# Patient Record
Sex: Female | Born: 1990 | Hispanic: Yes | Marital: Single | Smoking: Never smoker
Health system: Southern US, Community
[De-identification: ages and names within clinical notes are randomized; demographics above are authoritative.]

## PROBLEM LIST (undated history)

## (undated) ENCOUNTER — Inpatient Hospital Stay (HOSPITAL_COMMUNITY): Payer: Self-pay

## (undated) DIAGNOSIS — F53 Postpartum depression: Secondary | ICD-10-CM

## (undated) DIAGNOSIS — J302 Other seasonal allergic rhinitis: Secondary | ICD-10-CM

## (undated) DIAGNOSIS — J45909 Unspecified asthma, uncomplicated: Secondary | ICD-10-CM

## (undated) DIAGNOSIS — R51 Headache: Secondary | ICD-10-CM

## (undated) DIAGNOSIS — D649 Anemia, unspecified: Secondary | ICD-10-CM

## (undated) DIAGNOSIS — A599 Trichomoniasis, unspecified: Secondary | ICD-10-CM

## (undated) DIAGNOSIS — A749 Chlamydial infection, unspecified: Secondary | ICD-10-CM

## (undated) DIAGNOSIS — K219 Gastro-esophageal reflux disease without esophagitis: Secondary | ICD-10-CM

## (undated) HISTORY — PX: NO PAST SURGERIES: SHX2092

## (undated) HISTORY — DX: Postpartum depression: F53.0

## (undated) HISTORY — PX: WISDOM TOOTH EXTRACTION: SHX21

---
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
## 2012-03-24 ENCOUNTER — Emergency Department (HOSPITAL_COMMUNITY): Payer: Self-pay

## 2012-03-24 ENCOUNTER — Encounter (HOSPITAL_COMMUNITY): Payer: Self-pay | Admitting: Emergency Medicine

## 2012-03-24 ENCOUNTER — Emergency Department (HOSPITAL_COMMUNITY)
Admission: EM | Admit: 2012-03-24 | Discharge: 2012-03-24 | Discharge status: Against Medical Advice | Payer: Self-pay | Attending: Emergency Medicine | Admitting: Emergency Medicine

## 2012-03-24 DIAGNOSIS — Z3490 Encounter for supervision of normal pregnancy, unspecified, unspecified trimester: Secondary | ICD-10-CM

## 2012-03-24 DIAGNOSIS — Z711 Person with feared health complaint in whom no diagnosis is made: Secondary | ICD-10-CM

## 2012-03-24 DIAGNOSIS — J45909 Unspecified asthma, uncomplicated: Secondary | ICD-10-CM | POA: Insufficient documentation

## 2012-03-24 DIAGNOSIS — Z7251 High risk heterosexual behavior: Secondary | ICD-10-CM | POA: Insufficient documentation

## 2012-03-24 DIAGNOSIS — N390 Urinary tract infection, site not specified: Secondary | ICD-10-CM

## 2012-03-24 DIAGNOSIS — N898 Other specified noninflammatory disorders of vagina: Secondary | ICD-10-CM | POA: Insufficient documentation

## 2012-03-24 DIAGNOSIS — R300 Dysuria: Secondary | ICD-10-CM | POA: Insufficient documentation

## 2012-03-24 DIAGNOSIS — O2390 Unspecified genitourinary tract infection in pregnancy, unspecified trimester: Secondary | ICD-10-CM | POA: Insufficient documentation

## 2012-03-24 DIAGNOSIS — Z3201 Encounter for pregnancy test, result positive: Secondary | ICD-10-CM | POA: Insufficient documentation

## 2012-03-24 DIAGNOSIS — O9989 Other specified diseases and conditions complicating pregnancy, childbirth and the puerperium: Secondary | ICD-10-CM | POA: Insufficient documentation

## 2012-03-24 HISTORY — DX: Unspecified asthma, uncomplicated: J45.909

## 2012-03-24 LAB — CBC WITH DIFFERENTIAL/PLATELET
Basophils Relative: 0 % (ref 0–1)
HCT: 31.9 % — ABNORMAL LOW (ref 36.0–46.0)
Hemoglobin: 10.2 g/dL — ABNORMAL LOW (ref 12.0–15.0)
Lymphs Abs: 2 10*3/uL (ref 0.7–4.0)
MCH: 24.9 pg — ABNORMAL LOW (ref 26.0–34.0)
MCHC: 32 g/dL (ref 30.0–36.0)
Monocytes Absolute: 0.5 10*3/uL (ref 0.1–1.0)
Monocytes Relative: 7 % (ref 3–12)
Neutro Abs: 4.7 10*3/uL (ref 1.7–7.7)

## 2012-03-24 LAB — URINE MICROSCOPIC-ADD ON

## 2012-03-24 LAB — COMPREHENSIVE METABOLIC PANEL
Albumin: 3.9 g/dL (ref 3.5–5.2)
BUN: 10 mg/dL (ref 6–23)
Chloride: 103 mEq/L (ref 96–112)
Creatinine, Ser: 0.52 mg/dL (ref 0.50–1.10)
GFR calc Af Amer: >90 mL/min (ref >90)
GFR calc non Af Amer: >90 mL/min (ref >90)
Glucose, Bld: 103 mg/dL — ABNORMAL HIGH (ref 70–99)
Total Bilirubin: 0.3 mg/dL (ref 0.3–1.2)

## 2012-03-24 LAB — URINALYSIS, ROUTINE W REFLEX MICROSCOPIC
Bilirubin Urine: NEGATIVE
Glucose, UA: NEGATIVE mg/dL
Ketones, ur: 15 mg/dL — AB
pH: 5.5 (ref 5.0–8.0)

## 2012-03-24 LAB — POCT PREGNANCY, URINE: Preg Test, Ur: POSITIVE — AB

## 2012-03-24 MED ORDER — CEFTRIAXONE SODIUM 250 MG IJ SOLR
250.0000 mg | Freq: Once | INTRAMUSCULAR | Status: AC
  Administered 2012-03-24: 250 mg via INTRAMUSCULAR
  Filled 2012-03-24: qty 250

## 2012-03-24 MED ORDER — PRENATAL COMPLETE 14-0.4 MG PO TABS: 1.0000 | ORAL_TABLET | Freq: Two times a day (BID) | ORAL | Status: DC

## 2012-03-24 MED ORDER — NITROFURANTOIN MONOHYD MACRO 100 MG PO CAPS: 100.0000 mg | ORAL_CAPSULE | Freq: Two times a day (BID) | ORAL | Status: DC

## 2012-03-24 MED ORDER — AZITHROMYCIN 250 MG PO TABS
500.0000 mg | ORAL_TABLET | Freq: Once | ORAL | Status: AC
  Administered 2012-03-24: 500 mg via ORAL
  Filled 2012-03-24: qty 2

## 2012-03-24 NOTE — ED Notes (Signed)
Pt updated on wait times 

## 2012-03-24 NOTE — ED Notes (Signed)
Child given Apple juice and crackers, updated on wait time

## 2012-03-24 NOTE — ED Notes (Signed)
Pt c/o abdominal pain x 1 week. Pt also vaginal irritation onset x [redacted] week along with yellow vaginal discharge.

## 2012-03-24 NOTE — ED Provider Notes (Signed)
Medical screening examination/treatment/procedure(s) were performed by non-physician practitioner and as supervising physician I was immediately available for consultation/collaboration.   Loren Racer, MD 03/24/12 2352

## 2012-03-24 NOTE — ED Provider Notes (Signed)
History     CSN: [id number]  Arrival date & time 03/24/12  1435   First MD Initiated Contact with Patient 03/24/12 1912      Chief Complaint  Patient presents with  . Abdominal Pain    (Consider location/radiation/quality/duration/timing/severity/associated sxs/prior treatment) HPI  Pt to the ER with complaints of low abdominal pain, vaginal discharge and dysuria for 1 week. Pt has irregular periods, last period in November, does not take birth control, does not always use protection with her boyfriend. She denies being pregnant to her knowledge at this time.  Denies N/V/D weakness or fevers. nad vss  Past Medical History  Diagnosis Date  . Asthma     History reviewed. No pertinent past surgical history.  No family history on file.  History  Substance Use Topics  . Smoking status: Never Smoker   . Smokeless tobacco: Not on file  . Alcohol Use: Yes    OB History   Grav Para Term Preterm Abortions TAB SAB Ect Mult Living                  Review of Systems  Review of Systems  Gen: no weight loss, fevers, chills, night sweats  Eyes: no discharge or drainage, no occular pain or visual changes  Nose: no epistaxis or rhinorrhea  Mouth: no dental pain, no sore throat  Neck: no neck pain  Lungs:No wheezing, coughing or hemoptysis CV: no chest pain, palpitations, dependent edema or orthopnea  Abd: no abdominal pain, nausea, vomiting, + abdominal cramping GU: + dysuria and vaginal discharge no gross hematuria  MSK:  No abnormalities  Neuro: no headache, no focal neurologic deficits  Skin: no abnormalities Psyche: negative.   Allergies  Shellfish allergy  Home Medications   Current Outpatient Rx  Name  Route  Sig  Dispense  Refill  . ibuprofen (ADVIL,MOTRIN) 200 MG tablet   Oral   Take 800 mg by mouth every 6 (six) hours as needed for pain. For pain         . nitrofurantoin, macrocrystal-monohydrate, (MACROBID) 100 MG capsule   Oral   Take 1 capsule (100  mg total) by mouth 2 (two) times daily.   10 capsule   0   . Prenatal Vit-Fe Fumarate-FA (PRENATAL COMPLETE) 14-0.4 MG TABS   Oral   Take 1 tablet by mouth 2 (two) times daily.   60 each   2     BP 135/74  Pulse 95  Temp(Src) 98.6 F (37 C) (Oral)  Resp 16  SpO2 100%  LMP 01/13/2012  Physical Exam  Nursing note and vitals reviewed. Constitutional: She appears well-developed and well-nourished. No distress.  HENT:  Head: Normocephalic and atraumatic.  Eyes: Pupils are equal, round, and reactive to light.  Neck: Normal range of motion. Neck supple.  Cardiovascular: Normal rate and regular rhythm.   Pulmonary/Chest: Effort normal.  Abdominal: Soft.    Genitourinary: Uterus is enlarged and tender. Cervix exhibits discharge. Cervix exhibits no motion tenderness and no friability. No bleeding around the vagina. No foreign body around the vagina. Vaginal discharge found.  Neurological: She is alert.  Skin: Skin is warm and dry.    ED Course  Procedures (including critical care time)  Labs Reviewed  URINALYSIS, ROUTINE W REFLEX MICROSCOPIC - Abnormal; Notable for the following:    Color, Urine AMBER (*)    APPearance CLOUDY (*)    Specific Gravity, Urine 1.044 (*)    Ketones, ur 15 (*)    Leukocytes,  UA LARGE (*)    All other components within normal limits  CBC WITH DIFFERENTIAL - Abnormal; Notable for the following:    Hemoglobin 10.2 (*)    HCT 31.9 (*)    MCV 77.8 (*)    MCH 24.9 (*)    All other components within normal limits  COMPREHENSIVE METABOLIC PANEL - Abnormal; Notable for the following:    Potassium 3.4 (*)    Glucose, Bld 103 (*)    All other components within normal limits  URINE MICROSCOPIC-ADD ON - Abnormal; Notable for the following:    Squamous Epithelial / LPF MANY (*)    Bacteria, UA FEW (*)    All other components within normal limits  HCG, QUANTITATIVE, PREGNANCY - Abnormal; Notable for the following:    hCG, Beta Chain, Quant, S 9339 (*)     All other components within normal limits  POCT PREGNANCY, URINE - Abnormal; Notable for the following:    Preg Test, Ur POSITIVE (*)    All other components within normal limits  URINE CULTURE  GC/CHLAMYDIA PROBE AMP  WET PREP, GENITAL   US Ob Comp Less 14 Wks  03/24/2012  *RADIOLOGY REPORT*  Clinical Data: Left abdominal pain, pregnant  OBSTETRIC <14 WK [place] AND TRANSVAGINAL OB US  Technique:  Both transabdominal and transvaginal ultrasound examinations were performed for complete evaluation of the gestation as well as the maternal uterus, adnexal regions, and pelvic cul-de-sac.  Transvaginal technique was performed to assess early pregnancy.  Comparison:  None.  Intrauterine gestational sac:  Visualized/normal in shape. Yolk sac: Present Embryo: Likely present but difficult to discretely measure. Cardiac Activity: Cardiac activity is suspected on cine imaging but could not be confirmed with M-mode to document a FHR.  MSD: 19 mm  6 w 6 d [place] EDC: 11/11/2012  Maternal uterus/adnexae: No subchronic hemorrhage.  Left ovary is within normal limits, measuring 1.4 x 2.9 x 1.7 cm.  Right ovary is within normal limits, measuring 1.3 x 2.5 x 1.5 cm.  Trace pelvic fluid.  IMPRESSION: Single intrauterine gestation with estimated gestational age [redacted] weeks 6 days by mean sac diameter.  A suspected fetal pole is present but is difficult to discretely measure.  Consider follow-up ultrasound in 10-14 days to document viability as clinically warranted.   Original Report Authenticated By: Charline Bills, M.D.    US Ob Transvaginal  03/24/2012  *RADIOLOGY REPORT*  Clinical Data: Left abdominal pain, pregnant  OBSTETRIC <14 WK [place] AND TRANSVAGINAL OB US  Technique:  Both transabdominal and transvaginal ultrasound examinations were performed for complete evaluation of the gestation as well as the maternal uterus, adnexal regions, and pelvic cul-de-sac.  Transvaginal technique was performed to assess early pregnancy.   Comparison:  None.  Intrauterine gestational sac:  Visualized/normal in shape. Yolk sac: Present Embryo: Likely present but difficult to discretely measure. Cardiac Activity: Cardiac activity is suspected on cine imaging but could not be confirmed with M-mode to document a FHR.  MSD: 19 mm  6 w 6 d [place] EDC: 11/11/2012  Maternal uterus/adnexae: No subchronic hemorrhage.  Left ovary is within normal limits, measuring 1.4 x 2.9 x 1.7 cm.  Right ovary is within normal limits, measuring 1.3 x 2.5 x 1.5 cm.  Trace pelvic fluid.  IMPRESSION: Single intrauterine gestation with estimated gestational age [redacted] weeks 6 days by mean sac diameter.  A suspected fetal pole is present but is difficult to discretely measure.  Consider follow-up ultrasound in 10-14 days to document viability as  clinically warranted.   Original Report Authenticated By: Charline Bills, M.D.      1. UTI (lower urinary tract infection)   2. Pregnancy   3. Concern about STD in female without diagnosis       MDM   Agents urine pregnant positive. Ultrasound confirms a 6 week and 6 day IUP. She is positive for urinary tract infection and has significant discharge in her vaginal vault. Patient unwilling to wait results of her wet prep because her mother and sister are waiting for her and getting antsy in the waiting room. I discussed with her the risks but she still wants to leave. I advised her that her insurance may not pay for the she signed out AMA she still wants to leave.  Terramycin and Rocephin given in the emergency department and a prescription for prenatal vitamins and Macrobid given as prescriptions. Urine culture sent out.  Pt has been advised of the symptoms that warrant their return to the ED. Patient has voiced understanding and has agreed to follow-up with the PCP or specialist.     10:10pm- pt left after receiving rocephin and azithromycin in ED but did not take any discharge paper work Rx for abx and prenatals that I  prescribed. She is concerned about her mom finding out she is pregnant. Had mentioned in the room earlier that she may try to come back tomorrow for results.    Dorthula Matas, PA 03/24/12 2211

## 2012-03-24 NOTE — ED Notes (Signed)
Patient transported to Ultrasound 

## 2012-03-24 NOTE — ED Notes (Signed)
Pt states that she has to leave right now and cannot wait for her results. Pt reports that she will return tomorrow. Pt informed of risks of leaving and states she understands Pt dressed and refused last set of v/s saying that her ride would leave if she did not go now.

## 2012-03-26 LAB — URINE CULTURE: Colony Count: 30000

## 2012-03-27 LAB — GC/CHLAMYDIA PROBE AMP
CT Probe RNA: NEGATIVE
GC Probe RNA: NEGATIVE

## 2012-04-03 ENCOUNTER — Emergency Department (HOSPITAL_COMMUNITY)
Admission: EM | Admit: 2012-04-03 | Discharge: 2012-04-03 | Disposition: A | Discharge status: Home / Self Care | Payer: Self-pay | Attending: Emergency Medicine | Admitting: Emergency Medicine

## 2012-04-03 ENCOUNTER — Encounter (HOSPITAL_COMMUNITY): Payer: Self-pay | Admitting: Emergency Medicine

## 2012-04-03 DIAGNOSIS — N898 Other specified noninflammatory disorders of vagina: Secondary | ICD-10-CM | POA: Insufficient documentation

## 2012-04-03 DIAGNOSIS — N939 Abnormal uterine and vaginal bleeding, unspecified: Secondary | ICD-10-CM

## 2012-04-03 DIAGNOSIS — O2390 Unspecified genitourinary tract infection in pregnancy, unspecified trimester: Secondary | ICD-10-CM | POA: Insufficient documentation

## 2012-04-03 DIAGNOSIS — O9989 Other specified diseases and conditions complicating pregnancy, childbirth and the puerperium: Secondary | ICD-10-CM | POA: Insufficient documentation

## 2012-04-03 DIAGNOSIS — R109 Unspecified abdominal pain: Secondary | ICD-10-CM | POA: Insufficient documentation

## 2012-04-03 DIAGNOSIS — R110 Nausea: Secondary | ICD-10-CM | POA: Insufficient documentation

## 2012-04-03 DIAGNOSIS — O209 Hemorrhage in early pregnancy, unspecified: Secondary | ICD-10-CM | POA: Insufficient documentation

## 2012-04-03 DIAGNOSIS — Z792 Long term (current) use of antibiotics: Secondary | ICD-10-CM | POA: Insufficient documentation

## 2012-04-03 DIAGNOSIS — A599 Trichomoniasis, unspecified: Secondary | ICD-10-CM | POA: Insufficient documentation

## 2012-04-03 DIAGNOSIS — O99330 Smoking (tobacco) complicating pregnancy, unspecified trimester: Secondary | ICD-10-CM | POA: Insufficient documentation

## 2012-04-03 DIAGNOSIS — E669 Obesity, unspecified: Secondary | ICD-10-CM | POA: Insufficient documentation

## 2012-04-03 DIAGNOSIS — J45909 Unspecified asthma, uncomplicated: Secondary | ICD-10-CM | POA: Insufficient documentation

## 2012-04-03 LAB — COMPREHENSIVE METABOLIC PANEL
ALT: 11 U/L (ref 0–35)
AST: 15 U/L (ref 0–37)
Albumin: 4.1 g/dL (ref 3.5–5.2)
CO2: 23 mEq/L (ref 19–32)
Chloride: 101 mEq/L (ref 96–112)
GFR calc non Af Amer: >90 mL/min (ref >90)
Sodium: 137 mEq/L (ref 135–145)
Total Bilirubin: 0.4 mg/dL (ref 0.3–1.2)

## 2012-04-03 LAB — CBC WITH DIFFERENTIAL/PLATELET
Eosinophils Absolute: 0.1 10*3/uL (ref 0.0–0.7)
Eosinophils Relative: 1 % (ref 0–5)
HCT: 34.2 % — ABNORMAL LOW (ref 36.0–46.0)
Lymphocytes Relative: 34 % (ref 12–46)
Lymphs Abs: 2.2 10*3/uL (ref 0.7–4.0)
MCH: 25.4 pg — ABNORMAL LOW (ref 26.0–34.0)
MCV: 77.6 fL — ABNORMAL LOW (ref 78.0–100.0)
Monocytes Absolute: 0.4 10*3/uL (ref 0.1–1.0)
Platelets: 260 10*3/uL (ref 150–400)
RBC: 4.41 MIL/uL (ref 3.87–5.11)
WBC: 6.6 10*3/uL (ref 4.0–10.5)

## 2012-04-03 LAB — URINALYSIS, ROUTINE W REFLEX MICROSCOPIC
Glucose, UA: NEGATIVE mg/dL
Specific Gravity, Urine: 1.025 (ref 1.005–1.030)
pH: 5.5 (ref 5.0–8.0)

## 2012-04-03 LAB — WET PREP, GENITAL: Yeast Wet Prep HPF POC: NONE SEEN

## 2012-04-03 LAB — URINE MICROSCOPIC-ADD ON

## 2012-04-03 LAB — PREGNANCY, URINE: Preg Test, Ur: POSITIVE — AB

## 2012-04-03 LAB — HCG, QUANTITATIVE, PREGNANCY: hCG, Beta Chain, Quant, S: 4048 m[IU]/mL — ABNORMAL HIGH (ref <5)

## 2012-04-03 MED ORDER — METRONIDAZOLE 500 MG PO TABS: 500.0000 mg | ORAL_TABLET | Freq: Two times a day (BID) | ORAL | Status: DC

## 2012-04-03 NOTE — ED Notes (Signed)
Pt. Stated, I've been having abd. Pain for over a week since last week, came here last week but I was not able to stay cause I didn't have a ride.  I m pregnant but never seen a Dr. Maurice Small Nov.28, I started bleeding Sunday like I was having a period.

## 2012-04-03 NOTE — ED Provider Notes (Signed)
I have personally seen and examined the patient.  I have discussed the plan of care with the resident.  I have reviewed the documentation on PMH/FH/Soc. History.  I have reviewed the documentation of the resident and agree.  Pt well appearing, using phone and in no distress, abdominal exam benign.  Suspect threatened miscarriage She has already had IUP confirmed by [place]  Joya Gaskins, MD 04/03/12 1141

## 2012-04-03 NOTE — ED Provider Notes (Signed)
History     CSN: [id number]  Arrival date & time 04/03/12  5284   First MD Initiated Contact with Patient 04/03/12 [phone]      Chief Complaint  Patient presents with  . Abdominal Pain    (Consider location/radiation/quality/duration/timing/severity/associated sxs/prior treatment) HPI Comments: 22 y.o G1P0 presents for 2 days of vaginal bleeding (2/17 bleeding heavy needed to wear a pad, 2/18 spotting).  LMP 12/2011 with h/o irregular menses.  Having unprotected sex with 1 partner boyfriend.  Visited ED 2/8 found to be 6 weeks 6 days pregnancy with quant >9K.  Also having left upper quadrant pain 6/10 and periumbilical pain.  Wet prep 2/8 +few yeast, +trichomonas (was not treated at that visit).  She was given rocephin and AZM 2/8 and Macrobid for suspected UTI.    PMH: asthma, allergies [facility] none SH: in college at [employer]; wants to do social work and transfer to [employer]  The history is provided by the patient. No language interpreter was used.    Past Medical History  Diagnosis Date  . Asthma     History reviewed. No pertinent past surgical history.  No family history on file.  History  Substance Use Topics  . Smoking status: Never Smoker   . Smokeless tobacco: Not on file  . Alcohol Use: Yes    OB History   Grav Para Term Preterm Abortions TAB SAB Ect Mult Living                  Review of Systems  Constitutional: Negative for fever and appetite change.  Gastrointestinal: Positive for nausea and abdominal pain.  Genitourinary: Positive for vaginal bleeding.  All other systems reviewed and are negative.    Allergies  Shellfish allergy  Home Medications   Current Outpatient Rx  Name  Route  Sig  Dispense  Refill  . acetaminophen (TYLENOL) 500 MG tablet   Oral   Take 500 mg by mouth every 6 (six) hours as needed (for headache).         . metroNIDAZOLE (FLAGYL) 500 MG tablet   Oral   Take 1 tablet (500 mg total) by mouth 2 (two) times daily.   14 tablet    0     BP 120/66  Pulse 84  Temp(Src) 98.3 F (36.8 C) (Oral)  Resp 15  SpO2 100%  LMP 01/13/2012  Physical Exam  Nursing note and vitals reviewed. Constitutional: She is oriented to person, place, and time. Vital signs are normal. She appears well-developed and well-nourished. She is cooperative. No distress.  HENT:  Head: Normocephalic and atraumatic.  Mouth/Throat: Oropharynx is clear and moist and mucous membranes are normal. No oropharyngeal exudate.  Eyes: Conjunctivae are normal. Pupils are equal, round, and reactive to light. Right eye exhibits no discharge. Left eye exhibits no discharge. No scleral icterus.  Cardiovascular: Normal rate, regular rhythm, S1 normal, S2 normal and normal heart sounds.   No murmur heard. Pulmonary/Chest: Effort normal and breath sounds normal. No respiratory distress. She has no wheezes.  Abdominal: Soft. Bowel sounds are normal. There is tenderness in the left upper quadrant.  Mild ttp LUQ Obese ab  Genitourinary: Pelvic exam was performed with patient supine. There is no lesion on the right labia. There is no lesion on the left labia. Cervix exhibits motion tenderness and discharge. Cervix exhibits no friability.  Closed os Min. Blood coming from os  Fishy odor  Clear thin d/c from os   Musculoskeletal: She exhibits no edema.  Neurological: She is alert and oriented to person, place, and time. She has normal strength.  Skin: Skin is warm, dry and intact. No rash noted. She is not diaphoretic.  Psychiatric: She has a normal mood and affect. Her speech is normal and behavior is normal. Judgment and thought content normal. Cognition and memory are normal.    ED Course  Procedures (including critical care time)  Labs Reviewed  WET PREP, GENITAL - Abnormal; Notable for the following:    Trich, Wet Prep TOO NUMEROUS TO COUNT (*)    WBC, Wet Prep HPF POC TOO NUMEROUS TO COUNT (*)    All other components within normal limits  URINALYSIS,  ROUTINE W REFLEX MICROSCOPIC - Abnormal; Notable for the following:    APPearance HAZY (*)    Hgb urine dipstick LARGE (*)    Leukocytes, UA MODERATE (*)    All other components within normal limits  PREGNANCY, URINE - Abnormal; Notable for the following:    Preg Test, Ur POSITIVE (*)    All other components within normal limits  CBC WITH DIFFERENTIAL - Abnormal; Notable for the following:    Hemoglobin 11.2 (*)    HCT 34.2 (*)    MCV 77.6 (*)    MCH 25.4 (*)    RDW 15.6 (*)    All other components within normal limits  URINE MICROSCOPIC-ADD ON - Abnormal; Notable for the following:    Squamous Epithelial / LPF MANY (*)    Bacteria, UA FEW (*)    All other components within normal limits  GC/CHLAMYDIA PROBE AMP  URINE CULTURE  COMPREHENSIVE METABOLIC PANEL  LIPASE, BLOOD  HCG, QUANTITATIVE, PREGNANCY  ABO/RH   No results found.   1. Vaginal bleeding   2. Trichomonas   3. Abdominal pain       MDM  Wet prep,GC, Rh factor, beta hcg Partner needs to be treated  Will treat trich Flagyl 500 bid x 7 days per Dr[facility] for beta quant F/u Dr. Ladean Raya- call [phone]  [facility] MD [phone]        Annett Gula, MD 04/03/12 1049  Annett Gula, MD 04/03/12 [phone]

## 2012-04-04 LAB — GC/CHLAMYDIA PROBE AMP
CT Probe RNA: NEGATIVE
GC Probe RNA: NEGATIVE

## 2012-04-05 ENCOUNTER — Inpatient Hospital Stay (HOSPITAL_COMMUNITY)
Admission: AD | Admit: 2012-04-05 | Discharge: 2012-04-05 | Disposition: A | Discharge status: Home / Self Care | Payer: Self-pay | Source: Ambulatory Visit | Attending: Obstetrics & Gynecology | Admitting: Obstetrics & Gynecology

## 2012-04-05 ENCOUNTER — Encounter (HOSPITAL_COMMUNITY): Payer: Self-pay | Admitting: *Deleted

## 2012-04-05 DIAGNOSIS — O030 Genital tract and pelvic infection following incomplete spontaneous abortion: Secondary | ICD-10-CM | POA: Insufficient documentation

## 2012-04-05 DIAGNOSIS — A5901 Trichomonal vulvovaginitis: Secondary | ICD-10-CM

## 2012-04-05 DIAGNOSIS — O032 Embolism following incomplete spontaneous abortion: Secondary | ICD-10-CM

## 2012-04-05 HISTORY — DX: Other seasonal allergic rhinitis: J30.2

## 2012-04-05 LAB — HCG, QUANTITATIVE, PREGNANCY: hCG, Beta Chain, Quant, S: 1238 m[IU]/mL — ABNORMAL HIGH (ref <5)

## 2012-04-05 MED ORDER — MISOPROSTOL 200 MCG PO TABS: 200.0000 ug | ORAL_TABLET | ORAL | Status: DC

## 2012-04-05 MED ORDER — OXYCODONE-ACETAMINOPHEN 5-325 MG PO TABS
1.0000 | ORAL_TABLET | Freq: Once | ORAL | Status: AC
  Administered 2012-04-05: 1 via ORAL
  Filled 2012-04-05: qty 1

## 2012-04-05 MED ORDER — IBUPROFEN 600 MG PO TABS: 600.0000 mg | ORAL_TABLET | Freq: Four times a day (QID) | ORAL | Status: DC | PRN

## 2012-04-05 MED ORDER — OXYCODONE-ACETAMINOPHEN 5-325 MG PO TABS: 1.0000 | ORAL_TABLET | Freq: Four times a day (QID) | ORAL | Status: DC | PRN

## 2012-04-05 MED ORDER — OXYCODONE-ACETAMINOPHEN 5-325 MG PO TABS: 1.0000 | ORAL_TABLET | ORAL | Status: DC | PRN

## 2012-04-05 NOTE — MAU Note (Signed)
Pt states was seen at ER 2 days ago, here for increased pain and bleeding. Has not filled rx for treatment of STI, states will fill today.

## 2012-04-05 NOTE — MAU Provider Note (Signed)
Chief Complaint  Patient presents with  . Vaginal Bleeding    Subjective Lindsay Perez 22 y.o.  G1P0 at [redacted]w[redacted]d by LMP presents with onset 4 d ago of first episode of small amount pink vaginal bleeding. Progressively redder and heavier, using 4 pads per day. Seen at [facility] for abd pain 12 d ago with US showing IUP [redacted]w[redacted]d with "suspected" fetal pole, no definite HR. Seen again 2 days ago at [facility]. Quant was 4048, Opos, GC/CT neg.Hgb 11.2, CMP normal.  WP pos for trich (not yet treated). Has menstrual-like crampy lower abdominal pain, also getting worse today. No dysuria or hematuria.   Problem list, past medical history, Ob/Gyn history, surgical history, family history and social history reviewed and updated as appropriate. Pertinent Medical History:asthma, trich Pertinent Ob/Gyn History: G1 NPC Pertinent Surgical History: None  Prescriptions prior to admission  Medication Sig Dispense Refill  . acetaminophen (TYLENOL) 500 MG tablet Take 500 mg by mouth every 6 (six) hours as needed (for headache).      Marland Kitchen albuterol (PROVENTIL HFA;VENTOLIN HFA) 108 (90 BASE) MCG/ACT inhaler Inhale 2 puffs into the lungs every 4 (four) hours as needed for wheezing.      Marland Kitchen aspirin 325 MG tablet Take 650 mg by mouth every 4 (four) hours as needed for pain.      Marland Kitchen OVER THE COUNTER MEDICATION Apply 1 application topically 2 (two) times daily as needed. Pt stated the she used a gel for tooth pain a few times        Allergies  Allergen Reactions  . Shellfish Allergy Swelling     Objective   Filed Vitals:   04/05/12 0945  BP: 116/65  Pulse: 68  Temp: 98.4 F (36.9 C)  Resp: 16     Physical Exam General: WN/WD in NAD  Abdom: soft, NT External genitalia: normal; BUS neg  SSE: small amount blood; cervix with small clot spontaneously extruding from os; no lesions, small BRB Bimanual: Cervix ftp ext ,uterus sl tender and 4-6 wk size adnexa nontender, no masses   Lab Results No results found for this or  any previous visit (from the past 24 hour(s)). Results for orders placed during the hospital encounter of 04/05/12 (from the past 24 hour(s))  HCG, QUANTITATIVE, PREGNANCY     Status: Abnormal   Collection Time    04/05/12 11:00 AM      Result Value Range   hCG, Beta Chain, Quant, S 1238 (*) <5 mIU/mL   Ultrasound  No results found.  Assessment 1. Incomplete miscarriage with blood clot   2. Trichomonal vaginitis      Plan    D/W Dr. Debroah Loop offer cytotec or expectant management. Couple elects cytotec Discharge home See AVS for pt education   Medication List    STOP taking these medications       acetaminophen 500 MG tablet  Commonly known as:  TYLENOL     aspirin 325 MG tablet      TAKE these medications       albuterol 108 (90 BASE) MCG/ACT inhaler  Commonly known as:  PROVENTIL HFA;VENTOLIN HFA  Inhale 2 puffs into the lungs every 4 (four) hours as needed for wheezing.     ibuprofen 600 MG tablet  Commonly known as:  ADVIL,MOTRIN  Take 1 tablet (600 mg total) by mouth every 6 (six) hours as needed for pain.     misoprostol 200 MCG tablet  Commonly known as:  CYTOTEC  Take 1 tablet (200 mcg  total) by mouth 1 day or 1 dose.     OVER THE COUNTER MEDICATION  Apply 1 application topically 2 (two) times daily as needed. Pt stated the she used a gel for tooth pain a few times     oxyCODONE-acetaminophen 5-325 MG per tablet  Commonly known as:  PERCOCET/ROXICET  Take 1 tablet by mouth every 4 (four) hours as needed for pain.       Fill Rx for trichimonas and take as directed Some from [facility] hospital GYN clinic will call you with an appointment for followup.  Lindsay Perez 04/05/2012 11:00 AM

## 2012-04-10 ENCOUNTER — Encounter: Payer: Self-pay | Admitting: Medical

## 2012-04-20 ENCOUNTER — Encounter: Payer: Self-pay | Admitting: Medical

## 2012-04-20 ENCOUNTER — Telehealth: Payer: Self-pay | Admitting: Medical

## 2012-04-20 NOTE — Telephone Encounter (Signed)
Called patient to see if she was coming to her appointment today because of bad weather. Patient stated she has classes, and would call back at a later time.

## 2012-09-12 ENCOUNTER — Encounter (HOSPITAL_COMMUNITY): Payer: Self-pay | Admitting: *Deleted

## 2012-09-12 ENCOUNTER — Inpatient Hospital Stay (HOSPITAL_COMMUNITY)
Admission: AD | Admit: 2012-09-12 | Discharge: 2012-09-12 | Disposition: A | Discharge status: Home / Self Care | Payer: Self-pay | Source: Ambulatory Visit | Attending: Obstetrics & Gynecology | Admitting: Obstetrics & Gynecology

## 2012-09-12 DIAGNOSIS — R1013 Epigastric pain: Secondary | ICD-10-CM | POA: Insufficient documentation

## 2012-09-12 DIAGNOSIS — K219 Gastro-esophageal reflux disease without esophagitis: Secondary | ICD-10-CM | POA: Insufficient documentation

## 2012-09-12 HISTORY — DX: Anemia, unspecified: D64.9

## 2012-09-12 HISTORY — DX: Headache: R51

## 2012-09-12 LAB — URINALYSIS, ROUTINE W REFLEX MICROSCOPIC
Leukocytes, UA: NEGATIVE
Nitrite: NEGATIVE
Specific Gravity, Urine: 1.02 (ref 1.005–1.030)
pH: 6.5 (ref 5.0–8.0)

## 2012-09-12 MED ORDER — GI COCKTAIL ~~LOC~~
30.0000 mL | Freq: Once | ORAL | Status: AC
  Administered 2012-09-12: 30 mL via ORAL
  Filled 2012-09-12: qty 30

## 2012-09-12 MED ORDER — OMEPRAZOLE 20 MG PO CPDR: 20.0000 mg | DELAYED_RELEASE_CAPSULE | Freq: Every day | ORAL | Status: DC

## 2012-09-12 NOTE — MAU Note (Signed)
Pain in left upper quad, cramping, comes and goes for past 2 wks.  Sometimes in LLQ.

## 2012-09-12 NOTE — MAU Provider Note (Signed)
History     CSN: [id number]  Arrival date and time: 09/12/12 1118   First Provider Initiated Contact with Patient 09/12/12 1229      Chief Complaint  Patient presents with  . Abdominal Pain   HPI [name] is a 22 y.o. G1P0010 who presents to MAU today today with epigastric pain x 2 weeks. The pain comes and goes. She rates it at 5/10 now and 9/10 at the worst. She has had occasional N/V last week, but none since. She had loose stools a few days ago, but none since. She denies fever or heartburn. She states that she has been eating a lot of spicy foods because she works at [employer] that has a lot of "buffalo food."   OB History   Grav Para Term Preterm Abortions TAB SAB Ect Mult Living   1    1  1    0      Past Medical History  Diagnosis Date  . Asthma   . Seasonal allergies   . Headache(784.0)     migranes  . Anemia     Past Surgical History  Procedure Laterality Date  . No past surgeries      No family history on file.  History  Substance Use Topics  . Smoking status: Never Smoker   . Smokeless tobacco: Never Used  . Alcohol Use: Yes     Comment: every day use    Allergies:  Allergies  Allergen Reactions  . Shellfish Allergy Swelling    Prescriptions prior to admission  Medication Sig Dispense Refill  . albuterol (PROVENTIL HFA;VENTOLIN HFA) 108 (90 BASE) MCG/ACT inhaler Inhale 2 puffs into the lungs every 4 (four) hours as needed for wheezing.      Marland Kitchen ibuprofen (ADVIL,MOTRIN) 600 MG tablet Take 1 tablet (600 mg total) by mouth every 6 (six) hours as needed for pain.  30 tablet  1  . misoprostol (CYTOTEC) 200 MCG tablet Take 1 tablet (200 mcg total) by mouth 1 day or 1 dose.  4 tablet  0  . OVER THE COUNTER MEDICATION Apply 1 application topically 2 (two) times daily as needed. Pt stated the she used a gel for tooth pain a few times      . oxyCODONE-acetaminophen (PERCOCET/ROXICET) 5-325 MG per tablet Take 1 tablet by mouth every 4 (four)  hours as needed for pain.  20 tablet  0    Review of Systems  Constitutional: Negative for fever and malaise/fatigue.  Gastrointestinal: Positive for nausea, vomiting, abdominal pain and diarrhea. Negative for heartburn and constipation.   Physical Exam   Blood pressure 107/64, pulse 67, temperature 98.1 F (36.7 C), temperature source Oral, resp. rate 18, height 5\' 1"  (1.549 m), weight 210 lb (95.255 kg), unknown if currently breastfeeding.  Physical Exam  Constitutional: She is oriented to person, place, and time. She appears well-developed and well-nourished. No distress.  HENT:  Head: Normocephalic and atraumatic.  Cardiovascular: Normal rate, regular rhythm and normal heart sounds.   Respiratory: Effort normal and breath sounds normal. No respiratory distress.  GI: Soft. Bowel sounds are normal. She exhibits no distension and no mass. There is tenderness (mild tenderness to palpation of the epigrastric region). There is no rebound and no guarding.  Neurological: She is alert and oriented to person, place, and time.  Skin: Skin is warm and dry. No erythema.  Psychiatric: She has a normal mood and affect.   Results for orders placed during the hospital encounter of  09/12/12 (from the past 24 hour(s))  URINALYSIS, ROUTINE W REFLEX MICROSCOPIC     Status: None   Collection Time    09/12/12 11:50 AM      Result Value Range   Color, Urine YELLOW  YELLOW   APPearance CLEAR  CLEAR   Specific Gravity, Urine 1.020  1.005 - 1.030   pH 6.5  5.0 - 8.0   Glucose, UA NEGATIVE  NEGATIVE mg/dL   Hgb urine dipstick NEGATIVE  NEGATIVE   Bilirubin Urine NEGATIVE  NEGATIVE   Ketones, ur NEGATIVE  NEGATIVE mg/dL   Protein, ur NEGATIVE  NEGATIVE mg/dL   Urobilinogen, UA 0.2  0.0 - 1.0 mg/dL   Nitrite NEGATIVE  NEGATIVE   Leukocytes, UA NEGATIVE  NEGATIVE  POCT PREGNANCY, URINE     Status: None   Collection Time    09/12/12 11:51 AM      Result Value Range   Preg Test, Ur NEGATIVE  NEGATIVE      MAU Course  Procedures None  MDM GI cocktail today - patient reports improvement in symptoms  Assessment and Plan  A: GERD  P: Discharge home Rx for Prilosec #14 sent to patient's pharmacy Patient advised to follow-up with a PCP on the list on AVS if symptoms persist AVS also contains GERD diet information Patient may return to MAU as needed or if her condition were to change or worsen  Freddi Starr, PA-C  09/12/2012, 12:29 PM

## 2013-02-05 ENCOUNTER — Inpatient Hospital Stay (HOSPITAL_COMMUNITY)
Admission: AD | Admit: 2013-02-05 | Discharge: 2013-02-05 | Disposition: A | Discharge status: Home / Self Care | Payer: Self-pay | Source: Ambulatory Visit | Attending: Obstetrics & Gynecology | Admitting: Obstetrics & Gynecology

## 2013-02-05 ENCOUNTER — Encounter (HOSPITAL_COMMUNITY): Payer: Self-pay

## 2013-02-05 DIAGNOSIS — D509 Iron deficiency anemia, unspecified: Secondary | ICD-10-CM | POA: Insufficient documentation

## 2013-02-05 DIAGNOSIS — B9689 Other specified bacterial agents as the cause of diseases classified elsewhere: Secondary | ICD-10-CM

## 2013-02-05 DIAGNOSIS — R109 Unspecified abdominal pain: Secondary | ICD-10-CM | POA: Insufficient documentation

## 2013-02-05 DIAGNOSIS — N760 Acute vaginitis: Secondary | ICD-10-CM

## 2013-02-05 DIAGNOSIS — A499 Bacterial infection, unspecified: Secondary | ICD-10-CM

## 2013-02-05 LAB — WET PREP, GENITAL
Trich, Wet Prep: NONE SEEN
Yeast Wet Prep HPF POC: NONE SEEN

## 2013-02-05 LAB — URINALYSIS, ROUTINE W REFLEX MICROSCOPIC
Leukocytes, UA: NEGATIVE
Protein, ur: NEGATIVE mg/dL
Urobilinogen, UA: 0.2 mg/dL (ref 0.0–1.0)

## 2013-02-05 LAB — CBC
HCT: 32.9 % — ABNORMAL LOW (ref 36.0–46.0)
MCH: 24.7 pg — ABNORMAL LOW (ref 26.0–34.0)
MCHC: 32.2 g/dL (ref 30.0–36.0)
RDW: 14.8 % (ref 11.5–15.5)

## 2013-02-05 LAB — POCT PREGNANCY, URINE: Preg Test, Ur: NEGATIVE

## 2013-02-05 MED ORDER — METRONIDAZOLE 500 MG PO TABS: 500.0000 mg | ORAL_TABLET | Freq: Two times a day (BID) | ORAL | Status: DC
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
## 2013-02-06 LAB — GC/CHLAMYDIA PROBE AMP: GC Probe RNA: NEGATIVE

## 2013-02-06 NOTE — MAU Provider Note (Signed)
Attestation of Attending Supervision of Advanced Practitioner (PA/CNM/NP): Evaluation and management procedures were performed by the Advanced Practitioner under my supervision and collaboration.  I have reviewed the Advanced Practitioner's note and chart, and I agree with the management and plan.  Bohdi Leeds, MD, FACOG Attending Obstetrician & Gynecologist Faculty Practice, [facility]  

## 2013-03-11 ENCOUNTER — Inpatient Hospital Stay (HOSPITAL_COMMUNITY)
Admission: AD | Admit: 2013-03-11 | Discharge: 2013-03-11 | Disposition: A | Discharge status: Home / Self Care | Payer: Self-pay | Source: Ambulatory Visit | Attending: Obstetrics & Gynecology | Admitting: Obstetrics & Gynecology

## 2013-03-11 ENCOUNTER — Encounter (HOSPITAL_COMMUNITY): Payer: Self-pay | Admitting: *Deleted

## 2013-03-11 DIAGNOSIS — R319 Hematuria, unspecified: Secondary | ICD-10-CM | POA: Insufficient documentation

## 2013-03-11 DIAGNOSIS — R300 Dysuria: Secondary | ICD-10-CM

## 2013-03-11 LAB — URINALYSIS, ROUTINE W REFLEX MICROSCOPIC
BILIRUBIN URINE: NEGATIVE
GLUCOSE, UA: NEGATIVE mg/dL
KETONES UR: NEGATIVE mg/dL
Nitrite: NEGATIVE
PH: 6 (ref 5.0–8.0)
Protein, ur: NEGATIVE mg/dL
Specific Gravity, Urine: 1.02 (ref 1.005–1.030)
Urobilinogen, UA: 0.2 mg/dL (ref 0.0–1.0)

## 2013-03-11 LAB — WET PREP, GENITAL
Clue Cells Wet Prep HPF POC: NONE SEEN
Trich, Wet Prep: NONE SEEN
YEAST WET PREP: NONE SEEN

## 2013-03-11 LAB — URINE MICROSCOPIC-ADD ON

## 2013-03-11 LAB — POCT PREGNANCY, URINE: Preg Test, Ur: NEGATIVE

## 2013-03-11 NOTE — Discharge Instructions (Signed)

## 2013-03-11 NOTE — MAU Note (Signed)
Patient states she started having pain with urination about 2 weeks ago and saw blood in urine today. Was treated for a UTI in December.

## 2013-03-11 NOTE — MAU Provider Note (Signed)
History     CSN: [id number]  Arrival date and time: 03/11/13 1347   First Provider Initiated Contact with Patient 03/11/13 1506      Chief Complaint  Patient presents with  . Dysuria  . Hematuria   Dysuria  Associated symptoms include hematuria.  Hematuria Associated symptoms include dysuria.    Lindsay Perez is a 23 y.o. G1P0010 who presents today with dysuria x 2 weeks. She states that when she voids she feels a lot of pressure, and then it goes away. She also reports vaginal discharge x 2 weeks as well. She states that it has not had an odor or any irritation.   Past Medical History  Diagnosis Date  . Asthma   . Seasonal allergies   . Headache(784.0)     migranes  . Anemia     Past Surgical History  Procedure Laterality Date  . No past surgeries      History reviewed. No pertinent family history.  History  Substance Use Topics  . Smoking status: Never Smoker   . Smokeless tobacco: Never Used  . Alcohol Use: 11.4 oz/week    14 Shots of liquor, 5 Glasses of wine per week     Comment: every day use    Allergies:  Allergies  Allergen Reactions  . Shellfish Allergy Swelling    Prescriptions prior to admission  Medication Sig Dispense Refill  . acetaminophen (TYLENOL) 500 MG tablet Take 1,000-1,500 mg by mouth every 6 (six) hours as needed for mild pain.       Marland Kitchen. albuterol (PROVENTIL HFA;VENTOLIN HFA) 108 (90 BASE) MCG/ACT inhaler Inhale 2 puffs into the lungs every 4 (four) hours as needed for wheezing.      Marland Kitchen. aspirin 325 MG tablet Take 975 mg by mouth 2 (two) times daily as needed for headache.      . Doxylamine Succinate, Sleep, (SLEEP AID PO) Take 2 tablets by mouth at bedtime as needed.      Marland Kitchen. ibuprofen (ADVIL,MOTRIN) 200 MG tablet Take 600 mg by mouth every 6 (six) hours as needed for headache or mild pain.      . naproxen sodium (ALEVE) 220 MG tablet Take 440 mg by mouth daily as needed (For headache.).      Marland Kitchen. Soft Lens Products (CLEAR EYES CONTACT  LENS RELIEF) SOLN Place 2 drops into both eyes daily as needed (For eye dryness due to contact lenses.).        Review of Systems  Genitourinary: Positive for dysuria and hematuria.   Physical Exam   Blood pressure 135/85, pulse 70, temperature 98.8 F (37.1 C), temperature source Oral, resp. rate 16, height 5' 1.5" (1.562 m), weight 94.711 kg (208 lb 12.8 oz), last menstrual period 03/02/2013, SpO2 99.00%.  Physical Exam  Nursing note and vitals reviewed. Constitutional: She is oriented to person, place, and time. She appears well-developed and well-nourished. No distress.  Cardiovascular: Normal rate.   Respiratory: Effort normal.  GI: Soft. There is no tenderness.  Genitourinary:   External: no lesion Vagina: small amount of white discharge Cervix: pink, smooth, no CMT Uterus: NSSC Adnexa: NT   Neurological: She is alert and oriented to person, place, and time.  Skin: Skin is warm and dry.  Psychiatric: She has a normal mood and affect.    MAU Course  Procedures  Results for orders placed during the hospital encounter of 03/11/13 (from the past 24 hour(s))  URINALYSIS, ROUTINE W REFLEX MICROSCOPIC     Status: Abnormal  Collection Time    03/11/13  2:20 PM      Result Value Range   Color, Urine YELLOW  YELLOW   APPearance HAZY (*) CLEAR   Specific Gravity, Urine 1.020  1.005 - 1.030   pH 6.0  5.0 - 8.0   Glucose, UA NEGATIVE  NEGATIVE mg/dL   Hgb urine dipstick TRACE (*) NEGATIVE   Bilirubin Urine NEGATIVE  NEGATIVE   Ketones, ur NEGATIVE  NEGATIVE mg/dL   Protein, ur NEGATIVE  NEGATIVE mg/dL   Urobilinogen, UA 0.2  0.0 - 1.0 mg/dL   Nitrite NEGATIVE  NEGATIVE   Leukocytes, UA SMALL (*) NEGATIVE  URINE MICROSCOPIC-ADD ON     Status: Abnormal   Collection Time    03/11/13  2:20 PM      Result Value Range   Squamous Epithelial / LPF MANY (*) RARE   WBC, UA 21-50  <3 WBC/hpf   RBC / HPF 0-2  <3 RBC/hpf  POCT PREGNANCY, URINE     Status: None   Collection Time     03/11/13  2:23 PM      Result Value Range   Preg Test, Ur NEGATIVE  NEGATIVE  WET PREP, GENITAL     Status: Abnormal   Collection Time    03/11/13  3:12 PM      Result Value Range   Yeast Wet Prep HPF POC NONE SEEN  NONE SEEN   Trich, Wet Prep NONE SEEN  NONE SEEN   Clue Cells Wet Prep HPF POC NONE SEEN  NONE SEEN   WBC, Wet Prep HPF POC FEW (*) NONE SEEN    Assessment and Plan   1. Dysuria    GC/CT pending Urine Culture pending    Medication List         acetaminophen 500 MG tablet  Commonly known as:  TYLENOL  Take 1,000-1,500 mg by mouth every 6 (six) hours as needed for mild pain.     albuterol 108 (90 BASE) MCG/ACT inhaler  Commonly known as:  PROVENTIL HFA;VENTOLIN HFA  Inhale 2 puffs into the lungs every 4 (four) hours as needed for wheezing.     ALEVE 220 MG tablet  Generic drug:  naproxen sodium  Take 440 mg by mouth daily as needed (For headache.).     aspirin 325 MG tablet  Take 975 mg by mouth 2 (two) times daily as needed for headache.     CLEAR EYES CONTACT LENS RELIEF Soln  Place 2 drops into both eyes daily as needed (For eye dryness due to contact lenses.).     ibuprofen 200 MG tablet  Commonly known as:  ADVIL,MOTRIN  Take 600 mg by mouth every 6 (six) hours as needed for headache or mild pain.     SLEEP AID PO  Take 2 tablets by mouth at bedtime as needed.       Follow-up Information   Follow up with [facility] [place] [facility]. (As needed)    Contact information:   [address] [id number] [place] [address] [phone]       Tawnya Crook 03/11/2013, 3:39 PM

## 2013-03-12 LAB — URINE CULTURE
CULTURE: NO GROWTH
Colony Count: NO GROWTH

## 2013-03-12 LAB — GC/CHLAMYDIA PROBE AMP
CT PROBE, AMP APTIMA: NEGATIVE
GC Probe RNA: NEGATIVE

## 2013-07-07 ENCOUNTER — Encounter (HOSPITAL_COMMUNITY): Payer: Self-pay | Admitting: Emergency Medicine

## 2013-07-07 ENCOUNTER — Emergency Department (HOSPITAL_COMMUNITY)
Admission: EM | Admit: 2013-07-07 | Discharge: 2013-07-07 | Disposition: A | Discharge status: Home / Self Care | Payer: No Typology Code available for payment source | Attending: Emergency Medicine | Admitting: Emergency Medicine

## 2013-07-07 DIAGNOSIS — Z7982 Long term (current) use of aspirin: Secondary | ICD-10-CM | POA: Insufficient documentation

## 2013-07-07 DIAGNOSIS — Z862 Personal history of diseases of the blood and blood-forming organs and certain disorders involving the immune mechanism: Secondary | ICD-10-CM | POA: Insufficient documentation

## 2013-07-07 DIAGNOSIS — Z8679 Personal history of other diseases of the circulatory system: Secondary | ICD-10-CM | POA: Insufficient documentation

## 2013-07-07 DIAGNOSIS — K0889 Other specified disorders of teeth and supporting structures: Secondary | ICD-10-CM

## 2013-07-07 DIAGNOSIS — J45909 Unspecified asthma, uncomplicated: Secondary | ICD-10-CM | POA: Insufficient documentation

## 2013-07-07 DIAGNOSIS — K089 Disorder of teeth and supporting structures, unspecified: Secondary | ICD-10-CM | POA: Insufficient documentation

## 2013-07-07 MED ORDER — HYDROCODONE-ACETAMINOPHEN 5-325 MG PO TABS: 1.0000 | ORAL_TABLET | Freq: Four times a day (QID) | ORAL | Status: DC | PRN

## 2013-07-07 MED ORDER — IBUPROFEN 800 MG PO TABS: 800.0000 mg | ORAL_TABLET | Freq: Three times a day (TID) | ORAL | Status: DC

## 2013-07-07 MED ORDER — HYDROCODONE-ACETAMINOPHEN 5-325 MG PO TABS
1.0000 | ORAL_TABLET | Freq: Once | ORAL | Status: AC
  Administered 2013-07-07: 1 via ORAL
  Filled 2013-07-07: qty 1

## 2013-07-07 MED ORDER — AMOXICILLIN 500 MG PO CAPS: 500.0000 mg | ORAL_CAPSULE | Freq: Three times a day (TID) | ORAL | Status: DC

## 2013-07-07 MED ORDER — KETOROLAC TROMETHAMINE 60 MG/2ML IM SOLN
60.0000 mg | Freq: Once | INTRAMUSCULAR | Status: AC
  Administered 2013-07-07: 60 mg via INTRAMUSCULAR
  Filled 2013-07-07: qty 2

## 2013-07-07 NOTE — ED Notes (Signed)
Pt states she started having right upper tooth pain and a headache on the right side since Friday. Thinks it might be her wisdom tooth.

## 2013-07-07 NOTE — ED Notes (Signed)
Med obs go at [phone]

## 2013-07-07 NOTE — ED Provider Notes (Signed)
CSN: [id number]     Arrival date & time 07/07/13  0355 History   First MD Initiated Contact with Patient 07/07/13 [id number]     No chief complaint on file.    (Consider location/radiation/quality/duration/timing/severity/associated sxs/prior Treatment) HPI Lindsay Perez is a 22 y.o. female who presents to emergency department complaining of right facial pain. Patient states she developed a toothache approximately one week ago in the right upper back. States pain with opening her mouth and chewing. Nothing makes pain better. She states gradually she now has pain to the right face. She denies any fever or chills. She states she has been taking ibuprofen with no relief. She denies any facial swelling. She denies any swelling under the tongue. She denies any difficulty swallowing. She does not have a dentist. She denies history of the same.  Past Medical History  Diagnosis Date  . Asthma   . Seasonal allergies   . Headache(784.0)     migranes  . Anemia    Past Surgical History  Procedure Laterality Date  . No past surgeries     No family history on file. History  Substance Use Topics  . Smoking status: Never Smoker   . Smokeless tobacco: Never Used  . Alcohol Use: 11.4 oz/week    14 Shots of liquor, 5 Glasses of wine per week     Comment: every day use   OB History   Grav Para Term Preterm Abortions TAB SAB Ect Mult Living   1    1  1    0     Review of Systems  Constitutional: Negative for fever and chills.  HENT: Positive for dental problem. Negative for congestion, ear pain, facial swelling, sinus pressure and trouble swallowing.        Positive for facial pain  Eyes: Negative for pain.  Skin: Negative for rash and wound.  Neurological: Positive for headaches.      Allergies  Shellfish allergy  Home Medications   Prior to Admission medications   Medication Sig Start Date End Date Taking? Authorizing Provider  acetaminophen (TYLENOL) 500 MG tablet Take 1,000-1,500  mg by mouth every 6 (six) hours as needed for mild pain.     Historical Provider, MD  albuterol (PROVENTIL HFA;VENTOLIN HFA) 108 (90 BASE) MCG/ACT inhaler Inhale 2 puffs into the lungs every 4 (four) hours as needed for wheezing.    Historical Provider, MD  aspirin 325 MG tablet Take 975 mg by mouth 2 (two) times daily as needed for headache.    Historical Provider, MD  Doxylamine Succinate, Sleep, (SLEEP AID PO) Take 2 tablets by mouth at bedtime as needed.    Historical Provider, MD  ibuprofen (ADVIL,MOTRIN) 200 MG tablet Take 600 mg by mouth every 6 (six) hours as needed for headache or mild pain.    Historical Provider, MD  naproxen sodium (ALEVE) 220 MG tablet Take 440 mg by mouth daily as needed (For headache.).    Historical Provider, MD  Soft Lens Products (CLEAR EYES CONTACT LENS RELIEF) SOLN Place 2 drops into both eyes daily as needed (For eye dryness due to contact lenses.).    Historical Provider, MD   There were no vitals taken for this visit. Physical Exam  Nursing note and vitals reviewed. Constitutional: She appears well-developed and well-nourished. No distress.  HENT:  Head: Normocephalic and atraumatic.  Right Ear: Tympanic membrane, external ear and ear canal normal.  Left Ear: Tympanic membrane, external ear and ear canal normal.  Nose: Nose normal.  Mouth/Throat:     No facial swelling, no swelling under the tongue, no trismus  Eyes: Conjunctivae are normal.  Neck: Neck supple.  Cardiovascular: Normal rate, regular rhythm and normal heart sounds.   Pulmonary/Chest: Effort normal and breath sounds normal. No respiratory distress. She has no wheezes. She has no rales.  Musculoskeletal: She exhibits no edema.  Lymphadenopathy:    She has no cervical adenopathy.  Neurological: She is alert.  Skin: Skin is warm and dry.  Psychiatric: She has a normal mood and affect. Her behavior is normal.    ED Course  Procedures (including critical care time) Labs Review Labs  Reviewed - No data to display  Imaging Review No results found.   EKG Interpretation None      MDM   Final diagnoses:  Pain, dental    Patient with dental pain and right facial pain which I believe is related to her dental issue. There is no obvious abscess or facial swelling on exam. Her teeth are tender to palpation and there is mild gum swelling. Will start on antibiotic, pain medications, followup with a dentist.    Filed Vitals:   07/07/13 0916  BP: 125/89  Pulse: 83  Temp: 97.5 F (36.4 C)  TempSrc: Oral  Resp: 20  Height: 5\' 1"  (1.549 m)  Weight: 208 lb (94.348 kg)  SpO2: 100%     Myriam Jacobsonatyana A Samhita Kretsch, PA-C 07/07/13 1614

## 2013-07-07 NOTE — Discharge Instructions (Signed)
Take amoxicillin as prescribed until all gone. Ibuprofen and norco for pain. Follow up with a dentist as referred.    Dental Pain A tooth ache may be caused by cavities (tooth decay). Cavities expose the nerve of the tooth to air and hot or cold temperatures. It may come from an infection or abscess (also called a boil or furuncle) around your tooth. It is also often caused by dental caries (tooth decay). This causes the pain you are having. DIAGNOSIS  Your caregiver can diagnose this problem by exam. TREATMENT   If caused by an infection, it may be treated with medications which kill germs (antibiotics) and pain medications as prescribed by your caregiver. Take medications as directed.  Only take over-the-counter or prescription medicines for pain, discomfort, or fever as directed by your caregiver.  Whether the tooth ache today is caused by infection or dental disease, you should see your dentist as soon as possible for further care. SEEK MEDICAL CARE IF: The exam and treatment you received today has been provided on an emergency basis only. This is not a substitute for complete medical or dental care. If your problem worsens or new problems (symptoms) appear, and you are unable to meet with your dentist, call or return to this location. SEEK IMMEDIATE MEDICAL CARE IF:   You have a fever.  You develop redness and swelling of your face, jaw, or neck.  You are unable to open your mouth.  You have severe pain uncontrolled by pain medicine. MAKE SURE YOU:   Understand these instructions.  Will watch your condition.  Will get help right away if you are not doing well or get worse. Document Released: 01/31/2005 Document Revised: 04/25/2011 Document Reviewed: 09/19/2007 [facility] Patient Information 2014 [place], [place].

## 2013-07-08 NOTE — ED Provider Notes (Signed)
Medical screening examination/treatment/procedure(s) were performed by non-physician practitioner and as supervising physician I was immediately available for consultation/collaboration.   EKG Interpretation None        David H Yao, MD 07/08/13 1504 

## 2013-10-15 ENCOUNTER — Emergency Department (HOSPITAL_COMMUNITY)
Admission: EM | Admit: 2013-10-15 | Discharge: 2013-10-15 | Disposition: A | Discharge status: Home / Self Care | Payer: No Typology Code available for payment source | Attending: Emergency Medicine | Admitting: Emergency Medicine

## 2013-10-15 ENCOUNTER — Encounter (HOSPITAL_COMMUNITY): Payer: Self-pay | Admitting: Emergency Medicine

## 2013-10-15 DIAGNOSIS — M546 Pain in thoracic spine: Secondary | ICD-10-CM | POA: Diagnosis present

## 2013-10-15 DIAGNOSIS — J45909 Unspecified asthma, uncomplicated: Secondary | ICD-10-CM | POA: Diagnosis not present

## 2013-10-15 DIAGNOSIS — Z79899 Other long term (current) drug therapy: Secondary | ICD-10-CM | POA: Insufficient documentation

## 2013-10-15 DIAGNOSIS — Z792 Long term (current) use of antibiotics: Secondary | ICD-10-CM | POA: Diagnosis not present

## 2013-10-15 DIAGNOSIS — Z862 Personal history of diseases of the blood and blood-forming organs and certain disorders involving the immune mechanism: Secondary | ICD-10-CM | POA: Insufficient documentation

## 2013-10-15 DIAGNOSIS — M539 Dorsopathy, unspecified: Secondary | ICD-10-CM | POA: Insufficient documentation

## 2013-10-15 DIAGNOSIS — E65 Localized adiposity: Secondary | ICD-10-CM

## 2013-10-15 DIAGNOSIS — M542 Cervicalgia: Secondary | ICD-10-CM | POA: Diagnosis not present

## 2013-10-15 DIAGNOSIS — Z7982 Long term (current) use of aspirin: Secondary | ICD-10-CM | POA: Diagnosis not present

## 2013-10-15 MED ORDER — NAPROXEN 500 MG PO TABS: 500.0000 mg | ORAL_TABLET | Freq: Two times a day (BID) | ORAL | Status: DC | PRN

## 2013-10-15 NOTE — Discharge Instructions (Signed)
Avoid applying pressure with any undergarments over this area. Use Naprosyn as directed for pain. Apply heat as needed to help with pain, no more than 20 minutes at a time. Followup with a regular doctor for ongoing care this issue. Return to the ER for any changing or worsening of symptoms.   Emergency Department Resource Guide 1) Find a Doctor and Pay Out of Pocket Although you won't have to find out who is covered by your insurance plan, it is a good idea to ask around and get recommendations. You will then need to call the office and see if the doctor you have chosen will accept you as a new patient and what types of options they offer for patients who are self-pay. Some doctors offer discounts or will set up payment plans for their patients who do not have insurance, but you will need to ask so you aren't surprised when you get to your appointment.  2) Contact Your Local Health Department Not all health departments have doctors that can see patients for sick visits, but many do, so it is worth a call to see if yours does. If you don't know where your local health department is, you can check in your phone book. The CDC also has a tool to help you locate your state's health department, and many state websites also have listings of all of their local health departments.  3) Find a Walk-in Clinic If your illness is not likely to be very severe or complicated, you may want to try a walk in clinic. These are popping up all over the country in pharmacies, drugstores, and shopping centers. They're usually staffed by nurse practitioners or physician assistants that have been trained to treat common illnesses and complaints. They're usually fairly quick and inexpensive. However, if you have serious medical issues or chronic medical problems, these are probably not your best option.  No Primary Care Doctor: - Call Health Connect at  [phone] - they can help you locate a primary care doctor that  accepts  your insurance, provides certain services, etc. - Physician Referral Service- [phone]  Chronic Pain Problems: Organization         Address  Phone   Notes  [facility]  [phone] Patients need to be referred by their primary care doctor.   Medication Assistance: Organization         Address  Phone   Notes  [facility] Medication [facility] [address] [place] [place], [place] [address] [phone] --Must be a resident of [facility] -- Must have NO insurance coverage whatsoever (no Medicaid/ Medicare, etc.) -- The pt. MUST have a primary care doctor that directs their care regularly and follows them in the community   [facility]  [phone]   [employer]  [phone]    Agencies that provide inexpensive medical care: Organization         Address  Phone   Notes  [facility]  [phone]   [facility]    [phone]   [facility] [address] [place], [place] [address] [phone]   [facility] [place] [address] [place] [phone]   [facility]anned [facility]    [phone]   [facility]    [phone]   [facility]  [address], [place] Phone:  [phone], Fax:  [phone] Hours of Operation:  9 am - 6 pm, M-F.  Also accepts Medicaid/Medicare and self-pay.  °[facility] ° [address], [place] Phone: [phone], Fax: [phone]. Hours of Operation:  8:30 am - 5:30 pm, M-F.  Also accepts Medicaid and self-pay.  °[facility] [place] [address], [place] Phone: ([phone]) [phone]   °[facility] [address], [place], [place] ([phone], Ext. 123 Mondays & Thursdays: 7-9 AM.  First 15 patients are seen on a first come, first serve basis. °  ° °Medicaid-accepting [place] Providers: ° °Organization          Address  Phone   Notes  °[facility] [address], [place] [phone]) [phone] Also accepts self-pay patients.  °[facility] [address], [place] ° ([phone]) [phone]   °[facility] [address], [place] ([phone]) [phone]   °[facility] [address] [place] [address], [place] ([phone]) [phone]   °Veita Bland [address], [place]  ° ([phone]) [phone] Only accepts [place] Access Medicaid patients after they have their name applied to their card.  ° °Self-Pay (no insurance) in [place]: ° °Organization         Address  Phone   Notes  °Sickle Cell Patients, [facility] [address], [place] ([phone]) [phone]   °[facility] [address], [place] (336) [phone]   °[facility] [place] ° [address], [place] ([phone]) [phone]   °[facility]/Dr. Osei-Bonsu ° [address] [place] [address], [place] or [address], [place] ([phone]) [phone] Phone number for both [place] and [place] locations is the same.  °[facility] [address], [place] ([phone]) [phone]   °[facility] [address] [place] [address], [place] or [address] ([phone]) [phone] °(336) [phone]   °[facility] [address], [place] (336) [phone], phone; ([phone]) [phone], fax Sees patients 1st and 3rd Saturday of every month.  Must not qualify for public or private insurance (i.e. Medicaid, Medicare, [place] Health Choice, Veterans' Benefits) • Household income should be no more than 200% of the poverty level •The clinic cannot treat you if you are pregnant or think you are pregnant • Sexually transmitted diseases are not treated at the clinic.  ° ° °Dental Care: °Organization         Address  Phone  Notes  °[place] [facility] [address],  [place] (336) [phone] Accepts children up to age 21 who are enrolled in Medicaid or [place] Health Choice; pregnant women with a Medicaid card; and children who have applied for Medicaid or [place] Health Choice, but were declined, whose parents can pay a reduced fee at time of service.  °[place] [facility] [place]  [address], [place] (336) [phone] Accepts children up to age 21 who are enrolled in Medicaid or [place] Health Choice; pregnant women with a Medicaid card; and children who have applied for Medicaid or  Health Choice, but were declined, whose parents can pay a reduced fee at time of service.  °[facility] ° [address], [place] ([phone]) [phone] Patients are seen by appointment only. Walk-ins are not accepted. [facility] will see patients 18 years   of age and older. °Monday - Tuesday (8am-5pm) °Most Wednesdays (8:30-5pm) °$30 per visit, cash only  °[facility] ° [address], [place] ([phone]) [phone] Patients are seen by appointment only. Walk-ins are not accepted. [facility] will see patients 18 years of age and older. °One Wednesday Evening (Monthly: Volunteer Based).  $30 per visit, cash only  °[facility]  ([phone] for adults; Children under age 4, call Graduate Pediatric Dentistry at [phone]. Children aged 4-14, please call [phone] to request a pediatric application. ° Dental services are provided in all areas of dental care including fillings, crowns and bridges, complete and partial dentures, implants, gum treatment, root canals, and extractions. Preventive care is also provided. Treatment is provided to both adults and children. °Patients are selected via a lottery and there is often a waiting list. °  °[facility] [address], °[place] ° (336) 763-8833 [email or web address] °  °[facility] [address], [place], [place]  (336)723-1848, Ext. 123 Second and Fourth Thursday of each month, opens at 6:30 AM; Clinic ends at 9 AM.  Patients are seen on a first-come first-served basis, and a limited number are seen during each clinic.  ° °[facility] ° [address], [place], [place] ([phone]) [phone]   Eligibility Requirements °You must have lived in [place], [place], or [place] counties for at least the last three months. °  You cannot be eligible for state or federal sponsored healthcare insurance, including Veterans Administration, Medicaid, or Medicare. °  You generally cannot be eligible for healthcare insurance through your employer.  °  How to apply: °Eligibility screenings are held every Tuesday and Wednesday afternoon from 1:00 pm until 4:00 pm. You do not need an appointment for the interview!  °[facility] [address] [place] [address], [place], [place] [phone]   °[facility]  [phone]   °[facility]  336-703-3100   °[facility]  336-570-6415   ° °Behavioral Health Resources in the Community: °Intensive Outpatient Programs °Organization         Address  Phone  Notes  °[facility] [address], [place], [place] [phone]   °[facility] [address], [place], [place] 336-832-9800   °[facility] [address], [place], [place] ° 336-882-2125   °[facility] [address],  °[place], [place] 1-800-853-5163 or 336-641-4981   °Substance Abuse Resources °Organization         Address  Phone  Notes  °[facility]  336-882-2125   °[facility]  336-784-9470   °[facility]  336-285-9073   °[facility]  336-845-3988   °Residential & Outpatient Substance Abuse Program  1-800-659-3381   °Psychological Services °Organization         Address  Phone  Notes  °[facility]  336- [phone]   °[facility]  336- 378-7881    °[facility] [address], [place] 1-800-853-5163 or 336-641-4981   ° °Mobile Crisis Teams °Organization         Address  Phone  Notes  °[facility], Mobile Crisis Care Unit  1-877-626-1772   °Assertive °[facility] ° [address] [place], [place] 336-834-9664   °Sharon DeEsch [address] °[place]  336-554-5454   ° °Self-Help/Support Groups °Organization           Address  Phone             Notes  [place] [place] - variety of support groups  [place] Call for more information  Narcotics Anonymous (NA), [facility] [address], [employer] [place]  2 meetings at this location   Special educational needs teacher         Address  Phone  Notes  [facility] [place],    [place]  1-(469)477-4289   [facility]  [address] [place] [address], [place], [place]   [place], [place] [phone] Admissions: 8am-3pm M-F  [facility]stance [place] [address],    [place], [place] [phone]   [facility] [address] [place], [place], [place]   [facility] [address]  [place], [place]   [facility] - Intensive Outpatient [address], [place], [place]   [facility] ([place].,  [place], [place] 1-(574)711-1611 or [phone]   [facility] ([facility] [address] [place], [place] Accepts Medicaid  [facility] [place] [address]  [place] 1-432-012-6004 Substance Abuse/Addiction Treatment   [facility] Organization         Address  Phone  Notes  [facility]  803-823-1817   Domenic Schwab, PhD [address] [place], [place]   513-071-9308 or [phone]   [place]  [place], [place] [phone]   [facility] [address] [place], [place] [phone] Insurance/Medicaid/sponsorship through [facility] [address]                                    [place], [place] [phone] [place] [address], [place] [phone]    Dr. Adele Schilder  [phone]   [facility] Dept. 1) [address] , [place] 2) [place] 3)  [address], [place] [phone]  [phone]   [place] [phone] or [phone] (After Hours)

## 2013-10-15 NOTE — ED Notes (Signed)
Patient states intermittent upper back pain.   Patient states has been hurting for awhile.    Denies other symptoms.

## 2013-10-15 NOTE — ED Provider Notes (Signed)
CSN: [id number]     Arrival date & time 10/15/13  1119 History  This chart was scribed for non-physician practitioner, Allen Derry, PA-C working with Mirian Mo, MD by Greggory Stallion, ED scribe. This patient was seen in room TR11C/TR11C and the patient's care was started at 1:04 PM.   Chief Complaint  Patient presents with  . Back Pain   Patient is a 23 y.o. female presenting with back pain. The history is provided by the patient. No language interpreter was used.  Back Pain Location:  Thoracic spine Quality:  Aching (sharp, throbbing) Radiates to:  Does not radiate Pain severity:  Moderate Onset quality:  Gradual Duration:  2 months Timing:  Constant Progression:  Worsening Chronicity:  New Relieved by:  Narcotics Exacerbated by: shoulder movement. Ineffective treatments:  NSAIDs Associated symptoms: no chest pain, no fever and no numbness    HPI Comments: Lindsay Perez is a 23 y.o. female with a PMHx of headaches and allergies, who presents to the Emergency Department complaining of constant upper back pain that started about 2 months ago. States she has a large bump to the base of her neck that intermittently gets red. Denies fall or injury. Rates pain 9/10 and describes it as aching, throbbing and sharp. When pain gets very severe, it radiates into her neck and causes a headache. Pt has taken left over narcotic medications with some relief of pain. She has also taken ibuprofen with no relief. Pt states she has used a home remedy of butter and salt with some relief. Movement of the shoulders and pressure over the area worsen the pain. Denies recent prednisone use. Denies fever, chills, weight change, chest pain, SOB, red streaking or striae, rashes, numbness or tingling in hands or arms, sleep disturbance, heat/cold intolerance, easy bruising.   Past Medical History  Diagnosis Date  . Asthma   . Seasonal allergies   . Headache(784.0)     migranes  . Anemia     Past Surgical History  Procedure Laterality Date  . No past surgeries     No family history on file. History  Substance Use Topics  . Smoking status: Never Smoker   . Smokeless tobacco: Never Used  . Alcohol Use: 11.4 oz/week    5 Glasses of wine, 14 Shots of liquor per week     Comment: occasionally   OB History   Grav Para Term Preterm Abortions TAB SAB Ect Mult Living   0     Review of Systems  Constitutional: Negative for fever, chills and unexpected weight change.  Respiratory: Negative for shortness of breath.   Cardiovascular: Negative for chest pain.  Gastrointestinal: Negative for abdominal distention.  Endocrine: Negative for cold intolerance and heat intolerance.  Genitourinary: Negative for menstrual problem.  Musculoskeletal: Positive for back pain.  Skin: Negative for color change and rash.  Allergic/Immunologic: Negative for immunocompromised state.  Neurological: Negative for numbness.  Hematological: Negative for adenopathy. Does not bruise/bleed easily.  Psychiatric/Behavioral: Negative for sleep disturbance.  10 Systems reviewed and are negative for acute change except as noted in the HPI.  Allergies  Shellfish allergy  Home Medications   Prior to Admission medications   Medication Sig Start Date End Date Taking? Authorizing Provider  acetaminophen (TYLENOL) 500 MG tablet Take 1,000-1,500 mg by mouth every 6 (six) hours as needed for mild pain.     Historical Provider, MD  albuterol (PROVENTIL HFA;VENTOLIN HFA) 108 (90 BASE) MCG/ACT  inhaler Inhale 2 puffs into the lungs every 4 (four) hours as needed for wheezing.    Historical Provider, MD  amoxicillin (AMOXIL) 500 MG capsule Take 1 capsule (500 mg total) by mouth 3 (three) times daily. 07/07/13   Tatyana A Kirichenko, PA-C  aspirin 325 MG tablet Take 975 mg by mouth 2 (two) times daily as needed for headache.    Historical Provider, MD  Aspirin-Acetaminophen-Caffeine (EXCEDRIN PO) Take 2  tablets by mouth 2 (two) times daily as needed (for headache).    Historical Provider, MD  Doxylamine Succinate, Sleep, (SLEEP AID PO) Take 2 tablets by mouth at bedtime as needed.    Historical Provider, MD  HYDROcodone-acetaminophen (NORCO) 5-325 MG per tablet Take 1 tablet by mouth every 6 (six) hours as needed for moderate pain. 07/07/13   Tatyana A Kirichenko, PA-C  ibuprofen (ADVIL,MOTRIN) 200 MG tablet Take 600 mg by mouth every 6 (six) hours as needed for headache or mild pain.    Historical Provider, MD  ibuprofen (ADVIL,MOTRIN) 800 MG tablet Take 1 tablet (800 mg total) by mouth 3 (three) times daily. 07/07/13   Tatyana A Kirichenko, PA-C  naproxen sodium (ALEVE) 220 MG tablet Take 440 mg by mouth daily as needed (For headache.).    Historical Provider, MD   BP 134/82  Pulse 92  Temp(Src) 98.8 F (37.1 C) (Oral)  Resp 18  Ht  (1.549 m)  Wt 198 lb (89.812 kg)  BMI 37.43 kg/m2  SpO2 98%  LMP 09/17/2013  Physical Exam  Nursing note and vitals reviewed. Constitutional: She is oriented to person, place, and time. Vital signs are normal. She appears well-developed and well-nourished. No distress.  HENT:  Head: Normocephalic and atraumatic.  Mouth/Throat: Mucous membranes are normal.  Eyes: Conjunctivae and EOM are normal.  Neck: Normal range of motion. Neck supple. No spinous process tenderness and no muscular tenderness present. No rigidity. No edema, no erythema and normal range of motion present.    Full range of motion intact without spinous process or paracervical muscle tenderness to palpation, no spasm noted. No rigidity. No edema or erythema to this area. Dorsal fat pad located midline, with mild tenderness to palpation, but no skin changes. No cellulitis or abscess this area.  Cardiovascular: Normal rate and intact distal pulses.   Pulmonary/Chest: Effort normal. No respiratory distress.  Abdominal: Normal appearance. She exhibits no distension.  obese   Musculoskeletal: Normal range of motion.  MAE x4. Strength and sensation grossly intact in all extremities. Distal pulses intact.  Neurological: She is alert and oriented to person, place, and time. She has normal strength. No sensory deficit. Gait normal.  Skin: Skin is warm, dry and intact. No rash noted. No erythema.  No skin changes noted over exposed surfaces.  Psychiatric: She has a normal mood and affect. Her behavior is normal.    ED Course  Procedures (including critical care time)  DIAGNOSTIC STUDIES: Oxygen Saturation is 98% on RA, normal by my interpretation.    COORDINATION OF CARE: 1:11 PM-Discussed treatment plan which includes naproxen with pt at bedside and pt agreed to plan. Will give pt PCP referrals and advised her to follow up.   Labs Review Labs Reviewed - No data to display  Imaging Review No results found.   EKG Interpretation None      MDM   Final diagnoses:  Dorsal cervical fat pad  Neck pain    22y/o female with chronic dorsal fat pad pain, with no inciting injury  or event that led to the fat pad. Patient is obese, does not appear cushingoid, and is vague regarding when the fat pad began. No acute change or concern for emergent medical condition needing further imaging or workup. Discussed naproxen use for pain, as well as heat pads. Discussed avoiding any bras or halter tops that would irritate this area. Resource guide given for PCP f/up. I explained the diagnosis and have given explicit precautions to return to the ER including for any other new or worsening symptoms. The patient understands and accepts the medical plan as it's been dictated and I have answered their questions. Discharge instructions concerning home care and prescriptions have been given. The patient is STABLE and is discharged to home in good condition.   I personally performed the services described in this documentation, which was scribed in my presence. The recorded  information has been reviewed and is accurate.  BP 122/83  Pulse 80  Temp(Src) 98.8 F (37.1 C) (Oral)  Resp 18  Ht  (1.549 m)  Wt 198 lb (89.812 kg)  BMI 37.43 kg/m2  SpO2 99%  LMP 09/17/2013  Meds ordered this encounter  Medications  . naproxen (NAPROSYN) 500 MG tablet    Sig: Take 1 tablet (500 mg total) by mouth 2 (two) times daily as needed for mild pain, moderate pain or headache (TAKE WITH MEALS.).    Dispense:  20 tablet    Refill:  0    Order Specific Question:  Supervising Provider    Answer:  Vida Roller [address] [place], PA-C 10/15/13 1349

## 2013-10-16 NOTE — ED Provider Notes (Signed)
Medical screening examination/treatment/procedure(s) were performed by non-physician practitioner and as supervising physician I was immediately available for consultation/collaboration.   EKG Interpretation None        Annasofia Pohl, MD 10/16/13 0758 

## 2013-10-28 ENCOUNTER — Encounter: Payer: Self-pay | Admitting: Family Medicine

## 2013-10-28 ENCOUNTER — Ambulatory Visit: Payer: No Typology Code available for payment source | Attending: Family Medicine | Admitting: Family Medicine

## 2013-10-28 VITALS — BP 113/74 | HR 71 | Temp 98.0°F | Resp 16 | Ht 62.0 in | Wt 211.0 lb

## 2013-10-28 DIAGNOSIS — M549 Dorsalgia, unspecified: Secondary | ICD-10-CM | POA: Insufficient documentation

## 2013-10-28 DIAGNOSIS — J45909 Unspecified asthma, uncomplicated: Secondary | ICD-10-CM | POA: Insufficient documentation

## 2013-10-28 DIAGNOSIS — E669 Obesity, unspecified: Secondary | ICD-10-CM | POA: Insufficient documentation

## 2013-10-28 DIAGNOSIS — Z6838 Body mass index (BMI) 38.0-38.9, adult: Secondary | ICD-10-CM | POA: Diagnosis not present

## 2013-10-28 LAB — POCT GLYCOSYLATED HEMOGLOBIN (HGB A1C): Hemoglobin A1C: 5.5

## 2013-10-28 MED ORDER — GABAPENTIN 100 MG PO CAPS: 100.0000 mg | ORAL_CAPSULE | Freq: Every evening | ORAL | Status: DC | PRN

## 2013-10-28 NOTE — Progress Notes (Signed)
Establish Care Complaining of pain in neck, due to a lump Stated pain run to the midd back

## 2013-10-28 NOTE — Patient Instructions (Addendum)
Lindsay Perez,   Thank you for coming in today. It was a pleasure meeting you. I look forward to be a primary doctor.  For upper back, your exam is revealing for weakness and inflammation. No signs of cancer, mass or neck arthritis.   1. Upper back exercise and stretches - google exercises for upper back strengthening.  2. Posture: keep chin tucked close, head up and shoulders back.  3. Sleep: on back with hands at side.   If needed take gabapentin 100 mg nightly.  F/u for physical with pap at your earliest convenience  Dr. Armen Pickup    Contraception Choices Contraception (birth control) is the use of any methods or devices to prevent pregnancy. Below are some methods to help avoid pregnancy. HORMONAL METHODS   Contraceptive implant. This is a thin, plastic tube containing progesterone hormone. It does not contain estrogen hormone. Your health care provider inserts the tube in the inner part of the upper arm. The tube can remain in place for up to 3 years. After 3 years, the implant must be removed. The implant prevents the ovaries from releasing an egg (ovulation), thickens the cervical mucus to prevent sperm from entering the uterus, and thins the lining of the inside of the uterus.  Progesterone-only injections. These injections are given every 3 months by your health care provider to prevent pregnancy. This synthetic progesterone hormone stops the ovaries from releasing eggs. It also thickens cervical mucus and changes the uterine lining. This makes it harder for sperm to survive in the uterus.  Birth control pills. These pills contain estrogen and progesterone hormone. They work by preventing the ovaries from releasing eggs (ovulation). They also cause the cervical mucus to thicken, preventing the sperm from entering the uterus. Birth control pills are prescribed by a health care provider.Birth control pills can also be used to treat heavy periods.  Minipill. This type of birth  control pill contains only the progesterone hormone. They are taken every day of each month and must be prescribed by your health care provider.  Birth control patch. The patch contains hormones similar to those in birth control pills. It must be changed once a week and is prescribed by a health care provider.  Vaginal ring. The ring contains hormones similar to those in birth control pills. It is left in the vagina for 3 weeks, removed for 1 week, and then a new one is put back in place. The patient must be comfortable inserting and removing the ring from the vagina.A health care provider's prescription is necessary.  Emergency contraception. Emergency contraceptives prevent pregnancy after unprotected sexual intercourse. This pill can be taken right after sex or up to 5 days after unprotected sex. It is most effective the sooner you take the pills after having sexual intercourse. Most emergency contraceptive pills are available without a prescription. Check with your pharmacist. Do not use emergency contraception as your only form of birth control. BARRIER METHODS   Female condom. This is a thin sheath (latex or rubber) that is worn over the penis during sexual intercourse. It can be used with spermicide to increase effectiveness.  Female condom. This is a soft, loose-fitting sheath that is put into the vagina before sexual intercourse.  Diaphragm. This is a soft, latex, dome-shaped barrier that must be fitted by a health care provider. It is inserted into the vagina, along with a spermicidal jelly. It is inserted before intercourse. The diaphragm should be left in the vagina for 6 to 8 hours  after intercourse.  Cervical cap. This is a round, soft, latex or plastic cup that fits over the cervix and must be fitted by a health care provider. The cap can be left in place for up to 48 hours after intercourse.  Sponge. This is a soft, circular piece of polyurethane foam. The sponge has spermicide in it.  It is inserted into the vagina after wetting it and before sexual intercourse.  Spermicides. These are chemicals that kill or block sperm from entering the cervix and uterus. They come in the form of creams, jellies, suppositories, foam, or tablets. They do not require a prescription. They are inserted into the vagina with an applicator before having sexual intercourse. The process must be repeated every time you have sexual intercourse. INTRAUTERINE CONTRACEPTION  Intrauterine device (IUD). This is a T-shaped device that is put in a woman's uterus during a menstrual period to prevent pregnancy. There are 2 types:  Copper IUD. This type of IUD is wrapped in copper wire and is placed inside the uterus. Copper makes the uterus and fallopian tubes produce a fluid that kills sperm. It can stay in place for 10 years.  Hormone IUD. This type of IUD contains the hormone progestin (synthetic progesterone). The hormone thickens the cervical mucus and prevents sperm from entering the uterus, and it also thins the uterine lining to prevent implantation of a fertilized egg. The hormone can weaken or kill the sperm that get into the uterus. It can stay in place for 3-5 years, depending on which type of IUD is used. PERMANENT METHODS OF CONTRACEPTION  Female tubal ligation. This is when the woman's fallopian tubes are surgically sealed, tied, or blocked to prevent the egg from traveling to the uterus.  Hysteroscopic sterilization. This involves placing a small coil or insert into each fallopian tube. Your doctor uses a technique called hysteroscopy to do the procedure. The device causes scar tissue to form. This results in permanent blockage of the fallopian tubes, so the sperm cannot fertilize the egg. It takes about 3 months after the procedure for the tubes to become blocked. You must use another form of birth control for these 3 months.  Female sterilization. This is when the female has the tubes that carry sperm  tied off (vasectomy).This blocks sperm from entering the vagina during sexual intercourse. After the procedure, the man can still ejaculate fluid (semen). NATURAL PLANNING METHODS  Natural family planning. This is not having sexual intercourse or using a barrier method (condom, diaphragm, cervical cap) on days the woman could become pregnant.  Calendar method. This is keeping track of the length of each menstrual cycle and identifying when you are fertile.  Ovulation method. This is avoiding sexual intercourse during ovulation.  Symptothermal method. This is avoiding sexual intercourse during ovulation, using a thermometer and ovulation symptoms.  Post-ovulation method. This is timing sexual intercourse after you have ovulated. Regardless of which type or method of contraception you choose, it is important that you use condoms to protect against the transmission of sexually transmitted infections (STIs). Talk with your health care provider about which form of contraception is most appropriate for you. Document Released: 01/31/2005 Document Revised: 02/05/2013 Document Reviewed: 07/26/2012 [facility] Patient Information 2015 [place], [place]. This information is not intended to replace advice given to you by your health care provider. Make sure you discuss any questions you have with your health care provider.

## 2013-10-28 NOTE — Assessment & Plan Note (Addendum)
A: pain in upper back with fullness. Suspect muscle weakness. No red flags. P: 1. Upper back exercise and stretches - google exercises for upper back strengthening.  2. Posture: keep chin tucked close, head up and shoulders back.  3. Sleep: on back with hands at side.

## 2013-10-28 NOTE — Progress Notes (Signed)
   Subjective:    Patient ID: Lindsay Perez, female    DOB: 1991-01-24, 23 y.o.   MRN: [id number] CC: establish care, painful lump on back  HPI 23 year old female presents to the status care discussed the following:  #1 midline upper back pain: Patient with 2 months of midline upper back pain associated with muscle fullness. She also has upper back tenderness. Pain does not radiate down her arms. Pain exacerbated by heavy lifting. Patient does note weight gain over the past few years. Patient also notes increase in her breast size and the past 2 years. Patient denies accidents/trauma. No fever, chills, weight loss.   Social history: Negative for tobacco abuse. Positive for marijuana use. Review of Systems As per HPI     Objective:   Physical Exam BP 113/74  Pulse 71  Temp(Src) 98 F (36.7 C) (Oral)  Resp 16  Ht  (1.575 m)  Wt 211 lb (95.709 kg)  BMI 38.58 kg/m2  SpO2 98%  LMP 09/17/2013 General appearance: alert, cooperative, no distress and mildly obese Back: symmetric, no curvature. ROM normal. No CVA tenderness., upper back soft tissue fullness, tender, no fluctuance, no discreet mass  Lungs: clear to auscultation bilaterally Heart: regular rate and rhythm, S1, S2 normal, no murmur, click, rub or gallop  Lab Results  Component Value Date   HGBA1C 5.5 10/28/2013      Assessment & Plan:

## 2013-12-16 ENCOUNTER — Encounter: Payer: Self-pay | Admitting: Family Medicine

## 2014-02-28 ENCOUNTER — Encounter (HOSPITAL_COMMUNITY): Payer: Self-pay | Admitting: *Deleted

## 2014-02-28 ENCOUNTER — Inpatient Hospital Stay (HOSPITAL_COMMUNITY)
Admission: AD | Admit: 2014-02-28 | Discharge: 2014-02-28 | Disposition: A | Discharge status: Home / Self Care | Payer: No Typology Code available for payment source | Source: Ambulatory Visit | Attending: Family Medicine | Admitting: Family Medicine

## 2014-02-28 DIAGNOSIS — M940 Chondrocostal junction syndrome [Tietze]: Secondary | ICD-10-CM | POA: Insufficient documentation

## 2014-02-28 DIAGNOSIS — A599 Trichomoniasis, unspecified: Secondary | ICD-10-CM | POA: Diagnosis not present

## 2014-02-28 DIAGNOSIS — G43009 Migraine without aura, not intractable, without status migrainosus: Secondary | ICD-10-CM

## 2014-02-28 DIAGNOSIS — N898 Other specified noninflammatory disorders of vagina: Secondary | ICD-10-CM | POA: Diagnosis present

## 2014-02-28 DIAGNOSIS — A5901 Trichomonal vulvovaginitis: Secondary | ICD-10-CM

## 2014-02-28 DIAGNOSIS — R0781 Pleurodynia: Secondary | ICD-10-CM | POA: Diagnosis present

## 2014-02-28 DIAGNOSIS — G43909 Migraine, unspecified, not intractable, without status migrainosus: Secondary | ICD-10-CM | POA: Diagnosis not present

## 2014-02-28 DIAGNOSIS — Z30011 Encounter for initial prescription of contraceptive pills: Secondary | ICD-10-CM

## 2014-02-28 LAB — URINALYSIS, ROUTINE W REFLEX MICROSCOPIC
Bilirubin Urine: NEGATIVE
Glucose, UA: NEGATIVE mg/dL
KETONES UR: NEGATIVE mg/dL
Nitrite: NEGATIVE
PROTEIN: NEGATIVE mg/dL
Specific Gravity, Urine: 1.01 (ref 1.005–1.030)
UROBILINOGEN UA: 0.2 mg/dL (ref 0.0–1.0)
pH: 6 (ref 5.0–8.0)

## 2014-02-28 LAB — WET PREP, GENITAL
CLUE CELLS WET PREP: NONE SEEN
Yeast Wet Prep HPF POC: NONE SEEN

## 2014-02-28 LAB — GC/CHLAMYDIA PROBE AMP (~~LOC~~) NOT AT ARMC
CHLAMYDIA, DNA PROBE: POSITIVE — AB
Neisseria Gonorrhea: NEGATIVE

## 2014-02-28 LAB — URINE MICROSCOPIC-ADD ON

## 2014-02-28 LAB — POCT PREGNANCY, URINE: PREG TEST UR: NEGATIVE

## 2014-02-28 MED ORDER — NORGESTIMATE-ETH ESTRADIOL 0.25-35 MG-MCG PO TABS: 1.0000 | ORAL_TABLET | Freq: Every day | ORAL | Status: DC

## 2014-02-28 MED ORDER — METOCLOPRAMIDE HCL 5 MG/ML IJ SOLN
10.0000 mg | Freq: Once | INTRAMUSCULAR | Status: AC
  Administered 2014-02-28: 10 mg via INTRAVENOUS
  Filled 2014-02-28: qty 2

## 2014-02-28 MED ORDER — SODIUM CHLORIDE 0.9 % IV BOLUS (SEPSIS)
500.0000 mL | Freq: Once | INTRAVENOUS | Status: AC
  Administered 2014-02-28: 500 mL via INTRAVENOUS

## 2014-02-28 MED ORDER — DIPHENHYDRAMINE HCL 50 MG/ML IJ SOLN
25.0000 mg | Freq: Once | INTRAMUSCULAR | Status: AC
Start: 2014-02-28 — End: 2014-02-28
  Administered 2014-02-28: 25 mg via INTRAVENOUS
  Filled 2014-02-28: qty 1

## 2014-02-28 MED ORDER — DEXAMETHASONE SODIUM PHOSPHATE 10 MG/ML IJ SOLN
10.0000 mg | Freq: Once | INTRAMUSCULAR | Status: AC
  Administered 2014-02-28: 10 mg via INTRAVENOUS
  Filled 2014-02-28: qty 1

## 2014-02-28 MED ORDER — IBUPROFEN 600 MG PO TABS: 600.0000 mg | ORAL_TABLET | Freq: Three times a day (TID) | ORAL | Status: DC

## 2014-02-28 MED ORDER — METRONIDAZOLE 500 MG PO TABS
2000.0000 mg | ORAL_TABLET | Freq: Once | ORAL | Status: AC
  Administered 2014-02-28: 2000 mg via ORAL
  Filled 2014-02-28: qty 4

## 2014-02-28 NOTE — MAU Note (Signed)
C/o migraine headache for 2 days; c/o foul vaginal odor; c/o R flank pain with coughing,sneezing,etc and movement for past week;
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
## 2014-02-28 NOTE — Discharge Instructions (Signed)
TAKE IBUPROFEN 600 mg 3x day for side pain Call [facility] to schedule follow up visit within next 3 months Have your partner [facility] for treatment for trichomoniasis   Migraine Headache A migraine headache is an intense, throbbing pain on one or both sides of your head. A migraine can last for 30 minutes to several hours. CAUSES  The exact cause of a migraine headache is not always known. However, a migraine may be caused when nerves in the brain become irritated and release chemicals that cause inflammation. This causes pain. Certain things may also trigger migraines, such as:  Alcohol.  Smoking.  Stress.  Menstruation.  Aged cheeses.  Foods or drinks that contain nitrates, glutamate, aspartame, or tyramine.  Lack of sleep.  Chocolate.  Caffeine.  Hunger.  Physical exertion.  Fatigue.  Medicines used to treat chest pain (nitroglycerine), birth control pills, estrogen, and some blood pressure medicines. SIGNS AND SYMPTOMS  Pain on one or both sides of your head.  Pulsating or throbbing pain.  Severe pain that prevents daily activities.  Pain that is aggravated by any physical activity.  Nausea, vomiting, or both.  Dizziness.  Pain with exposure to bright lights, loud noises, or activity.  General sensitivity to bright lights, loud noises, or smells. Before you get a migraine, you may get warning signs that a migraine is coming (aura). An aura may include:  Seeing flashing lights.  Seeing bright spots, halos, or zigzag lines.  Having tunnel vision or blurred vision.  Having feelings of numbness or tingling.  Having trouble talking.  Having muscle weakness. DIAGNOSIS  A migraine headache is often diagnosed based on:  Symptoms.  Physical exam.  A CT scan or MRI of your head. These imaging tests cannot diagnose migraines, but they can help rule out other causes of headaches. TREATMENT Medicines may be given for  pain and nausea. Medicines can also be given to help prevent recurrent migraines.  HOME CARE INSTRUCTIONS  Only take over-the-counter or prescription medicines for pain or discomfort as directed by your health care provider. The use of long-term narcotics is not recommended.  Lie down in a dark, quiet room when you have a migraine.  Keep a journal to find out what may trigger your migraine headaches. For example, write down:  What you eat and drink.  How much sleep you get.  Any change to your diet or medicines.  Limit alcohol consumption.  Quit smoking if you smoke.  Get 7-9 hours of sleep, or as recommended by your health care provider.  Limit stress.  Keep lights dim if bright lights bother you and make your migraines worse. SEEK IMMEDIATE MEDICAL CARE IF:   Your migraine becomes severe.  You have a fever.  You have a stiff neck.  You have vision loss.  You have muscular weakness or loss of muscle control.  You start losing your balance or have trouble walking.  You feel faint or pass out.  You have severe symptoms that are different from your first symptoms. MAKE SURE YOU:   Understand these instructions.  Will watch your condition.  Will get help right away if you are not doing well or get worse. Document Released: 01/31/2005 Document Revised: 06/17/2013 Document Reviewed: 10/08/2012 [facility] Patient Information 2015 [place], [place]. This information is not intended to replace advice given to you by your health care provider. Make sure you discuss any questions you have with your health care provider. Oral Contraception Use Oral  contraceptive pills (OCPs) are medicines taken to prevent pregnancy. OCPs work by preventing the ovaries from releasing eggs. The hormones in OCPs also cause the cervical mucus to thicken, preventing the sperm from entering the uterus. The hormones also cause the uterine lining to become thin, not allowing a fertilized egg to attach to  the inside of the uterus. OCPs are highly effective when taken exactly as prescribed. However, OCPs do not prevent sexually transmitted diseases (STDs). Safe sex practices, such as using condoms along with an OCP, can help prevent STDs. Before taking OCPs, you may have a physical exam and Pap test. Your health care provider may also order blood tests if necessary. Your health care provider will make sure you are a good candidate for oral contraception. Discuss with your health care provider the possible side effects of the OCP you may be prescribed. When starting an OCP, it can take 2 to 3 months for the body to adjust to the changes in hormone levels in your body.  HOW TO TAKE ORAL CONTRACEPTIVE PILLS Your health care provider may advise you on how to start taking the first cycle of OCPs. Otherwise, you can:   Start on day 1 of your menstrual period. You will not need any backup contraceptive protection with this start time.   Start on the first Sunday after your menstrual period or the day you get your prescription. In these cases, you will need to use backup contraceptive protection for the first week.   Start the pill at any time of your cycle. If you take the pill within 5 days of the start of your period, you are protected against pregnancy right away. In this case, you will not need a backup form of birth control. If you start at any other time of your menstrual cycle, you will need to use another form of birth control for 7 days. If your OCP is the type called a minipill, it will protect you from pregnancy after taking it for 2 days (48 hours). After you have started taking OCPs:   If you forget to take 1 pill, take it as soon as you remember. Take the next pill at the regular time.   If you miss 2 or more pills, call your health care provider because different pills have different instructions for missed doses. Use backup birth control until your next menstrual period starts.   If you  use a 28-day pack that contains inactive pills and you miss 1 of the last 7 pills (pills with no hormones), it will not matter. Throw away the rest of the non-hormone pills and start a new pill pack.  No matter which day you start the OCP, you will always start a new pack on that same day of the week. Have an extra pack of OCPs and a backup contraceptive method available in case you miss some pills or lose your OCP pack.  HOME CARE INSTRUCTIONS   Do not smoke.   Always use a condom to protect against STDs. OCPs do not protect against STDs.   Use a calendar to mark your menstrual period days.   Read the information and directions that came with your OCP. Talk to your health care provider if you have questions.  SEEK MEDICAL CARE IF:   You develop nausea and vomiting.   You have abnormal vaginal discharge or bleeding.   You develop a rash.   You miss your menstrual period.   You are losing your hair.  You need treatment for mood swings or depression.   You get dizzy when taking the OCP.   You develop acne from taking the OCP.   You become pregnant.  SEEK IMMEDIATE MEDICAL CARE IF:   You develop chest pain.   You develop shortness of breath.   You have an uncontrolled or severe headache.   You develop numbness or slurred speech.   You develop visual problems.   You develop pain, redness, and swelling in the legs.  Document Released: 01/20/2011 Document Revised: 06/17/2013 Document Reviewed: 07/22/2012 [facility] Patient Information 2015 [place], [place]. This information is not intended to replace advice given to you by your health care provider. Make sure you discuss any questions you have with your health care provider. Costochondritis Costochondritis, sometimes called Tietze syndrome, is a swelling and irritation (inflammation) of the tissue (cartilage) that connects your ribs with your breastbone (sternum). It causes pain in the chest and rib area.  Costochondritis usually goes away on its own over time. It can take up to 6 weeks or longer to get better, especially if you are unable to limit your activities. CAUSES  Some cases of costochondritis have no known cause. Possible causes include:  Injury (trauma).  Exercise or activity such as lifting.  Severe coughing. SIGNS AND SYMPTOMS  Pain and tenderness in the chest and rib area.  Pain that gets worse when coughing or taking deep breaths.  Pain that gets worse with specific movements. DIAGNOSIS  Your health care provider will do a physical exam and ask about your symptoms. Chest X-rays or other tests may be done to rule out other problems. TREATMENT  Costochondritis usually goes away on its own over time. Your health care provider may prescribe medicine to help relieve pain. HOME CARE INSTRUCTIONS   Avoid exhausting physical activity. Try not to strain your ribs during normal activity. This would include any activities using chest, abdominal, and side muscles, especially if heavy weights are used.  Apply ice to the affected area for the first 2 days after the pain begins.  Put ice in a plastic bag.  Place a towel between your skin and the bag.  Leave the ice on for 20 minutes, 2-3 times a day.  Only take over-the-counter or prescription medicines as directed by your health care provider. SEEK MEDICAL CARE IF:  You have redness or swelling at the rib joints. These are signs of infection.  Your pain does not go away despite rest or medicine. SEEK IMMEDIATE MEDICAL CARE IF:   Your pain increases or you are very uncomfortable.  You have shortness of breath or difficulty breathing.  You cough up blood.  You have worse chest pains, sweating, or vomiting.  You have a fever or persistent symptoms for more than 2-3 days.  You have a fever and your symptoms suddenly get worse. MAKE SURE YOU:   Understand these instructions.  Will watch your condition.  Will get  help right away if you are not doing well or get worse. Document Released: 11/10/2004 Document Revised: 11/21/2012 Document Reviewed: 09/04/2012 [facility] Patient Information 2015 [place], [place]. This information is not intended to replace advice given to you by your health care provider. Make sure you discuss any questions you have with your health care provider. Trichomoniasis Trichomoniasis is an infection caused by an organism called Trichomonas. The infection can affect both women and men. In women, the outer female genitalia and the vagina are affected. In men, the penis is mainly affected, but the  prostate and other reproductive organs can also be involved. Trichomoniasis is a sexually transmitted infection (STI) and is most often passed to another person through sexual contact.  RISK FACTORS  Having unprotected sexual intercourse.  Having sexual intercourse with an infected partner. SIGNS AND SYMPTOMS  Symptoms of trichomoniasis in women include:  Abnormal gray-green frothy vaginal discharge.  Itching and irritation of the vagina.  Itching and irritation of the area outside the vagina. Symptoms of trichomoniasis in men include:   Penile discharge with or without pain.  Pain during urination. This results from inflammation of the urethra. DIAGNOSIS  Trichomoniasis may be found during a Pap test or physical exam. Your health care provider may use one of the following methods to help diagnose this infection:  Examining vaginal discharge under a microscope. For men, urethral discharge would be examined.  Testing the pH of the vagina with a test tape.  Using a vaginal swab test that checks for the Trichomonas organism. A test is available that provides results within a few minutes.  Doing a culture test for the organism. This is not usually needed. TREATMENT   You may be given medicine to fight the infection. Women should inform their health care provider if they could be or are  pregnant. Some medicines used to treat the infection should not be taken during pregnancy.  Your health care provider may recommend over-the-counter medicines or creams to decrease itching or irritation.  Your sexual partner will need to be treated if infected. HOME CARE INSTRUCTIONS   Take medicines only as directed by your health care provider.  Take over-the-counter medicine for itching or irritation as directed by your health care provider.  Do not have sexual intercourse while you have the infection.  Women should not douche or wear tampons while they have the infection.  Discuss your infection with your partner. Your partner may have gotten the infection from you, or you may have gotten it from your partner.  Have your sex partner get examined and treated if necessary.  Practice safe, informed, and protected sex.  See your health care provider for other STI testing. SEEK MEDICAL CARE IF:   You still have symptoms after you finish your medicine.  You develop abdominal pain.  You have pain when you urinate.  You have bleeding after sexual intercourse.  You develop a rash.  Your medicine makes you sick or makes you throw up (vomit). MAKE SURE YOU:  Understand these instructions.  Will watch your condition.  Will get help right away if you are not doing well or get worse. Document Released: 07/27/2000 Document Revised: 06/17/2013 Document Reviewed: 11/12/2012 [facility] Patient Information 2015 [place], [place]. This information is not intended to replace advice given to you by your health care provider. Make sure you discuss any questions you have with your health care provider.

## 2014-02-28 NOTE — MAU Note (Signed)
Pain in rt side started a wk ago- sharp when she takes a breath.  Also has a bad odor, down there with a d/c, started 3 days ago.

## 2014-03-01 LAB — HIV ANTIBODY (ROUTINE TESTING W REFLEX)
HIV 1/O/2 Abs-Index Value: <1 (ref <1.00)
HIV-1/HIV-2 Ab: NONREACTIVE

## 2014-03-01 LAB — RPR: RPR: NONREACTIVE

## 2014-03-03 ENCOUNTER — Telehealth: Payer: Self-pay

## 2014-03-03 DIAGNOSIS — A749 Chlamydial infection, unspecified: Secondary | ICD-10-CM

## 2014-03-03 MED ORDER — AZITHROMYCIN 500 MG PO TABS: 1000.0000 mg | ORAL_TABLET | Freq: Every day | ORAL | Status: DC

## 2014-03-03 NOTE — Telephone Encounter (Signed)
Zithromax 1gm e-prescribed to patient's pharmacy per protocol.

## 2014-03-03 NOTE — Telephone Encounter (Signed)
-----   Message from Pennie BanterMarni W Smith sent at 03/03/2014 10:12 AM EST ----- Telephone call to patient regarding positive chlamydia culture, patient notified.  Patient has not been treated and [facility] [address] Instructed patient to notify her partner for treatment.  Report faxed to health department.

## 2014-03-04 ENCOUNTER — Emergency Department (HOSPITAL_COMMUNITY): Payer: No Typology Code available for payment source

## 2014-03-04 ENCOUNTER — Encounter (HOSPITAL_COMMUNITY): Payer: Self-pay | Admitting: *Deleted

## 2014-03-04 ENCOUNTER — Emergency Department (HOSPITAL_COMMUNITY)
Admission: EM | Admit: 2014-03-04 | Discharge: 2014-03-04 | Disposition: A | Discharge status: Home / Self Care | Payer: No Typology Code available for payment source | Attending: Emergency Medicine | Admitting: Emergency Medicine

## 2014-03-04 DIAGNOSIS — Z3202 Encounter for pregnancy test, result negative: Secondary | ICD-10-CM | POA: Insufficient documentation

## 2014-03-04 DIAGNOSIS — Z793 Long term (current) use of hormonal contraceptives: Secondary | ICD-10-CM | POA: Insufficient documentation

## 2014-03-04 DIAGNOSIS — R197 Diarrhea, unspecified: Secondary | ICD-10-CM | POA: Insufficient documentation

## 2014-03-04 DIAGNOSIS — Z862 Personal history of diseases of the blood and blood-forming organs and certain disorders involving the immune mechanism: Secondary | ICD-10-CM | POA: Insufficient documentation

## 2014-03-04 DIAGNOSIS — R1011 Right upper quadrant pain: Secondary | ICD-10-CM | POA: Diagnosis present

## 2014-03-04 DIAGNOSIS — Z79899 Other long term (current) drug therapy: Secondary | ICD-10-CM | POA: Insufficient documentation

## 2014-03-04 DIAGNOSIS — R079 Chest pain, unspecified: Secondary | ICD-10-CM

## 2014-03-04 DIAGNOSIS — R0789 Other chest pain: Secondary | ICD-10-CM | POA: Diagnosis not present

## 2014-03-04 DIAGNOSIS — Z7982 Long term (current) use of aspirin: Secondary | ICD-10-CM | POA: Insufficient documentation

## 2014-03-04 DIAGNOSIS — N898 Other specified noninflammatory disorders of vagina: Secondary | ICD-10-CM | POA: Insufficient documentation

## 2014-03-04 DIAGNOSIS — J45901 Unspecified asthma with (acute) exacerbation: Secondary | ICD-10-CM | POA: Insufficient documentation

## 2014-03-04 LAB — CBC WITH DIFFERENTIAL/PLATELET
BASOS ABS: 0 10*3/uL (ref 0.0–0.1)
Basophils Relative: 0 % (ref 0–1)
EOS PCT: 1 % (ref 0–5)
Eosinophils Absolute: 0.2 10*3/uL (ref 0.0–0.7)
HCT: 30.4 % — ABNORMAL LOW (ref 36.0–46.0)
HEMOGLOBIN: 10 g/dL — AB (ref 12.0–15.0)
Lymphocytes Relative: 22 % (ref 12–46)
Lymphs Abs: 3 10*3/uL (ref 0.7–4.0)
MCH: 25.2 pg — ABNORMAL LOW (ref 26.0–34.0)
MCHC: 32.9 g/dL (ref 30.0–36.0)
MCV: 76.6 fL — AB (ref 78.0–100.0)
MONO ABS: 0.7 10*3/uL (ref 0.1–1.0)
Monocytes Relative: 5 % (ref 3–12)
NEUTROS ABS: 9.4 10*3/uL — AB (ref 1.7–7.7)
Neutrophils Relative %: 72 % (ref 43–77)
PLATELETS: 386 10*3/uL (ref 150–400)
RBC: 3.97 MIL/uL (ref 3.87–5.11)
RDW: 14.6 % (ref 11.5–15.5)
WBC: 13.3 10*3/uL — AB (ref 4.0–10.5)

## 2014-03-04 LAB — COMPREHENSIVE METABOLIC PANEL
ALBUMIN: 4.1 g/dL (ref 3.5–5.2)
ALK PHOS: 75 U/L (ref 39–117)
ALT: 16 U/L (ref 0–35)
ANION GAP: 7 (ref 5–15)
AST: 23 U/L (ref 0–37)
BUN: 8 mg/dL (ref 6–23)
CHLORIDE: 102 meq/L (ref 96–112)
CO2: 30 mmol/L (ref 19–32)
Calcium: 9.7 mg/dL (ref 8.4–10.5)
Creatinine, Ser: 0.85 mg/dL (ref 0.50–1.10)
GLUCOSE: 96 mg/dL (ref 70–99)
POTASSIUM: 3.8 mmol/L (ref 3.5–5.1)
SODIUM: 139 mmol/L (ref 135–145)
Total Bilirubin: 0.8 mg/dL (ref 0.3–1.2)
Total Protein: 7.7 g/dL (ref 6.0–8.3)

## 2014-03-04 LAB — URINALYSIS, ROUTINE W REFLEX MICROSCOPIC
Bilirubin Urine: NEGATIVE
Glucose, UA: NEGATIVE mg/dL
HGB URINE DIPSTICK: NEGATIVE
Ketones, ur: NEGATIVE mg/dL
NITRITE: NEGATIVE
Protein, ur: NEGATIVE mg/dL
Specific Gravity, Urine: 1.021 (ref 1.005–1.030)
UROBILINOGEN UA: 0.2 mg/dL (ref 0.0–1.0)
pH: 5.5 (ref 5.0–8.0)

## 2014-03-04 LAB — URINE MICROSCOPIC-ADD ON

## 2014-03-04 LAB — LIPASE, BLOOD: LIPASE: 24 U/L (ref 11–59)

## 2014-03-04 LAB — POC URINE PREG, ED: Preg Test, Ur: NEGATIVE

## 2014-03-04 MED ORDER — KETOROLAC TROMETHAMINE 30 MG/ML IJ SOLN
30.0000 mg | Freq: Once | INTRAMUSCULAR | Status: AC
  Administered 2014-03-04: 30 mg via INTRAVENOUS
  Filled 2014-03-04: qty 1

## 2014-03-04 MED ORDER — NAPROXEN 500 MG PO TABS: 500.0000 mg | ORAL_TABLET | Freq: Two times a day (BID) | ORAL | Status: DC

## 2014-03-04 NOTE — ED Provider Notes (Signed)
CSN: [id number]     Arrival date & time 03/04/14  9562 History   This chart was scribed for Lindsay Cookey, MD by Lindsay Perez, ED Scribe. This patient was seen in room B18C/B18C and the patient's care was started at 12:51 AM.    Chief Complaint  Patient presents with  . Flank Pain  . Abdominal Pain   Patient is a 24 y.o. female presenting with abdominal pain. The history is provided by the patient. No language interpreter was used.  Abdominal Pain Pain location:  R flank and RUQ Pain quality: sharp and stabbing   Pain severity:  Mild Onset quality:  Gradual Duration:  1 week Timing:  Constant Progression:  Worsening Chronicity:  New Context: not recent travel   Worsened by:  Movement, deep breathing and coughing Associated symptoms: cough, diarrhea, shortness of breath and vaginal discharge   Associated symptoms: no chills, no fever, no hematuria, no vaginal bleeding and no vomiting    HPI Comments: Lindsay Perez is a 24 y.o. female who presents to the Emergency Department complaining of worsening sharp-stabbing right flank pain and RUQ abdominal pain onset 1-2 weeks prior. Pt states she has associated nausea, diarrhea, dry cough, SOB and HA. Pt states that movement, coughing and deep breathing makes the pain worse. Pt states she has had some vaginal discharge that she has recently been evaluated at [facility] 4 days prior.  He was given a prescription for azithromycin, which she filled today. Pt denies fever, chills, vomiting, dysuria, vaginal bleeding or hematuria. Pt states she just started birthcontrol today.  LMP 1/1/206  Past Medical History  Diagnosis Date  . Seasonal allergies   . Headache(784.0)     migranes  . Anemia     as a child  . Asthma     as a child   Past Surgical History  Procedure Laterality Date  . No past surgeries     Family History  Problem Relation Age of Onset  . Drug abuse Mother   . Mental illness Brother   . Mental retardation  Brother    History  Substance Use Topics  . Smoking status: Never Smoker   . Smokeless tobacco: Never Used  . Alcohol Use: 11.4 oz/week    5 Glasses of wine, 14 Shots of liquor per week     Comment: occasionally   OB History    Gravida Para Term Preterm AB TAB SAB Ectopic Multiple Living   0      Review of Systems  Constitutional: Negative for fever and chills.  Respiratory: Positive for cough and shortness of breath.   Gastrointestinal: Positive for abdominal pain and diarrhea. Negative for vomiting.  Genitourinary: Positive for flank pain and vaginal discharge. Negative for hematuria and vaginal bleeding.  All other systems reviewed and are negative.    Allergies  Shellfish allergy  Home Medications   Prior to Admission medications   Medication Sig Start Date End Date Taking? Authorizing Provider  acetaminophen (TYLENOL) 500 MG tablet Take 1,000-1,500 mg by mouth every 6 (six) hours as needed for mild pain or headache (toothpain).    Yes Historical Provider, MD  albuterol (PROVENTIL HFA;VENTOLIN HFA) 108 (90 BASE) MCG/ACT inhaler Inhale into the lungs every 6 (six) hours as needed for wheezing or shortness of breath.   Yes Historical Provider, MD  aspirin-acetaminophen-caffeine (EXCEDRIN MIGRAINE) [phone] MG per tablet Take 3 tablets by mouth every 6 (six) hours as needed for  headache.   Yes Historical Provider, MD  ibuprofen (ADVIL,MOTRIN) 600 MG tablet Take 1 tablet (600 mg total) by mouth 3 (three) times daily. 02/28/14  Yes Lisa A Leftwich-Kirby, CNM  norgestimate-ethinyl estradiol (ORTHO-CYCLEN,SPRINTEC,PREVIFEM) 0.25-35 MG-MCG tablet Take 1 tablet by mouth daily. 02/28/14  Yes Lisa A Leftwich-Kirby, CNM  azithromycin (ZITHROMAX) 500 MG tablet Take 2 tablets (1,000 mg total) by mouth daily. 03/03/14   Tereso Newcomer, MD  naproxen (NAPROSYN) 500 MG tablet Take 1 tablet (500 mg total) by mouth 2 (two) times daily. 03/04/14   Lindsay Cookey, MD   BP 126/60  mmHg  Pulse 50  Temp(Src) 98.1 F (36.7 C) (Oral)  Resp 18  Ht  (1.549 m)  Wt 207 lb (93.895 kg)  BMI 39.13 kg/m2  SpO2 100%  LMP 02/13/2014 (Exact Date)   Physical Exam  Constitutional: She is oriented to person, place, and time. She appears well-developed and well-nourished. No distress.  HENT:  Head: Normocephalic.  Mouth/Throat: Oropharynx is clear and moist.  Eyes: Pupils are equal, round, and reactive to light.  Neck: Neck supple.  Cardiovascular: Normal rate, regular rhythm and normal heart sounds.      Pulmonary/Chest: Effort normal and breath sounds normal. No respiratory distress. She has no wheezes. She exhibits tenderness.  Right lateral chest wall tenderness.  Abdominal: Soft. She exhibits no distension. There is tenderness in the right upper quadrant. There is no rigidity, no rebound and no guarding.  Musculoskeletal: She exhibits no edema or tenderness.  Neurological: She is alert and oriented to person, place, and time.  Skin: Skin is warm and dry.  Psychiatric: She has a normal mood and affect.  Nursing note and vitals reviewed.   ED Course  Procedures (including critical care time) DIAGNOSTIC STUDIES: Oxygen Saturation is 100% on RA, normal by my interpretation.    COORDINATION OF CARE: 1:16 AM-Discussed treatment plan with pt at bedside and pt agreed to plan.     Labs Review Labs Reviewed  URINALYSIS, ROUTINE W REFLEX MICROSCOPIC - Abnormal; Notable for the following:    APPearance CLOUDY (*)    Leukocytes, UA SMALL (*)    All other components within normal limits  CBC WITH DIFFERENTIAL - Abnormal; Notable for the following:    WBC 13.3 (*)    Hemoglobin 10.0 (*)    HCT 30.4 (*)    MCV 76.6 (*)    MCH 25.2 (*)    Neutro Abs 9.4 (*)    All other components within normal limits  URINE MICROSCOPIC-ADD ON - Abnormal; Notable for the following:    Squamous Epithelial / LPF FEW (*)    Bacteria, UA FEW (*)    All other components within normal  limits  URINE CULTURE  COMPREHENSIVE METABOLIC PANEL  LIPASE, BLOOD  POC URINE PREG, ED    Imaging Review Dg Chest 2 View  03/04/2014   CLINICAL DATA:  Rt lower lateral chest pain, Some sob, and pain with deep breath  EXAM: CHEST  2 VIEW  COMPARISON:  None.  FINDINGS: Normal heart, mediastinum and hila. Clear lungs. No pleural effusion or pneumothorax. Bony thorax is unremarkable.  IMPRESSION: Normal chest radiographs.   Electronically Signed   By: Amie Portland M.D.   On: 03/04/2014 01:52   US Abdomen Limited Ruq  03/04/2014   CLINICAL DATA:  Right upper quadrant pain.  EXAM: US ABDOMEN LIMITED - RIGHT UPPER QUADRANT  COMPARISON:  None.  FINDINGS: Gallbladder:  No gallstones or wall thickening visualized. No  sonographic Murphy sign noted.  Common bile duct:  Diameter: 2.8 mm  Liver:  No focal lesion identified. Within normal limits in parenchymal echogenicity.  IMPRESSION: Normal right upper quadrant ultrasound.   Electronically Signed   By: Amie Portlandavid  Ormond M.D.   On: 03/04/2014 02:08     EKG Interpretation None      MDM   Final diagnoses:  Right-sided chest pain   Pt is a 24 y.o. female with Pmhx as above who presents with 1-2 week of RUQ/R lateral chest wall pain.  Pain is sharp, worse with cough, movement, deep breathing. She denies fever, chills, SOB, leg pain/swelling. She started birth control yesterday, PERC criteria otherwise negative and symptoms preceeding onset of birthcontrol. Pain not worse w/ PO intake. On PE, she has reproducible R lateral chest wall pain and RUQ pain w/o rebound or guarding.    workup with mild white blood cell count elevation, normal CMP, lipase, negative pregnancy.  Urine does not appear infected.  Chest x-ray is normal and right upper quadrant ultrasound is also normal.  I doubt PE, ACS, cholecystitis, pneumonia.  I feel pain is more likely to be pleurisy or muscular skeletal chest wall pain.  She feels somewhat improved after dose of Toradol.  I feel she  would benefit from scheduled NSAIDs at home.    Lynda Rainwaterhelsea Royle evaluation in the Emergency Department is complete. It has been determined that no acute conditions requiring further emergency intervention are present at this time. The patient/guardian have been advised of the diagnosis and plan. We have discussed signs and symptoms that warrant return to the ED, such as changes or worsening in symptoms, worsening pain, fever, shortness of breath.  I personally performed the services described in this documentation, which was scribed in my presence. The recorded information has been reviewed and is accurate.       Lindsay CookeyMegan Docherty, MD 03/04/14 [phone]

## 2014-03-04 NOTE — Discharge Instructions (Signed)

## 2014-03-04 NOTE — ED Notes (Signed)
Pt c/o of right flank pain and RUQ pain. Pt denies vomiting but has been nauseated.

## 2014-03-05 LAB — URINE CULTURE
Colony Count: NO GROWTH
Culture: NO GROWTH

## 2014-03-10 ENCOUNTER — Ambulatory Visit: Payer: No Typology Code available for payment source | Admitting: Family Medicine

## 2014-05-06 ENCOUNTER — Encounter (HOSPITAL_COMMUNITY): Payer: Self-pay | Admitting: *Deleted

## 2014-05-06 ENCOUNTER — Inpatient Hospital Stay (HOSPITAL_COMMUNITY)
Admission: AD | Admit: 2014-05-06 | Discharge: 2014-05-06 | Disposition: A | Discharge status: Home / Self Care | Payer: PRIVATE HEALTH INSURANCE | Source: Ambulatory Visit | Attending: Family Medicine | Admitting: Family Medicine

## 2014-05-06 DIAGNOSIS — A499 Bacterial infection, unspecified: Secondary | ICD-10-CM | POA: Diagnosis not present

## 2014-05-06 DIAGNOSIS — N760 Acute vaginitis: Secondary | ICD-10-CM | POA: Diagnosis not present

## 2014-05-06 DIAGNOSIS — B9689 Other specified bacterial agents as the cause of diseases classified elsewhere: Secondary | ICD-10-CM | POA: Diagnosis not present

## 2014-05-06 DIAGNOSIS — N898 Other specified noninflammatory disorders of vagina: Secondary | ICD-10-CM | POA: Diagnosis present

## 2014-05-06 DIAGNOSIS — Z8619 Personal history of other infectious and parasitic diseases: Secondary | ICD-10-CM | POA: Insufficient documentation

## 2014-05-06 HISTORY — DX: Chlamydial infection, unspecified: A74.9

## 2014-05-06 HISTORY — DX: Trichomoniasis, unspecified: A59.9

## 2014-05-06 LAB — URINALYSIS, ROUTINE W REFLEX MICROSCOPIC
BILIRUBIN URINE: NEGATIVE
Glucose, UA: NEGATIVE mg/dL
Hgb urine dipstick: NEGATIVE
Ketones, ur: NEGATIVE mg/dL
Leukocytes, UA: NEGATIVE
NITRITE: NEGATIVE
PROTEIN: NEGATIVE mg/dL
Specific Gravity, Urine: >1.03 — ABNORMAL HIGH (ref 1.005–1.030)
UROBILINOGEN UA: 0.2 mg/dL (ref 0.0–1.0)
pH: 5.5 (ref 5.0–8.0)

## 2014-05-06 LAB — WET PREP, GENITAL
Trich, Wet Prep: NONE SEEN
YEAST WET PREP: NONE SEEN

## 2014-05-06 LAB — POCT PREGNANCY, URINE: PREG TEST UR: NEGATIVE

## 2014-05-06 MED ORDER — METRONIDAZOLE 500 MG PO TABS: 500.0000 mg | ORAL_TABLET | Freq: Two times a day (BID) | ORAL | Status: DC

## 2014-05-06 NOTE — MAU Provider Note (Signed)
Chief Complaint: Vaginal Discharge   First Provider Initiated Contact with Patient 05/06/14 1914     SUBJECTIVE HPI: Lindsay Perez is a 24 y.o. G1P0010 female who presents to [facility] reporting noticing vaginal odor today. No new sex partners.   Past Medical History  Diagnosis Date  . Seasonal allergies   . Headache(784.0)     migranes  . Anemia     as a child  . Asthma     as a child  . Trichomonas infection   . Chlamydia    OB History  Gravida Para Term Preterm AB SAB TAB Ectopic Multiple Living  0    # Outcome Date GA Lbr Len/2nd Weight Sex Delivery Anes PTL Lv  1 SAB              Past Surgical History  Procedure Laterality Date  . No past surgeries     History   Social History  . Marital Status: Single    Spouse Name: N/A  . Number of Children: 0   . Years of Education: some colle   Occupational History  . does many duties      Conservation officer, [employer]    Social History Main Topics  . Smoking status: Never Smoker   . Smokeless tobacco: Never Used  . Alcohol Use: 11.4 oz/week    5 Glasses of wine, 14 Shots of liquor per week     Comment: occasionally  . Drug Use: 7.00 per week    Special: Marijuana     Comment: Uses marijuana everyday. Takes xanax occas.  . Sexual Activity: Yes    Birth Control/ Protection: None, Condom   Other Topics Concern  . Not on file   Social History Narrative   Lives with nephew (22).    Some college, starting back in January.   [name], age 2.        No current facility-administered medications on file prior to encounter.   Current Outpatient Prescriptions on File Prior to Encounter  Medication Sig Dispense Refill  . acetaminophen (TYLENOL) 500 MG tablet Take 1,000-1,500 mg by mouth every 6 (six) hours as needed for mild pain or headache (toothpain).     Marland Kitchen albuterol (PROVENTIL HFA;VENTOLIN HFA) 108 (90 BASE) MCG/ACT inhaler Inhale into the lungs every 6 (six) hours as needed for  wheezing or shortness of breath.    Marland Kitchen aspirin-acetaminophen-caffeine (EXCEDRIN MIGRAINE) 250-250-65 MG per tablet Take 3 tablets by mouth every 6 (six) hours as needed for headache.    Marland Kitchen azithromycin (ZITHROMAX) 500 MG tablet Take 2 tablets (1,000 mg total) by mouth daily. 2 tablet 0  . ibuprofen (ADVIL,MOTRIN) 600 MG tablet Take 1 tablet (600 mg total) by mouth 3 (three) times daily. 30 tablet 1  . naproxen (NAPROSYN) 500 MG tablet Take 1 tablet (500 mg total) by mouth 2 (two) times daily. 20 tablet 0  . norgestimate-ethinyl estradiol (ORTHO-CYCLEN,SPRINTEC,PREVIFEM) 0.25-35 MG-MCG tablet Take 1 tablet by mouth daily. 1 Package 11   Allergies  Allergen Reactions  . Shellfish Allergy Swelling   Review of Systems  Constitutional: Negative for fever and chills.  Gastrointestinal: Negative for abdominal pain.  Genitourinary:       Neg for vaginal discharge, vaginal bleeding or dyspareunia.   Skin: Negative for itching.   OBJECTIVE Blood pressure 131/75, pulse 79, temperature 98.2 F (36.8 C), temperature source Oral, resp. rate 18, height 5' 0.5" (1.537 m), weight 93.895 kg (  207 lb), last menstrual period 04/27/2014. GENERAL: Well-developed, well-nourished female in no acute distress.  HEART: normal rate RESP: normal effort GI: Abdomen soft, non-tender.  NEURO: Alert and oriented SPECULUM EXAM: Declined. Blind collection of Wet prep, GC/Chlamydia  LAB RESULTS Results for orders placed or performed during the hospital encounter of 05/06/14 (from the past 24 hour(s))  Pregnancy, urine POC     Status: None   Collection Time: 05/06/14  4:40 PM  Result Value Ref Range   Preg Test, Ur NEGATIVE NEGATIVE  Urinalysis, Routine w reflex microscopic     Status: Abnormal   Collection Time: 05/06/14  4:42 PM  Result Value Ref Range   Color, Urine YELLOW YELLOW   APPearance CLEAR CLEAR   Specific Gravity, Urine >1.030 (H) 1.005 - 1.030   pH 5.5 5.0 - 8.0   Glucose, UA NEGATIVE NEGATIVE mg/dL    Hgb urine dipstick NEGATIVE NEGATIVE   Bilirubin Urine NEGATIVE NEGATIVE   Ketones, ur NEGATIVE NEGATIVE mg/dL   Protein, ur NEGATIVE NEGATIVE mg/dL   Urobilinogen, UA 0.2 0.0 - 1.0 mg/dL   Nitrite NEGATIVE NEGATIVE   Leukocytes, UA NEGATIVE NEGATIVE  Wet prep, genital     Status: Abnormal   Collection Time: 05/06/14  6:18 PM  Result Value Ref Range   Yeast Wet Prep HPF POC NONE SEEN NONE SEEN   Trich, Wet Prep NONE SEEN NONE SEEN   Clue Cells Wet Prep HPF POC MODERATE (A) NONE SEEN   WBC, Wet Prep HPF POC FEW (A) NONE SEEN  GC/Chlamydia Probe Amp *Canceled*     Status: None ()   Collection Time: 05/06/14  6:27 PM   Narrative   LIS Cancel (ORR/DE = Data Error)    IMAGING No results found.  MAU COURSE  ASSESSMENT 1. BV (bacterial vaginosis)     PLAN Discharge home in stable condition. GC/Chlamydia pending. List of Gynecologists given.      Follow-up Information    Follow up with Gynecologist.   Why:  As needed for routine gynecology care and problem gynecology appointments      [facility].   Why:  As needed or gynecologic emergencies   Contact information:   [address] [id number] [place] [phone]       Medication List    STOP taking these medications        naproxen 500 MG tablet  Commonly known as:  NAPROSYN      TAKE these medications        acetaminophen 500 MG tablet  Commonly known as:  TYLENOL  Take 1,000-1,500 mg by mouth every 6 (six) hours as needed for mild pain or headache (toothpain).     albuterol 108 (90 BASE) MCG/ACT inhaler  Commonly known as:  PROVENTIL HFA;VENTOLIN HFA  Inhale into the lungs every 6 (six) hours as needed for wheezing or shortness of breath.     aspirin-acetaminophen-caffeine 250-250-65 MG per tablet  Commonly known as:  EXCEDRIN MIGRAINE  Take 3 tablets by mouth every 6 (six) hours as needed for headache.      azithromycin 500 MG tablet  Commonly known as:  ZITHROMAX  Take 2 tablets (1,000 mg total) by mouth daily.     ibuprofen 600 MG tablet  Commonly known as:  ADVIL,MOTRIN  Take 1 tablet (600 mg total) by mouth 3 (three) times daily.     metroNIDAZOLE 500 MG tablet  Commonly known as:  FLAGYL  Take 1 tablet (500 mg total) by mouth 2 (two) times daily.     norgestimate-ethinyl estradiol 0.25-35 MG-MCG tablet  Commonly known as:  ORTHO-CYCLEN,SPRINTEC,PREVIFEM  Take 1 tablet by mouth daily.       [place], [place] 05/06/2014  7:24 PM

## 2014-05-06 NOTE — Discharge Instructions (Signed)
Bacterial Vaginosis Bacterial vaginosis is a vaginal infection that occurs when the normal balance of bacteria in the vagina is disrupted. It results from an overgrowth of certain bacteria. This is the most common vaginal infection in women of childbearing age. Treatment is important to prevent complications, especially in pregnant women, as it can cause a premature delivery. CAUSES  Bacterial vaginosis is caused by an increase in harmful bacteria that are normally present in smaller amounts in the vagina. Several different kinds of bacteria can cause bacterial vaginosis. However, the reason that the condition develops is not fully understood. RISK FACTORS Certain activities or behaviors can put you at an increased risk of developing bacterial vaginosis, including:  Having a new sex partner or multiple sex partners.  Douching.  Using an intrauterine device (IUD) for contraception. Women do not get bacterial vaginosis from toilet seats, bedding, swimming pools, or contact with objects around them. SIGNS AND SYMPTOMS  Some women with bacterial vaginosis have no signs or symptoms. Common symptoms include:  Grey vaginal discharge.  A fishlike odor with discharge, especially after sexual intercourse.  Itching or burning of the vagina and vulva.  Burning or pain with urination. DIAGNOSIS  Your health care provider will take a medical history and examine the vagina for signs of bacterial vaginosis. A sample of vaginal fluid may be taken. Your health care provider will look at this sample under a microscope to check for bacteria and abnormal cells. A vaginal pH test may also be done.  TREATMENT  Bacterial vaginosis may be treated with antibiotic medicines. These may be given in the form of a pill or a vaginal cream. A second round of antibiotics may be prescribed if the condition comes back after treatment.  HOME CARE INSTRUCTIONS   Only take over-the-counter or prescription medicines as  directed by your health care provider.  If antibiotic medicine was prescribed, take it as directed. Make sure you finish it even if you start to feel better.  Do not have sex until treatment is completed.  Tell all sexual partners that you have a vaginal infection. They should see their health care provider and be treated if they have problems, such as a mild rash or itching.  Practice safe sex by using condoms and only having one sex partner. SEEK MEDICAL CARE IF:   Your symptoms are not improving after 3 days of treatment.  You have increased discharge or pain.  You have a fever. MAKE SURE YOU:   Understand these instructions.  Will watch your condition.  Will get help right away if you are not doing well or get worse. FOR MORE INFORMATION  Centers for Disease Control and Prevention, Division of STD Prevention: www.cdc.gov/std American Sexual Health Association (ASHA): www.ashastd.org  Document Released: 01/31/2005 Document Revised: 11/21/2012 Document Reviewed: 09/12/2012 ExitCare Patient Information 2015 [employer]. This information is not intended to replace advice given to you by your health care provider. Make sure you discuss any questions you have with your health care provider.  

## 2014-05-06 NOTE — MAU Note (Signed)
Noted a strange odor when used restroom, never noted before.

## 2014-05-07 LAB — GC/CHLAMYDIA PROBE AMP (~~LOC~~) NOT AT ARMC
Chlamydia: NEGATIVE
Neisseria Gonorrhea: NEGATIVE

## 2015-02-04 ENCOUNTER — Inpatient Hospital Stay (HOSPITAL_COMMUNITY)
Admission: AD | Admit: 2015-02-04 | Discharge: 2015-02-04 | Disposition: A | Discharge status: Home / Self Care | Payer: No Typology Code available for payment source | Source: Ambulatory Visit | Attending: Family Medicine | Admitting: Family Medicine

## 2015-02-04 ENCOUNTER — Encounter (HOSPITAL_COMMUNITY): Payer: Self-pay

## 2015-02-04 DIAGNOSIS — R109 Unspecified abdominal pain: Secondary | ICD-10-CM | POA: Diagnosis present

## 2015-02-04 DIAGNOSIS — K219 Gastro-esophageal reflux disease without esophagitis: Secondary | ICD-10-CM

## 2015-02-04 DIAGNOSIS — Z3202 Encounter for pregnancy test, result negative: Secondary | ICD-10-CM | POA: Insufficient documentation

## 2015-02-04 DIAGNOSIS — B9689 Other specified bacterial agents as the cause of diseases classified elsewhere: Secondary | ICD-10-CM

## 2015-02-04 DIAGNOSIS — A499 Bacterial infection, unspecified: Secondary | ICD-10-CM | POA: Diagnosis not present

## 2015-02-04 DIAGNOSIS — Z308 Encounter for other contraceptive management: Secondary | ICD-10-CM

## 2015-02-04 DIAGNOSIS — Z113 Encounter for screening for infections with a predominantly sexual mode of transmission: Secondary | ICD-10-CM | POA: Diagnosis not present

## 2015-02-04 DIAGNOSIS — N760 Acute vaginitis: Secondary | ICD-10-CM

## 2015-02-04 LAB — URINALYSIS, ROUTINE W REFLEX MICROSCOPIC
BILIRUBIN URINE: NEGATIVE
Glucose, UA: NEGATIVE mg/dL
Hgb urine dipstick: NEGATIVE
KETONES UR: NEGATIVE mg/dL
LEUKOCYTES UA: NEGATIVE
NITRITE: NEGATIVE
PH: 6 (ref 5.0–8.0)
PROTEIN: NEGATIVE mg/dL
Specific Gravity, Urine: 1.02 (ref 1.005–1.030)

## 2015-02-04 LAB — AMYLASE: AMYLASE: 102 U/L — AB (ref 28–100)

## 2015-02-04 LAB — LIPASE, BLOOD: Lipase: 32 U/L (ref 11–51)

## 2015-02-04 LAB — COMPREHENSIVE METABOLIC PANEL
ALBUMIN: 3.6 g/dL (ref 3.5–5.0)
ALK PHOS: 67 U/L (ref 38–126)
ALT: 19 U/L (ref 14–54)
ANION GAP: 6 (ref 5–15)
AST: 23 U/L (ref 15–41)
BILIRUBIN TOTAL: 0.5 mg/dL (ref 0.3–1.2)
BUN: 15 mg/dL (ref 6–20)
CALCIUM: 9.2 mg/dL (ref 8.9–10.3)
CO2: 29 mmol/L (ref 22–32)
Chloride: 104 mmol/L (ref 101–111)
Creatinine, Ser: 0.76 mg/dL (ref 0.44–1.00)
GFR calc Af Amer: >60 mL/min (ref >60)
GLUCOSE: 100 mg/dL — AB (ref 65–99)
POTASSIUM: 4.5 mmol/L (ref 3.5–5.1)
Sodium: 139 mmol/L (ref 135–145)
TOTAL PROTEIN: 7.8 g/dL (ref 6.5–8.1)

## 2015-02-04 LAB — POCT PREGNANCY, URINE: PREG TEST UR: NEGATIVE

## 2015-02-04 LAB — CBC
HCT: 36 % (ref 36.0–46.0)
Hemoglobin: 11.6 g/dL — ABNORMAL LOW (ref 12.0–15.0)
MCH: 25.8 pg — ABNORMAL LOW (ref 26.0–34.0)
MCHC: 32.2 g/dL (ref 30.0–36.0)
MCV: 80.2 fL (ref 78.0–100.0)
PLATELETS: 303 10*3/uL (ref 150–400)
RBC: 4.49 MIL/uL (ref 3.87–5.11)
RDW: 15.1 % (ref 11.5–15.5)
WBC: 6.5 10*3/uL (ref 4.0–10.5)

## 2015-02-04 LAB — WET PREP, GENITAL
Sperm: NONE SEEN
Trich, Wet Prep: NONE SEEN
YEAST WET PREP: NONE SEEN

## 2015-02-04 MED ORDER — OMEPRAZOLE 20 MG PO CPDR: 20.0000 mg | DELAYED_RELEASE_CAPSULE | Freq: Every day | ORAL | Status: DC

## 2015-02-04 MED ORDER — NORGESTIMATE-ETH ESTRADIOL 0.25-35 MG-MCG PO TABS: 1.0000 | ORAL_TABLET | Freq: Every day | ORAL | Status: DC

## 2015-02-04 MED ORDER — METRONIDAZOLE 500 MG PO TABS: 500.0000 mg | ORAL_TABLET | Freq: Two times a day (BID) | ORAL | Status: DC

## 2015-02-04 NOTE — MAU Provider Note (Signed)
History     CSN: [id number]  Arrival date and time: 02/04/15 1048   First Provider Initiated Contact with Patient 02/04/15 1126       Chief Complaint  Patient presents with  . Abdominal Pain   Lindsay Perez is a 24 y.o. female who presents with abdominal pain & vaginal discharge. Also requesting STD testing & contraception at this time. Reports recent history of heartburn that has worsened since starting a job at [employer].   Abdominal Pain This is a new problem. The current episode started 1 to 4 weeks ago (2 weeks). The problem occurs intermittently. The problem has been unchanged. The pain is located in the RUQ and epigastric region. The pain is at a severity of 8/10. The quality of the pain is burning and aching. The abdominal pain does not radiate. Pertinent negatives include no belching, constipation, diarrhea, dysuria, fever, frequency, hematuria, nausea or vomiting. Associated symptoms comments: heartburn. The pain is aggravated by eating. The pain is relieved by nothing. She has tried nothing for the symptoms. There is no history of abdominal surgery, gallstones or pancreatitis.  Female GU Problem The patient's primary symptoms include a genital odor and vaginal discharge. The patient's pertinent negatives include no genital itching, pelvic pain or vaginal bleeding. This is a new problem. The current episode started in the past 7 days. The problem has been unchanged. The patient is experiencing no pain. She is not pregnant. Associated symptoms include abdominal pain. Pertinent negatives include no chills, constipation, diarrhea, dysuria, fever, flank pain, frequency, hematuria, nausea, painful intercourse or vomiting. The vaginal discharge was grey, thin and malodorous. There has been no bleeding. Nothing aggravates the symptoms. She has tried nothing for the symptoms. She is sexually active. It is possible that her partner has an STD. She uses oral contraceptives (Just  finished last rx of OCP on Sunday - requesting refill) for contraception. Her menstrual history has been regular. There is no history of an abdominal surgery, endometriosis, a gynecological surgery, PID or an STD.    OB History    Gravida Para Term Preterm AB TAB SAB Ectopic Multiple Living   0      Past Medical History  Diagnosis Date  . Seasonal allergies   . Headache(784.0)     migranes  . Anemia     as a child  . Asthma     as a child  . Trichomonas infection   . Chlamydia     Past Surgical History  Procedure Laterality Date  . No past surgeries      Family History  Problem Relation Age of Onset  . Drug abuse Mother   . Mental illness Brother   . Mental retardation Brother     Social History  Substance Use Topics  . Smoking status: Never Smoker   . Smokeless tobacco: Never Used  . Alcohol Use: 11.4 oz/week    5 Glasses of wine, 14 Shots of liquor per week     Comment: occasionally    Allergies:  Allergies  Allergen Reactions  . Shellfish Allergy Swelling    Prescriptions prior to admission  Medication Sig Dispense Refill Last Dose  . acetaminophen (TYLENOL) 500 MG tablet Take 1,000-1,500 mg by mouth every 6 (six) hours as needed for mild pain or headache (toothpain).    Past Week at Unknown time  . albuterol (PROVENTIL HFA;VENTOLIN HFA) 108 (90 BASE) MCG/ACT inhaler Inhale into the lungs every  6 (six) hours as needed for wheezing or shortness of breath.   unknown  . aspirin-acetaminophen-caffeine (EXCEDRIN MIGRAINE) 250-250-65 MG per tablet Take 3 tablets by mouth every 6 (six) hours as needed for headache.   Past Week at Unknown time  . azithromycin (ZITHROMAX) 500 MG tablet Take 2 tablets (1,000 mg total) by mouth daily. (Patient not taking: Reported on 05/06/2014) 2 tablet 0 Not Taking at Unknown time  . ibuprofen (ADVIL,MOTRIN) 600 MG tablet Take 1 tablet (600 mg total) by mouth 3 (three) times daily. 30 tablet 1 Past Week at Unknown time  .  ibuprofen (ADVIL,MOTRIN) 800 MG tablet Take 1,600 mg by mouth every 8 (eight) hours as needed for moderate pain (Used for tooth pain.).   Past Week at Unknown time  . metroNIDAZOLE (FLAGYL) 500 MG tablet Take 1 tablet (500 mg total) by mouth 2 (two) times daily. 14 tablet 0   . naproxen sodium (ANAPROX) 220 MG tablet Take 660 mg by mouth daily as needed.   05/06/2014 at 1500  . norgestimate-ethinyl estradiol (ORTHO-CYCLEN,SPRINTEC,PREVIFEM) 0.25-35 MG-MCG tablet Take 1 tablet by mouth daily. 1 Package 11 05/06/2014 at 0800    Review of Systems  Constitutional: Negative for fever and chills.  Respiratory: Negative for cough.   Cardiovascular: Negative.   Gastrointestinal: Positive for heartburn and abdominal pain. Negative for nausea, vomiting, diarrhea and constipation.  Genitourinary: Positive for vaginal discharge. Negative for dysuria, frequency, hematuria, flank pain and pelvic pain.       + vaginal discharge with odor No vaginal bleeding, dyspareunia, or post coital bleeding   Physical Exam   Blood pressure 140/97, pulse 67, temperature 98.1 F (36.7 C), temperature source Oral, resp. rate 20, height 5\' 1"  (1.549 m), weight 208 lb 9.6 oz (94.62 kg), last menstrual period 01/29/2015.  Physical Exam  Nursing note and vitals reviewed. Constitutional: She is oriented to person, place, and time. She appears well-developed and well-nourished. No distress.  HENT:  Head: Normocephalic and atraumatic.  Eyes: Conjunctivae are normal. Right eye exhibits no discharge. Left eye exhibits no discharge. No scleral icterus.  Neck: Normal range of motion.  Cardiovascular: Normal rate, regular rhythm and normal heart sounds.   No murmur heard. Respiratory: Effort normal and breath sounds normal. No respiratory distress. She has no wheezes.  GI: Soft. Bowel sounds are normal. She exhibits no distension and no mass. There is no tenderness. There is no rebound, no guarding and negative Murphy's sign.   Genitourinary: Vagina normal and uterus normal. Cervix exhibits discharge (small amount of thin grey discharge with mild odor). Cervix exhibits no motion tenderness and no friability.  Neurological: She is alert and oriented to person, place, and time.  Skin: Skin is warm and dry. She is not diaphoretic.  Psychiatric: She has a normal mood and affect. Her behavior is normal. Judgment and thought content normal.    MAU Course  Procedures Results for orders placed or performed during the hospital encounter of 02/04/15 (from the past 24 hour(s))  Urinalysis, Routine w reflex microscopic (not [facility])     Status: None   Collection Time: 02/04/15 11:00 AM  Result Value Ref Range   Color, Urine YELLOW YELLOW   APPearance CLEAR CLEAR   Specific Gravity, Urine 1.020 1.005 - 1.030   pH 6.0 5.0 - 8.0   Glucose, UA NEGATIVE NEGATIVE mg/dL   Hgb urine dipstick NEGATIVE NEGATIVE   Bilirubin Urine NEGATIVE NEGATIVE   Ketones, ur NEGATIVE NEGATIVE mg/dL   Protein, ur NEGATIVE  NEGATIVE mg/dL   Nitrite NEGATIVE NEGATIVE   Leukocytes, UA NEGATIVE NEGATIVE  Pregnancy, urine POC     Status: None   Collection Time: 02/04/15 11:12 AM  Result Value Ref Range   Preg Test, Ur NEGATIVE NEGATIVE  Wet prep, genital     Status: Abnormal   Collection Time: 02/04/15 11:50 AM  Result Value Ref Range   Yeast Wet Prep HPF POC NONE SEEN NONE SEEN   Trich, Wet Prep NONE SEEN NONE SEEN   Clue Cells Wet Prep HPF POC FEW (A) NONE SEEN   WBC, Wet Prep HPF POC FEW (A) NONE SEEN   Sperm NONE SEEN   CBC     Status: Abnormal   Collection Time: 02/04/15 11:57 AM  Result Value Ref Range   WBC 6.5 4.0 - 10.5 K/uL   RBC 4.49 3.87 - 5.11 MIL/uL   Hemoglobin 11.6 (L) 12.0 - 15.0 g/dL   HCT 16.1 09.6 - 04.5 %   MCV 80.2 78.0 - 100.0 fL   MCH 25.8 (L) 26.0 - 34.0 pg   MCHC 32.2 30.0 - 36.0 g/dL   RDW 40.9 81.1 - 91.4 %   Platelets 303 150 - 400 K/uL  Comprehensive metabolic panel     Status: Abnormal   Collection  Time: 02/04/15 11:57 AM  Result Value Ref Range   Sodium 139 135 - 145 mmol/L   Potassium 4.5 3.5 - 5.1 mmol/L   Chloride 104 101 - 111 mmol/L   CO2 29 22 - 32 mmol/L   Glucose, Bld 100 (H) 65 - 99 mg/dL   BUN 15 6 - 20 mg/dL   Creatinine, Ser 7.82 0.44 - 1.00 mg/dL   Calcium 9.2 8.9 - 95.6 mg/dL   Total Protein 7.8 6.5 - 8.1 g/dL   Albumin 3.6 3.5 - 5.0 g/dL   AST 23 15 - 41 U/L   ALT 19 14 - 54 U/L   Alkaline Phosphatase 67 38 - 126 U/L   Total Bilirubin 0.5 0.3 - 1.2 mg/dL   GFR calc non Af Amer >60 >60 mL/min   GFR calc Af Amer >60 >60 mL/min   Anion gap 6 5 - 15  Amylase     Status: Abnormal   Collection Time: 02/04/15 11:57 AM  Result Value Ref Range   Amylase 102 (H) 28 - 100 U/L  Lipase, blood     Status: None   Collection Time: 02/04/15 11:57 AM  Result Value Ref Range   Lipase 32 11 - 51 U/L    MDM UPT negative Patient initially received 1 year rx of sprintec in MAU last year. Will rx 2 months worth of sprintec until she can initiate care with PCP Assessment and Plan  A: 1. Gastroesophageal reflux disease without esophagitis   2. Screening for STD (sexually transmitted disease)   3. BV (bacterial vaginosis)   4. Encounter for other contraceptive management    P; Discharge home Rx flagyl, omeprazole, & sprintec Discussed diet appropriate for GERD & reasons to seek immediate treatment Start care with PCP of choice for routine care & contraception management GC/CT, RPR, HIV pending  Judeth Horn, NP  02/04/2015, 11:18 AM

## 2015-02-04 NOTE — Discharge Instructions (Signed)
Bacterial Vaginosis Bacterial vaginosis is a vaginal infection that occurs when the normal balance of bacteria in the vagina is disrupted. It results from an overgrowth of certain bacteria. This is the most common vaginal infection in women of childbearing age. Treatment is important to prevent complications, especially in pregnant women, as it can cause a premature delivery. CAUSES  Bacterial vaginosis is caused by an increase in harmful bacteria that are normally present in smaller amounts in the vagina. Several different kinds of bacteria can cause bacterial vaginosis. However, the reason that the condition develops is not fully understood. RISK FACTORS Certain activities or behaviors can put you at an increased risk of developing bacterial vaginosis, including:  Having a new sex partner or multiple sex partners.  Douching.  Using an intrauterine device (IUD) for contraception. Women do not get bacterial vaginosis from toilet seats, bedding, swimming pools, or contact with objects around them. SIGNS AND SYMPTOMS  Some women with bacterial vaginosis have no signs or symptoms. Common symptoms include:  Grey vaginal discharge.  A fishlike odor with discharge, especially after sexual intercourse.  Itching or burning of the vagina and vulva.  Burning or pain with urination. DIAGNOSIS  Your health care provider will take a medical history and examine the vagina for signs of bacterial vaginosis. A sample of vaginal fluid may be taken. Your health care provider will look at this sample under a microscope to check for bacteria and abnormal cells. A vaginal pH test may also be done.  TREATMENT  Bacterial vaginosis may be treated with antibiotic medicines. These may be given in the form of a pill or a vaginal cream. A second round of antibiotics may be prescribed if the condition comes back after treatment. Because bacterial vaginosis increases your risk for sexually transmitted diseases, getting  treated can help reduce your risk for chlamydia, gonorrhea, HIV, and herpes. HOME CARE INSTRUCTIONS   Only take over-the-counter or prescription medicines as directed by your health care provider.  If antibiotic medicine was prescribed, take it as directed. Make sure you finish it even if you start to feel better.  Tell all sexual partners that you have a vaginal infection. They should see their health care provider and be treated if they have problems, such as a mild rash or itching.  During treatment, it is important that you follow these instructions:  Avoid sexual activity or use condoms correctly.  Do not douche.  Avoid alcohol as directed by your health care provider.  Avoid breastfeeding as directed by your health care provider. SEEK MEDICAL CARE IF:   Your symptoms are not improving after 3 days of treatment.  You have increased discharge or pain.  You have a fever. MAKE SURE YOU:   Understand these instructions.  Will watch your condition.  Will get help right away if you are not doing well or get worse. FOR MORE INFORMATION  Centers for Disease Control and Prevention, Division of STD Prevention: SolutionApps.co.za American Sexual Health Association (ASHA): www.ashastd.org    This information is not intended to replace advice given to you by your health care provider. Make sure you discuss any questions you have with your health care provider.   Document Released: 01/31/2005 Document Revised: 02/21/2014 Document Reviewed: 09/12/2012 Elsevier Interactive Patient Education 2016 [employer]. Food Choices for Gastroesophageal Reflux Disease, Adult When you have gastroesophageal reflux disease (GERD), the foods you eat and your eating habits are very important. Choosing the right foods can help ease your discomfort.  WHAT GUIDELINES  DO I NEED TO FOLLOW?   Choose fruits, vegetables, whole grains, and low-fat dairy products.   Choose low-fat meat, fish, and  poultry.  Limit fats such as oils, salad dressings, butter, nuts, and avocado.   Keep a food diary. This helps you identify foods that cause symptoms.   Avoid foods that cause symptoms. These may be different for everyone.   Eat small meals often instead of 3 large meals a day.   Eat your meals slowly, in a place where you are relaxed.   Limit fried foods.   Cook foods using methods other than frying.   Avoid drinking alcohol.   Avoid drinking large amounts of liquids with your meals.   Avoid bending over or lying down until 2-3 hours after eating.  WHAT FOODS ARE NOT RECOMMENDED?  These are some foods and drinks that may make your symptoms worse: Vegetables Tomatoes. Tomato juice. Tomato and spaghetti sauce. Chili peppers. Onion and garlic. Horseradish. Fruits Oranges, grapefruit, and lemon (fruit and juice). Meats High-fat meats, fish, and poultry. This includes hot dogs, ribs, ham, sausage, salami, and bacon. Dairy Whole milk and chocolate milk. Sour cream. Cream. Butter. Ice cream. Cream cheese.  Drinks Coffee and tea. Bubbly (carbonated) drinks or energy drinks. Condiments Hot sauce. Barbecue sauce.  Sweets/Desserts Chocolate and cocoa. Donuts. Peppermint and spearmint. Fats and Oils High-fat foods. This includes [place] fries and potato chips. Other Vinegar. Strong spices. This includes black pepper, white pepper, red pepper, cayenne, curry powder, cloves, ginger, and chili powder. The items listed above may not be a complete list of foods and drinks to avoid. Contact your dietitian for more information.   This information is not intended to replace advice given to you by your health care provider. Make sure you discuss any questions you have with your health care provider.   Document Released: 08/02/2011 Document Revised: 02/21/2014 Document Reviewed: 12/05/2012 Elsevier Interactive Patient Education 2016 Elsevier Inc. Gastroesophageal Reflux Disease,  Adult Normally, food travels down the esophagus and stays in the stomach to be digested. If a person has gastroesophageal reflux disease (GERD), food and stomach acid move back up into the esophagus. When this happens, the esophagus becomes sore and swollen (inflamed). Over time, GERD can make small holes (ulcers) in the lining of the esophagus. HOME CARE Diet  Follow a diet as told by your doctor. You may need to avoid foods and drinks such as:  Coffee and tea (with or without caffeine).  Drinks that contain alcohol.  Energy drinks and sports drinks.  Carbonated drinks or sodas.  Chocolate and cocoa.  Peppermint and mint flavorings.  Garlic and onions.  Horseradish.  Spicy and acidic foods, such as peppers, chili powder, curry powder, vinegar, hot sauces, and BBQ sauce.  Citrus fruit juices and citrus fruits, such as oranges, lemons, and limes.  Tomato-based foods, such as red sauce, chili, salsa, and pizza with red sauce.  Fried and fatty foods, such as donuts, french fries, potato chips, and high-fat dressings.  High-fat meats, such as hot dogs, rib eye steak, sausage, ham, and bacon.  High-fat dairy items, such as whole milk, butter, and cream cheese.  Eat small meals often. Avoid eating large meals.  Avoid drinking large amounts of liquid with your meals.  Avoid eating meals during the 2-3 hours before bedtime.  Avoid lying down right after you eat.  Do not exercise right after you eat. General Instructions  Pay attention to any changes in your symptoms.  Take over-the-counter and  prescription medicines only as told by your doctor. Do not take aspirin, ibuprofen, or other NSAIDs unless your doctor says it is okay.  Do not use any tobacco products, including cigarettes, chewing tobacco, and e-cigarettes. If you need help quitting, ask your doctor.  Wear loose clothes. Do not wear anything tight around your waist.  Raise (elevate) the head of your bed about  6 inches (15 cm).  Try to lower your stress. If you need help doing this, ask your doctor.  If you are overweight, lose an amount of weight that is healthy for you. Ask your doctor about a safe weight loss goal.  Keep all follow-up visits as told by your doctor. This is important. GET HELP IF:  You have new symptoms.  You lose weight and you do not know why it is happening.  You have trouble swallowing, or it hurts to swallow.  You have wheezing or a cough that keeps happening.  Your symptoms do not get better with treatment.  You have a hoarse voice. GET HELP RIGHT AWAY IF:  You have pain in your arms, neck, jaw, teeth, or back.  You feel sweaty, dizzy, or light-headed.  You have chest pain or shortness of breath.  You throw up (vomit) and your throw up looks like blood or coffee grounds.  You pass out (faint).  Your poop (stool) is bloody or black.  You cannot swallow, drink, or eat.   This information is not intended to replace advice given to you by your health care provider. Make sure you discuss any questions you have with your health care provider.   Document Released: 07/20/2007 Document Revised: 10/22/2014 Document Reviewed: 05/28/2014 Elsevier Interactive Patient Education [employer]

## 2015-02-04 NOTE — MAU Note (Signed)
Been having really bad cramps in lower abd for the past wk, also some pain in RUQ.  Couple days ago noticed it smelled funny when she peed.

## 2015-02-05 LAB — GC/CHLAMYDIA PROBE AMP (~~LOC~~) NOT AT ARMC
Chlamydia: NEGATIVE
NEISSERIA GONORRHEA: NEGATIVE

## 2015-02-05 LAB — HIV ANTIBODY (ROUTINE TESTING W REFLEX): HIV Screen 4th Generation wRfx: NONREACTIVE

## 2015-02-05 LAB — RPR: RPR Ser Ql: NONREACTIVE

## 2015-02-27 ENCOUNTER — Emergency Department (HOSPITAL_COMMUNITY): Payer: No Typology Code available for payment source

## 2015-02-27 ENCOUNTER — Encounter (HOSPITAL_COMMUNITY): Payer: Self-pay | Admitting: Family Medicine

## 2015-02-27 ENCOUNTER — Emergency Department (HOSPITAL_COMMUNITY)
Admission: EM | Admit: 2015-02-27 | Discharge: 2015-02-27 | Disposition: A | Discharge status: Home / Self Care | Payer: Self-pay | Attending: Emergency Medicine | Admitting: Emergency Medicine

## 2015-02-27 DIAGNOSIS — Z79899 Other long term (current) drug therapy: Secondary | ICD-10-CM | POA: Insufficient documentation

## 2015-02-27 DIAGNOSIS — M545 Low back pain, unspecified: Secondary | ICD-10-CM

## 2015-02-27 DIAGNOSIS — Z791 Long term (current) use of non-steroidal anti-inflammatories (NSAID): Secondary | ICD-10-CM | POA: Insufficient documentation

## 2015-02-27 DIAGNOSIS — Z8619 Personal history of other infectious and parasitic diseases: Secondary | ICD-10-CM | POA: Insufficient documentation

## 2015-02-27 DIAGNOSIS — J45909 Unspecified asthma, uncomplicated: Secondary | ICD-10-CM | POA: Insufficient documentation

## 2015-02-27 DIAGNOSIS — Z3202 Encounter for pregnancy test, result negative: Secondary | ICD-10-CM | POA: Insufficient documentation

## 2015-02-27 DIAGNOSIS — Z792 Long term (current) use of antibiotics: Secondary | ICD-10-CM | POA: Insufficient documentation

## 2015-02-27 DIAGNOSIS — M533 Sacrococcygeal disorders, not elsewhere classified: Secondary | ICD-10-CM | POA: Insufficient documentation

## 2015-02-27 DIAGNOSIS — Z7982 Long term (current) use of aspirin: Secondary | ICD-10-CM | POA: Insufficient documentation

## 2015-02-27 DIAGNOSIS — Z862 Personal history of diseases of the blood and blood-forming organs and certain disorders involving the immune mechanism: Secondary | ICD-10-CM | POA: Insufficient documentation

## 2015-02-27 DIAGNOSIS — G43909 Migraine, unspecified, not intractable, without status migrainosus: Secondary | ICD-10-CM | POA: Insufficient documentation

## 2015-02-27 LAB — POC URINE PREG, ED: Preg Test, Ur: NEGATIVE

## 2015-02-27 MED ORDER — KETOROLAC TROMETHAMINE 30 MG/ML IJ SOLN
30.0000 mg | Freq: Once | INTRAMUSCULAR | Status: AC
  Administered 2015-02-27: 30 mg via INTRAVENOUS
  Filled 2015-02-27: qty 1

## 2015-02-27 MED ORDER — IBUPROFEN 800 MG PO TABS: 800.0000 mg | ORAL_TABLET | Freq: Three times a day (TID) | ORAL | Status: DC

## 2015-02-27 MED ORDER — ACETAMINOPHEN 500 MG PO TABS
1000.0000 mg | ORAL_TABLET | Freq: Once | ORAL | Status: AC
  Administered 2015-02-27: 1000 mg via ORAL
  Filled 2015-02-27: qty 2

## 2015-02-27 NOTE — Discharge Instructions (Signed)
Your x-rays were normal. You most likely have some bruising and inflammation of your soft tissues. You may take ibuprofen 800mg  three times a day as needed for pain. You may also take acetaminophen 650mg  every 6 hours. You may also use a heating pad or ice to the area to help alleviate your symptoms.

## 2015-02-27 NOTE — ED Provider Notes (Signed)
CSN: [id number]     Arrival date & time 02/27/15  1337 History  By signing my name below, I, Placido Sou, attest that this documentation has been prepared under the direction and in the presence of Dezmen Alcock Y Naia Ruff, [place]. Electronically Signed: Placido Sou, ED Scribe. 02/27/2015. 2:29 PM.   Chief Complaint  Patient presents with  . Tailbone Pain   The history is provided by the patient. No language interpreter was used.     HPI Comments: Lindsay Perez is a 25 y.o. female who presents to the Emergency Department complaining of constant, moderate, tailbone pain with onset 1 day ago. Pt notes having "intense" sexual intercourse with her symptoms beginning the next morning. She notes worsening pain with ambulation or when sitting. Pt notes some associated, mild, intermittent, right leg numbness. Pt notes taking pills from her friend for muscle relaxer and anti-inflammatory. She does not know the names of the medications.  Ep[facility] Pill ID reveals her pills are meloxicam and flexeril. She states it provided mild relief further noting "it helped me sleep". She denies dysuria, difficulty urinating, bowel/bladder incontinence, N/V/D, fever, chills or any other associated symptoms at this time.     Past Medical History  Diagnosis Date  . Seasonal allergies   . Headache(784.0)     migranes  . Anemia     as a child  . Asthma     as a child  . Trichomonas infection   . Chlamydia    Past Surgical History  Procedure Laterality Date  . No past surgeries     Family History  Problem Relation Age of Onset  . Drug abuse Mother   . Mental illness Brother   . Mental retardation Brother    Social History  Substance Use Topics  . Smoking status: Never Smoker   . Smokeless tobacco: Never Used  . Alcohol Use: 11.4 oz/week    5 Glasses of wine, 14 Shots of liquor per week     Comment: occasionally   OB History    Gravida Para Term Preterm AB TAB SAB Ectopic Multiple Living   1    1  1    0       Review of Systems  All other systems reviewed and are negative.   Allergies  Shellfish allergy  Home Medications   Prior to Admission medications   Medication Sig Start Date End Date Taking? Authorizing Provider  acetaminophen (TYLENOL) 500 MG tablet Take 1,000-1,500 mg by mouth every 6 (six) hours as needed for mild pain or headache (toothpain).     Historical Provider, MD  albuterol (PROVENTIL HFA;VENTOLIN HFA) 108 (90 BASE) MCG/ACT inhaler Inhale into the lungs every 6 (six) hours as needed for wheezing or shortness of breath.    Historical Provider, MD  aspirin-acetaminophen-caffeine (EXCEDRIN MIGRAINE) [phone] MG per tablet Take 3 tablets by mouth every 6 (six) hours as needed for headache.    Historical Provider, MD  ibuprofen (ADVIL,MOTRIN) 600 MG tablet Take 1 tablet (600 mg total) by mouth 3 (three) times daily. 02/28/14   Lisa A Leftwich-Kirby, CNM  metroNIDAZOLE (FLAGYL) 500 MG tablet Take 1 tablet (500 mg total) by mouth 2 (two) times daily. 02/04/15   Judeth Horn, NP  norgestimate-ethinyl estradiol (SPRINTEC 28) 0.25-35 MG-MCG tablet Take 1 tablet by mouth daily. 02/04/15   Judeth Horn, NP  omeprazole (PRILOSEC) 20 MG capsule Take 1 capsule (20 mg total) by mouth daily. 02/04/15   Judeth Horn, NP   BP 129/77 mmHg  Pulse  95  Temp(Src) 98 F (36.7 C)  Resp 16  SpO2 98%  LMP 01/29/2015    Physical Exam  Constitutional: She is oriented to person, place, and time. She appears well-developed and well-nourished.  Pt laying on side, states unable to lay on back due to pain  HENT:  Head: Normocephalic and atraumatic.  Mouth/Throat: No oropharyngeal exudate.  Neck: Normal range of motion. No tracheal deviation present.  Cardiovascular: Normal rate.   Pulmonary/Chest: Effort normal. No respiratory distress.  Abdominal: Soft. There is no tenderness.  Musculoskeletal: Normal range of motion. She exhibits no edema.  Low back and coccygeal region diffusely tender  to palpation  Neurological: She is alert and oriented to person, place, and time.  Skin: Skin is warm and dry.  Psychiatric: She has a normal mood and affect. Her behavior is normal.  Nursing note and vitals reviewed.   ED Course  Procedures  DIAGNOSTIC STUDIES: Oxygen Saturation is 98% on RA, normal by my interpretation.    COORDINATION OF CARE: 2:26 PM Pt presents today due to tailbone pain. Discussed next steps with pt including a Toradol injection, tylenol and DG's of the affected region with reevaluation based on results of the imaging. Pt understood and is agreeable to the plan.   Labs Review Labs Reviewed - No data to display  Imaging Review Dg Lumbar Spine Complete  02/27/2015  CLINICAL DATA:  Constant coccygeal pain 1 day. Intense sexual intercourse recently with symptoms beginning next morning. Pain worse with ambulation and sitting. Some associated intermittent right leg numbness. EXAM: LUMBAR SPINE - COMPLETE 4+ VIEW COMPARISON:  None. FINDINGS: Vertebral body alignment, heights and disc space heights are normal. No evidence of acute fracture or subluxation. IMPRESSION: Negative. Electronically Signed   By: Elberta Fortisaniel  Boyle M.D.   On: 02/27/2015 17:29   Dg Sacrum/coccyx  02/27/2015  CLINICAL DATA:  Acute tailbone pain for 1 day. EXAM: SACRUM AND COCCYX - 2+ VIEW COMPARISON:  None. FINDINGS: There is no evidence of fracture or other focal bone lesions. IMPRESSION: Normal sacrum and coccyx. Electronically Signed   By: Lupita RaiderJames  Green Jr, M.D.   On: 02/27/2015 17:27   I have personally reviewed and evaluated these images as part of my medical decision-making.   EKG Interpretation None      MDM   Final diagnoses:  Coccydynia  Bilateral low back pain without sciatica    Will give toradol and tylenol here. Will obtain lumbar spine and sacrum/coccyx X rays.  XR negative. Will d/c home with rx for ibuprofen and discussed symptomatic treatment. Encouraged pt to avoid medications  given by her friends. Work note given. ER return precautions given.   I personally performed the services described in this documentation, which was scribed in my presence. The recorded information has been reviewed and is accurate.   Carlene CoriaSerena Y Taiwan Millon, PA-C 02/27/15 1758  Loren Raceravid Yelverton, MD 03/02/15 [phone]

## 2015-02-27 NOTE — ED Notes (Signed)
Pt here for tailbone pain after sex.

## 2015-03-25 ENCOUNTER — Encounter (HOSPITAL_COMMUNITY): Payer: Self-pay | Admitting: *Deleted

## 2015-03-25 ENCOUNTER — Inpatient Hospital Stay (HOSPITAL_COMMUNITY)
Admission: AD | Admit: 2015-03-25 | Discharge: 2015-03-25 | Disposition: A | Discharge status: Home / Self Care | Payer: BLUE CROSS/BLUE SHIELD | Source: Ambulatory Visit | Attending: Obstetrics & Gynecology | Admitting: Obstetrics & Gynecology

## 2015-03-25 DIAGNOSIS — G43909 Migraine, unspecified, not intractable, without status migrainosus: Secondary | ICD-10-CM | POA: Insufficient documentation

## 2015-03-25 DIAGNOSIS — B3731 Acute candidiasis of vulva and vagina: Secondary | ICD-10-CM

## 2015-03-25 DIAGNOSIS — N898 Other specified noninflammatory disorders of vagina: Secondary | ICD-10-CM | POA: Diagnosis present

## 2015-03-25 DIAGNOSIS — B373 Candidiasis of vulva and vagina: Secondary | ICD-10-CM | POA: Diagnosis not present

## 2015-03-25 DIAGNOSIS — J45909 Unspecified asthma, uncomplicated: Secondary | ICD-10-CM | POA: Diagnosis not present

## 2015-03-25 LAB — URINE MICROSCOPIC-ADD ON

## 2015-03-25 LAB — URINALYSIS, ROUTINE W REFLEX MICROSCOPIC
Bilirubin Urine: NEGATIVE
GLUCOSE, UA: NEGATIVE mg/dL
Ketones, ur: NEGATIVE mg/dL
Nitrite: NEGATIVE
PH: 7 (ref 5.0–8.0)
PROTEIN: NEGATIVE mg/dL
Specific Gravity, Urine: 1.015 (ref 1.005–1.030)

## 2015-03-25 LAB — WET PREP, GENITAL
CLUE CELLS WET PREP: NONE SEEN
SPERM: NONE SEEN
TRICH WET PREP: NONE SEEN

## 2015-03-25 LAB — POCT PREGNANCY, URINE: Preg Test, Ur: NEGATIVE

## 2015-03-25 MED ORDER — TERCONAZOLE 0.4 % VA CREA: 1.0000 | TOPICAL_CREAM | Freq: Every day | VAGINAL | Status: DC

## 2015-03-25 NOTE — Discharge Instructions (Signed)

## 2015-03-25 NOTE — MAU Provider Note (Signed)
History     CSN: [id number]  Arrival date and time: 03/25/15 1001   First Provider Initiated Contact with Patient 03/25/15 1048      Chief Complaint  Patient presents with  . Vaginal Pain   HPI Lindsay Perez 25 y.o. [id number]  presents to MAU for burning.  She noted it 3 days ago but it worsened today.  She denies abdominal pain, fever, vaginal bleeding, new back pain.   OB History    Gravida Para Term Preterm AB TAB SAB Ectopic Multiple Living   0      Past Medical History  Diagnosis Date  . Seasonal allergies   . Headache(784.0)     migranes  . Anemia     as a child  . Asthma     as a child  . Trichomonas infection   . Chlamydia     Past Surgical History  Procedure Laterality Date  . No past surgeries      Family History  Problem Relation Age of Onset  . Drug abuse Mother   . Mental illness Brother   . Mental retardation Brother     Social History  Substance Use Topics  . Smoking status: Never Smoker   . Smokeless tobacco: Never Used  . Alcohol Use: 11.4 oz/week    5 Glasses of wine, 14 Shots of liquor per week     Comment: occasionally    Allergies:  Allergies  Allergen Reactions  . Shellfish Allergy Swelling    Prescriptions prior to admission  Medication Sig Dispense Refill Last Dose  . acetaminophen (TYLENOL) 500 MG tablet Take 1,000-1,500 mg by mouth every 6 (six) hours as needed for mild pain or headache (toothpain).    Past Week at Unknown time  . albuterol (PROVENTIL HFA;VENTOLIN HFA) 108 (90 BASE) MCG/ACT inhaler Inhale into the lungs every 6 (six) hours as needed for wheezing or shortness of breath.   rescue  . aspirin-acetaminophen-caffeine (EXCEDRIN MIGRAINE) 250-250-65 MG per tablet Take 3 tablets by mouth every 6 (six) hours as needed for headache.   Past Month at Unknown time  . ibuprofen (ADVIL,MOTRIN) 600 MG tablet Take 1 tablet (600 mg total) by mouth 3 (three) times daily. 30 tablet 1 02/03/2015 at Unknown  time  . ibuprofen (ADVIL,MOTRIN) 800 MG tablet Take 1 tablet (800 mg total) by mouth 3 (three) times daily. 21 tablet 0   . metroNIDAZOLE (FLAGYL) 500 MG tablet Take 1 tablet (500 mg total) by mouth 2 (two) times daily. 14 tablet 0   . norgestimate-ethinyl estradiol (SPRINTEC 28) 0.25-35 MG-MCG tablet Take 1 tablet by mouth daily. 1 Package 1   . omeprazole (PRILOSEC) 20 MG capsule Take 1 capsule (20 mg total) by mouth daily. 30 capsule 1     ROS Pertinent ROS in HPI.  All other systems are negative.   Physical Exam   Blood pressure 129/71, pulse 82, temperature 98.5 F (36.9 C), temperature source Oral, resp. rate 18, height  (1.575 m), weight 208 lb 12.8 oz (94.711 kg), last menstrual period 03/02/2015.  Physical Exam  Constitutional: She is oriented to person, place, and time. She appears well-developed and well-nourished. No distress.  HENT:  Head: Normocephalic and atraumatic.  Eyes: Conjunctivae and EOM are normal.  Neck: Normal range of motion. Neck supple.  Cardiovascular: Normal rate and normal heart sounds.   Respiratory: Effort normal. No respiratory distress.  GI: Soft. She exhibits no distension. There is  no tenderness. There is no rebound and no guarding.  Genitourinary:  Erythema of vaginal walls.  Discharge is thick, homogenous.  No CMT.  NO adnexal mass or tenderness.   Musculoskeletal: Normal range of motion.  Neurological: She is alert and oriented to person, place, and time.  Skin: Skin is warm and dry.  Psychiatric: She has a normal mood and affect. Her behavior is normal.   Results for orders placed or performed during the hospital encounter of 03/25/15 (from the past 48 hour(s))  Urinalysis, Routine w reflex microscopic (not at [facility])     Status: Abnormal   Collection Time: 03/25/15 10:20 AM  Result Value Ref Range   Color, Urine YELLOW YELLOW   APPearance CLEAR CLEAR   Specific Gravity, Urine 1.015 1.005 - 1.030   pH 7.0 5.0 - 8.0   Glucose, UA  NEGATIVE NEGATIVE mg/dL   Hgb urine dipstick TRACE (A) NEGATIVE   Bilirubin Urine NEGATIVE NEGATIVE   Ketones, ur NEGATIVE NEGATIVE mg/dL   Protein, ur NEGATIVE NEGATIVE mg/dL   Nitrite NEGATIVE NEGATIVE   Leukocytes, UA MODERATE (A) NEGATIVE  Urine microscopic-add on     Status: Abnormal   Collection Time: 03/25/15 10:20 AM  Result Value Ref Range   Squamous Epithelial / LPF 0-5 (A) NONE SEEN   WBC, UA 0-5 0 - 5 WBC/hpf   RBC / HPF 0-5 0 - 5 RBC/hpf   Bacteria, UA RARE (A) NONE SEEN  Pregnancy, urine POC     Status: None   Collection Time: 03/25/15 10:23 AM  Result Value Ref Range   Preg Test, Ur NEGATIVE NEGATIVE    Comment:        THE SENSITIVITY OF THIS METHODOLOGY IS >24 mIU/mL   Wet prep, genital     Status: Abnormal   Collection Time: 03/25/15 11:00 AM  Result Value Ref Range   Yeast Wet Prep HPF POC PRESENT (A) NONE SEEN   Trich, Wet Prep NONE SEEN NONE SEEN   Clue Cells Wet Prep HPF POC NONE SEEN NONE SEEN   WBC, Wet Prep HPF POC MANY (A) NONE SEEN    Comment: MODERATE BACTERIA SEEN   Sperm NONE SEEN     MAU Course  Procedures  MDM Labs consistent with yeast.   Assessment and Plan  A: vulvovaginal candidiasis  P: Discharge to home Terazole cream x 1 week F/u with GYN Patient may return to MAU as needed or if her condition were to change or worsen   Bertram Denver 03/25/2015, 10:49 AM

## 2015-03-25 NOTE — MAU Note (Signed)
Past 3 days has had itching and burning.  Today when she went to pee, it really hurt from the burning.

## 2015-03-26 LAB — GC/CHLAMYDIA PROBE AMP (~~LOC~~) NOT AT ARMC
Chlamydia: NEGATIVE
Neisseria Gonorrhea: NEGATIVE

## 2015-04-06 ENCOUNTER — Encounter (HOSPITAL_COMMUNITY): Payer: Self-pay

## 2015-04-06 ENCOUNTER — Emergency Department (HOSPITAL_COMMUNITY)
Admission: EM | Admit: 2015-04-06 | Discharge: 2015-04-06 | Disposition: A | Discharge status: Home / Self Care | Payer: BLUE CROSS/BLUE SHIELD | Attending: Emergency Medicine | Admitting: Emergency Medicine

## 2015-04-06 DIAGNOSIS — Z862 Personal history of diseases of the blood and blood-forming organs and certain disorders involving the immune mechanism: Secondary | ICD-10-CM | POA: Insufficient documentation

## 2015-04-06 DIAGNOSIS — J029 Acute pharyngitis, unspecified: Secondary | ICD-10-CM | POA: Insufficient documentation

## 2015-04-06 DIAGNOSIS — R630 Anorexia: Secondary | ICD-10-CM | POA: Insufficient documentation

## 2015-04-06 DIAGNOSIS — Z8679 Personal history of other diseases of the circulatory system: Secondary | ICD-10-CM | POA: Insufficient documentation

## 2015-04-06 DIAGNOSIS — Z8619 Personal history of other infectious and parasitic diseases: Secondary | ICD-10-CM | POA: Insufficient documentation

## 2015-04-06 DIAGNOSIS — Z793 Long term (current) use of hormonal contraceptives: Secondary | ICD-10-CM | POA: Diagnosis not present

## 2015-04-06 DIAGNOSIS — R197 Diarrhea, unspecified: Secondary | ICD-10-CM | POA: Insufficient documentation

## 2015-04-06 DIAGNOSIS — R1013 Epigastric pain: Secondary | ICD-10-CM | POA: Insufficient documentation

## 2015-04-06 DIAGNOSIS — Z791 Long term (current) use of non-steroidal anti-inflammatories (NSAID): Secondary | ICD-10-CM | POA: Diagnosis not present

## 2015-04-06 DIAGNOSIS — R112 Nausea with vomiting, unspecified: Secondary | ICD-10-CM | POA: Diagnosis not present

## 2015-04-06 DIAGNOSIS — Z3202 Encounter for pregnancy test, result negative: Secondary | ICD-10-CM | POA: Insufficient documentation

## 2015-04-06 DIAGNOSIS — J45909 Unspecified asthma, uncomplicated: Secondary | ICD-10-CM | POA: Diagnosis not present

## 2015-04-06 DIAGNOSIS — R51 Headache: Secondary | ICD-10-CM | POA: Diagnosis not present

## 2015-04-06 LAB — URINALYSIS, ROUTINE W REFLEX MICROSCOPIC
Bilirubin Urine: NEGATIVE
GLUCOSE, UA: NEGATIVE mg/dL
Ketones, ur: NEGATIVE mg/dL
Leukocytes, UA: NEGATIVE
Nitrite: NEGATIVE
PH: 6 (ref 5.0–8.0)
PROTEIN: NEGATIVE mg/dL
Specific Gravity, Urine: 1.034 — ABNORMAL HIGH (ref 1.005–1.030)

## 2015-04-06 LAB — CBC
HCT: 37.7 % (ref 36.0–46.0)
Hemoglobin: 12 g/dL (ref 12.0–15.0)
MCH: 25.5 pg — AB (ref 26.0–34.0)
MCHC: 31.8 g/dL (ref 30.0–36.0)
MCV: 80.2 fL (ref 78.0–100.0)
PLATELETS: 293 10*3/uL (ref 150–400)
RBC: 4.7 MIL/uL (ref 3.87–5.11)
RDW: 14.3 % (ref 11.5–15.5)
WBC: 6.2 10*3/uL (ref 4.0–10.5)

## 2015-04-06 LAB — URINE MICROSCOPIC-ADD ON

## 2015-04-06 LAB — COMPREHENSIVE METABOLIC PANEL
ALBUMIN: 3.5 g/dL (ref 3.5–5.0)
ALT: 24 U/L (ref 14–54)
AST: 27 U/L (ref 15–41)
Alkaline Phosphatase: 58 U/L (ref 38–126)
Anion gap: 12 (ref 5–15)
BUN: 10 mg/dL (ref 6–20)
CHLORIDE: 103 mmol/L (ref 101–111)
CO2: 23 mmol/L (ref 22–32)
CREATININE: 0.63 mg/dL (ref 0.44–1.00)
Calcium: 9.1 mg/dL (ref 8.9–10.3)
GFR calc Af Amer: >60 mL/min (ref >60)
GFR calc non Af Amer: >60 mL/min (ref >60)
Glucose, Bld: 101 mg/dL — ABNORMAL HIGH (ref 65–99)
Potassium: 3.8 mmol/L (ref 3.5–5.1)
SODIUM: 138 mmol/L (ref 135–145)
Total Bilirubin: 0.6 mg/dL (ref 0.3–1.2)
Total Protein: 7 g/dL (ref 6.5–8.1)

## 2015-04-06 LAB — POC URINE PREG, ED: Preg Test, Ur: NEGATIVE

## 2015-04-06 LAB — LIPASE, BLOOD: LIPASE: 21 U/L (ref 11–51)

## 2015-04-06 MED ORDER — ONDANSETRON HCL 4 MG/2ML IJ SOLN
4.0000 mg | Freq: Once | INTRAMUSCULAR | Status: DC
  Filled 2015-04-06: qty 2

## 2015-04-06 MED ORDER — ACETAMINOPHEN 325 MG PO TABS
650.0000 mg | ORAL_TABLET | Freq: Once | ORAL | Status: AC
  Administered 2015-04-06: 650 mg via ORAL
  Filled 2015-04-06: qty 2

## 2015-04-06 MED ORDER — ONDANSETRON 4 MG PO TBDP
8.0000 mg | ORAL_TABLET | Freq: Once | ORAL | Status: AC
  Administered 2015-04-06: 8 mg via ORAL
  Filled 2015-04-06: qty 2

## 2015-04-06 MED ORDER — ONDANSETRON 8 MG PO TBDP: 8.0000 mg | ORAL_TABLET | Freq: Three times a day (TID) | ORAL | Status: DC | PRN

## 2015-04-06 MED ORDER — GI COCKTAIL ~~LOC~~
30.0000 mL | Freq: Once | ORAL | Status: AC
  Administered 2015-04-06: 30 mL via ORAL
  Filled 2015-04-06: qty 30

## 2015-04-06 MED ORDER — SODIUM CHLORIDE 0.9 % IV BOLUS (SEPSIS): 1000.0000 mL | Freq: Once | INTRAVENOUS | Status: DC

## 2015-04-06 NOTE — ED Notes (Signed)
Pt here for evaluation of vomiting diarrhea X 10 yesterday. Only had two episodes today.

## 2015-04-06 NOTE — ED Notes (Signed)
Unable to obtain IV access X 2.  

## 2015-04-06 NOTE — ED Notes (Signed)
patient here with vomiting and diarrhea x 36 hours, cramping with same

## 2015-04-06 NOTE — Discharge Instructions (Signed)
Take zofran as prescribed as needed for nausea. Take tylenol or motrin for headache. Drink plenty of fluids. See printout below on diet. Return if worsening.   Food Choices to Help Relieve Diarrhea, Adult When you have diarrhea, the foods you eat and your eating habits are very important. Choosing the right foods and drinks can help relieve diarrhea. Also, because diarrhea can last up to 7 days, you need to replace lost fluids and electrolytes (such as sodium, potassium, and chloride) in order to help prevent dehydration.  WHAT GENERAL GUIDELINES DO I NEED TO FOLLOW?  Slowly drink 1 cup (8 oz) of fluid for each episode of diarrhea. If you are getting enough fluid, your urine will be clear or pale yellow.  Eat starchy foods. Some good choices include white rice, white toast, pasta, low-fiber cereal, baked potatoes (without the skin), saltine crackers, and bagels.  Avoid large servings of any cooked vegetables.  Limit fruit to two servings per day. A serving is  cup or 1 small piece.  Choose foods with less than 2 g of fiber per serving.  Limit fats to less than 8 tsp (38 g) per day.  Avoid fried foods.  Eat foods that have probiotics in them. Probiotics can be found in certain dairy products.  Avoid foods and beverages that may increase the speed at which food moves through the stomach and intestines (gastrointestinal tract). Things to avoid include:  High-fiber foods, such as dried fruit, raw fruits and vegetables, nuts, seeds, and whole grain foods.  Spicy foods and high-fat foods.  Foods and beverages sweetened with high-fructose corn syrup, honey, or sugar alcohols such as xylitol, sorbitol, and mannitol. WHAT FOODS ARE RECOMMENDED? Grains White rice. White, [place], or pita breads (fresh or toasted), including plain rolls, buns, or bagels. White pasta. Saltine, soda, or graham crackers. Pretzels. Low-fiber cereal. Cooked cereals made with water (such as cornmeal, farina, or cream  cereals). Plain muffins. Matzo. Melba toast. Zwieback.  Vegetables Potatoes (without the skin). Strained tomato and vegetable juices. Most well-cooked and canned vegetables without seeds. Tender lettuce. Fruits Cooked or canned applesauce, apricots, cherries, fruit cocktail, grapefruit, peaches, pears, or plums. Fresh bananas, apples without skin, cherries, grapes, cantaloupe, grapefruit, peaches, oranges, or plums.  Meat and Other Protein Products Baked or boiled chicken. Eggs. Tofu. Fish. Seafood. Smooth peanut butter. Ground or well-cooked tender beef, ham, veal, lamb, pork, or poultry.  Dairy Plain yogurt, kefir, and unsweetened liquid yogurt. Lactose-free milk, buttermilk, or soy milk. Plain hard cheese. Beverages Sport drinks. Clear broths. Diluted fruit juices (except prune). Regular, caffeine-free sodas such as ginger ale. Water. Decaffeinated teas. Oral rehydration solutions. Sugar-free beverages not sweetened with sugar alcohols. Other Bouillon, broth, or soups made from recommended foods.  The items listed above may not be a complete list of recommended foods or beverages. Contact your dietitian for more options. WHAT FOODS ARE NOT RECOMMENDED? Grains Whole grain, whole wheat, bran, or rye breads, rolls, pastas, crackers, and cereals. Wild or brown rice. Cereals that contain more than 2 g of fiber per serving. Corn tortillas or taco shells. Cooked or dry oatmeal. Granola. Popcorn. Vegetables Raw vegetables. Cabbage, broccoli, Brussels sprouts, artichokes, baked beans, beet greens, corn, kale, legumes, peas, sweet potatoes, and yams. Potato skins. Cooked spinach and cabbage. Fruits Dried fruit, including raisins and dates. Raw fruits. Stewed or dried prunes. Fresh apples with skin, apricots, mangoes, pears, raspberries, and strawberries.  Meat and Other Protein Products Chunky peanut butter. Nuts and seeds. Beans and lentils. Tomasa Blase.  Dairy High-fat cheeses. Milk, chocolate milk, and  beverages made with milk, such as milk shakes. Cream. Ice cream. Sweets and Desserts Sweet rolls, doughnuts, and sweet breads. Pancakes and waffles. Fats and Oils Butter. Cream sauces. Margarine. Salad oils. Plain salad dressings. Olives. Avocados.  Beverages Caffeinated beverages (such as coffee, tea, soda, or energy drinks). Alcoholic beverages. Fruit juices with pulp. Prune juice. Soft drinks sweetened with high-fructose corn syrup or sugar alcohols. Other Coconut. Hot sauce. Chili powder. Mayonnaise. Gravy. Cream-based or milk-based soups.  The items listed above may not be a complete list of foods and beverages to avoid. Contact your dietitian for more information. WHAT SHOULD I DO IF I BECOME DEHYDRATED? Diarrhea can sometimes lead to dehydration. Signs of dehydration include dark urine and dry mouth and skin. If you think you are dehydrated, you should rehydrate with an oral rehydration solution. These solutions can be purchased at pharmacies, retail stores, or online.  Drink -1 cup (120-240 mL) of oral rehydration solution each time you have an episode of diarrhea. If drinking this amount makes your diarrhea worse, try drinking smaller amounts more often. For example, drink 1-3 tsp (5-15 mL) every 5-10 minutes.  A general rule for staying hydrated is to drink 1-2 L of fluid per day. Talk to your health care provider about the specific amount you should be drinking each day. Drink enough fluids to keep your urine clear or pale yellow.   This information is not intended to replace advice given to you by your health care provider. Make sure you discuss any questions you have with your health care provider.   Document Released: 04/23/2003 Document Revised: 02/21/2014 Document Reviewed: 12/24/2012 Elsevier Interactive Patient Education [employer].

## 2015-04-06 NOTE — ED Provider Notes (Signed)
CSN: [id number]     Arrival date & time 04/06/15  4098 History   First MD Initiated Contact with Patient 04/06/15 1229     Chief Complaint  Patient presents with  . Emesis  . Diarrhea     (Consider location/radiation/quality/duration/timing/severity/associated sxs/prior Treatment) HPI Lindsay Perez is a 25 y.o. female with hx of anemia, asthma, presents to ED with complaint of nausea, vomiting, diarrhea since yesterday. She also reports abdominal pain, that is mainly in epigastric area. She states she had greater than 10 episodes of watery diarrhea and clear emesis. Denies blood in stool or emesis. No recent travel, surgeries, antibiotics. Denies fever or chills. States she is having a headache and sore throat. She says she tried to eat and drink in general yesterday but ended up throwing it up. She did not try to take anything by mouth today. She states she does feel hungry. No medications prior to coming in. No hx of abdominal problems or surgeries  Past Medical History  Diagnosis Date  . Seasonal allergies   . Headache(784.0)     migranes  . Anemia     as a child  . Asthma     as a child  . Trichomonas infection   . Chlamydia    Past Surgical History  Procedure Laterality Date  . No past surgeries     Family History  Problem Relation Age of Onset  . Drug abuse Mother   . Mental illness Brother   . Mental retardation Brother    Social History  Substance Use Topics  . Smoking status: Never Smoker   . Smokeless tobacco: Never Used  . Alcohol Use: 11.4 oz/week    5 Glasses of wine, 14 Shots of liquor per week     Comment: occasionally   OB History    Gravida Para Term Preterm AB TAB SAB Ectopic Multiple Living   0     Review of Systems  Constitutional: Negative for fever and chills.  HENT: Positive for sore throat. Negative for congestion and trouble swallowing.   Respiratory: Negative for cough, chest tightness and shortness of breath.    Cardiovascular: Negative for chest pain, palpitations and leg swelling.  Gastrointestinal: Positive for nausea, vomiting, abdominal pain and diarrhea.  Genitourinary: Negative for dysuria, flank pain, vaginal bleeding, vaginal discharge, vaginal pain and pelvic pain.  Musculoskeletal: Negative for myalgias, arthralgias, neck pain and neck stiffness.  Skin: Negative for rash.  Neurological: Positive for headaches. Negative for dizziness and weakness.  All other systems reviewed and are negative.     Allergies  Shellfish allergy  Home Medications   Prior to Admission medications   Medication Sig Start Date End Date Taking? Authorizing Provider  Aspirin-Caffeine (BC FAST PAIN RELIEF PO) Take 1 packet by mouth daily as needed. For headache pain   Yes Historical Provider, MD  ibuprofen (ADVIL,MOTRIN) 600 MG tablet Take 1 tablet (600 mg total) by mouth 3 (three) times daily. 02/28/14  Yes Lisa A Leftwich-Kirby, CNM  norgestimate-ethinyl estradiol (SPRINTEC 28) 0.25-35 MG-MCG tablet Take 1 tablet by mouth daily. 02/04/15  Yes Judeth Horn, NP  albuterol (PROVENTIL HFA;VENTOLIN HFA) 108 (90 BASE) MCG/ACT inhaler Inhale into the lungs every 6 (six) hours as needed for wheezing or shortness of breath.    Historical Provider, MD  terconazole (TERAZOL 7) 0.4 % vaginal cream Place 1 applicator vaginally at bedtime. Apply to external surface as well. Patient not taking: Reported on 04/06/2015  03/25/15   Scot Jun Teague Clark, PA-C   BP 111/73 mmHg  Pulse 63  Temp(Src) 98.9 F (37.2 C) (Oral)  Resp 18  SpO2 100%  LMP 04/05/2015 Physical Exam  Constitutional: She is oriented to person, place, and time. She appears well-developed and well-nourished. No distress.  HENT:  Head: Normocephalic.  Eyes: Conjunctivae are normal.  Neck: Neck supple.  Cardiovascular: Normal rate, regular rhythm and normal heart sounds.   Pulmonary/Chest: Effort normal and breath sounds normal. No respiratory distress. She  has no wheezes. She has no rales.  Abdominal: Soft. Bowel sounds are normal. She exhibits no distension. There is tenderness. There is no rebound.  Epigastric tenderness  Musculoskeletal: She exhibits no edema.  Neurological: She is alert and oriented to person, place, and time.  Skin: Skin is warm and dry.  Psychiatric: She has a normal mood and affect. Her behavior is normal.  Nursing note and vitals reviewed.   ED Course  Procedures (including critical care time) Labs Review Labs Reviewed  COMPREHENSIVE METABOLIC PANEL - Abnormal; Notable for the following:    Glucose, Bld 101 (*)    All other components within normal limits  CBC - Abnormal; Notable for the following:    MCH 25.5 (*)    All other components within normal limits  URINALYSIS, ROUTINE W REFLEX MICROSCOPIC (NOT AT [facility]) - Abnormal; Notable for the following:    Specific Gravity, Urine 1.034 (*)    Hgb urine dipstick TRACE (*)    All other components within normal limits  URINE MICROSCOPIC-ADD ON - Abnormal; Notable for the following:    Squamous Epithelial / LPF 6-30 (*)    Bacteria, UA FEW (*)    All other components within normal limits  LIPASE, BLOOD  POC URINE PREG, ED    Imaging Review No results found. I have personally reviewed and evaluated these images and lab results as part of my medical decision-making.   EKG Interpretation None      MDM   Final diagnoses:  Nausea vomiting and diarrhea    patient with epigastric pain, nausea, vomiting, diarrhea, greater than 10 episodes since yesterday. She appears to be mildly dehydrated but is in no acute distress. Vital signs are normal. Will place an order for IV and labs. Patient states she has not vomited since yesterday. We will give Zofran.  Unable to place IV, patient was given 8 mg of ODT Zofran, GI cocktail. She is able to drink water without throwing up. I do not think patient needs any further imaging at this time. Patient stable for discharge  home with anti-emetics, at home oral hydration, follow-up as needed.  Filed Vitals:   04/06/15 1028 04/06/15 1300 04/06/15 1500  BP: 126/85 111/73   Pulse: 88 79 63  Temp: 98.9 F (37.2 C)    TempSrc: Oral    Resp: 18    SpO2: 97% 99% 100%       Jaynie Crumble, PA-C 04/06/15 1752  Margarita Grizzle, MD 04/06/15 1901

## 2015-11-16 IMAGING — CR DG CHEST 2V
2 series · 2 of 2 positions shown · non-contrast
Comparison: None.

CLINICAL DATA: Rt lower lateral chest pain, Some sob, and pain with
deep breath

EXAM:
CHEST  2 VIEW

[chest pa]
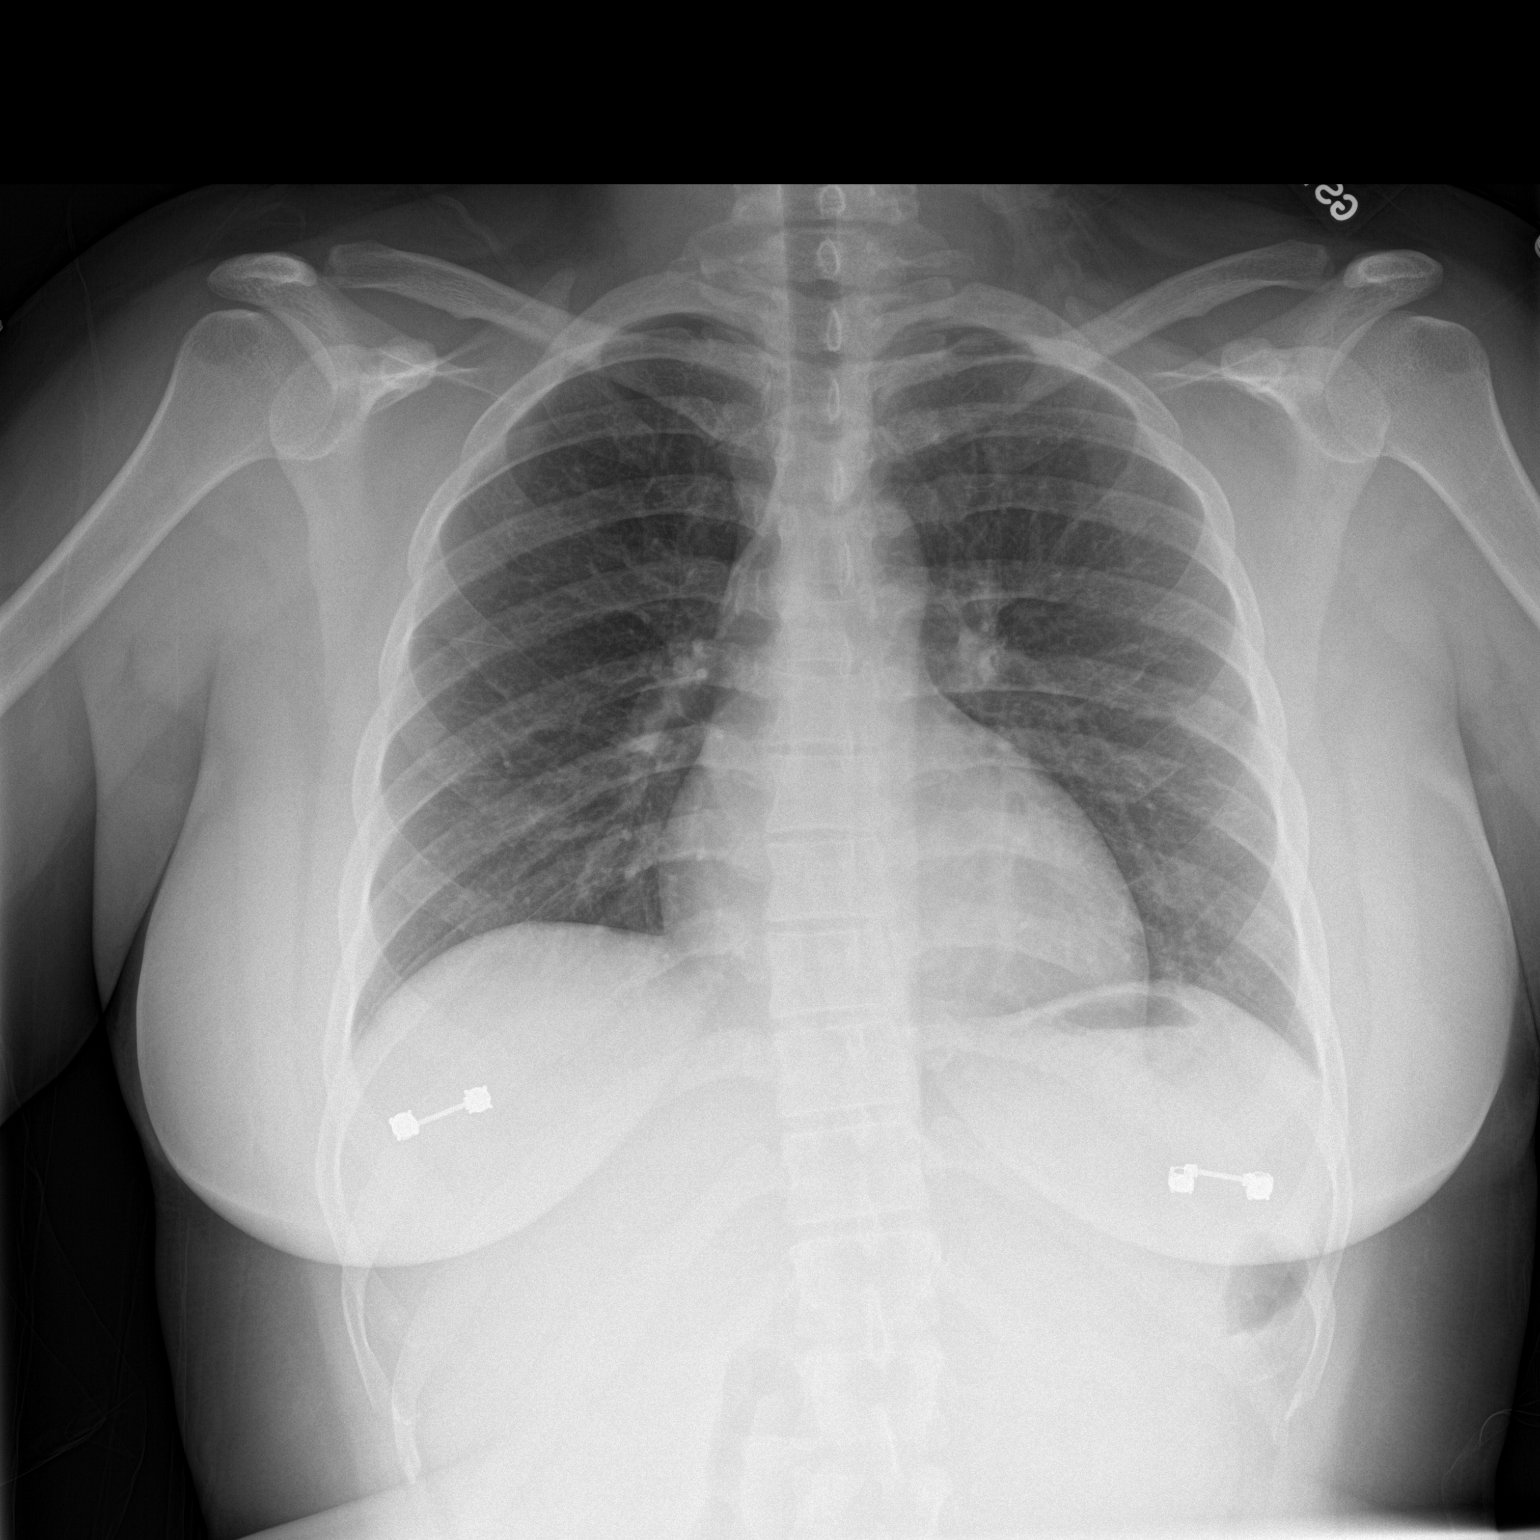

[chest lat]
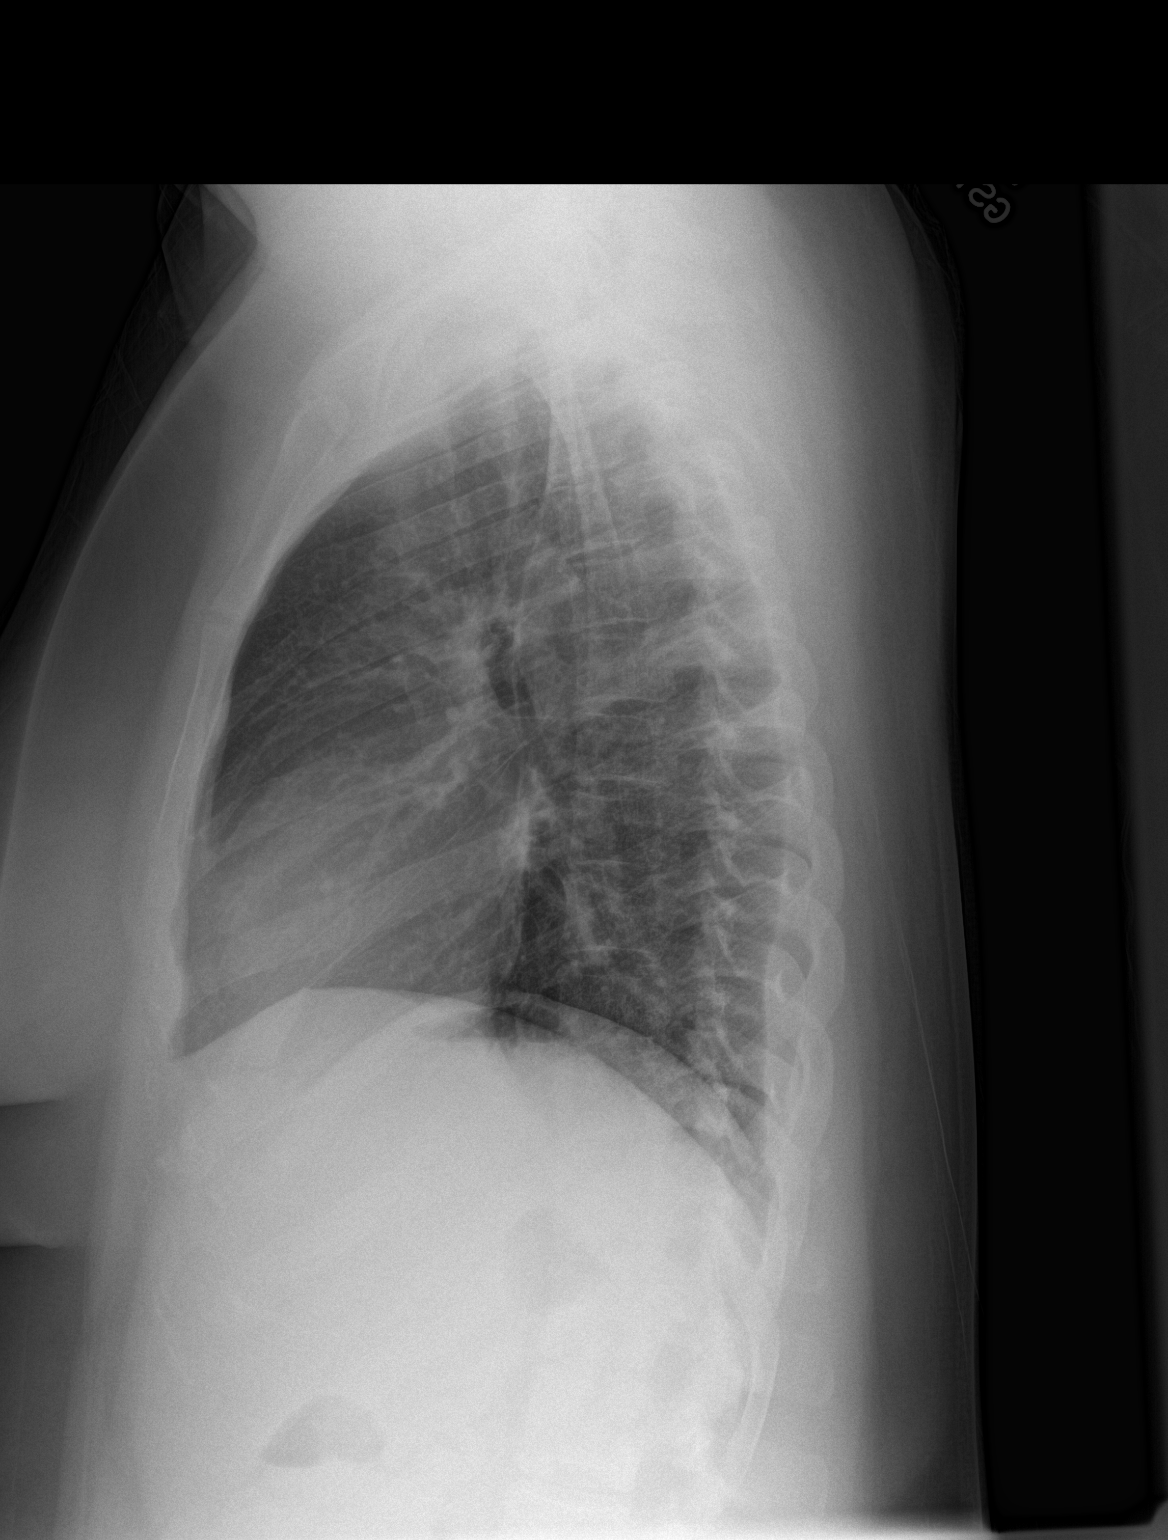

[2 of 2 positions shown; findings below may reference images not displayed]

FINDINGS: Normal heart, mediastinum and hila. Clear lungs. No pleural effusion
or pneumothorax. Bony thorax is unremarkable.
IMPRESSION: Normal chest radiographs.

## 2015-12-21 ENCOUNTER — Encounter (HOSPITAL_COMMUNITY): Payer: Self-pay | Admitting: *Deleted

## 2015-12-21 ENCOUNTER — Inpatient Hospital Stay (HOSPITAL_COMMUNITY)
Admission: AD | Admit: 2015-12-21 | Discharge: 2015-12-21 | Disposition: A | Discharge status: Home / Self Care | Payer: No Typology Code available for payment source | Source: Ambulatory Visit | Attending: Family Medicine | Admitting: Family Medicine

## 2015-12-21 DIAGNOSIS — B9689 Other specified bacterial agents as the cause of diseases classified elsewhere: Secondary | ICD-10-CM | POA: Insufficient documentation

## 2015-12-21 DIAGNOSIS — R1013 Epigastric pain: Secondary | ICD-10-CM | POA: Insufficient documentation

## 2015-12-21 DIAGNOSIS — F17200 Nicotine dependence, unspecified, uncomplicated: Secondary | ICD-10-CM | POA: Insufficient documentation

## 2015-12-21 DIAGNOSIS — Z91013 Allergy to seafood: Secondary | ICD-10-CM | POA: Insufficient documentation

## 2015-12-21 DIAGNOSIS — N898 Other specified noninflammatory disorders of vagina: Secondary | ICD-10-CM

## 2015-12-21 DIAGNOSIS — N760 Acute vaginitis: Secondary | ICD-10-CM | POA: Insufficient documentation

## 2015-12-21 LAB — URINALYSIS, ROUTINE W REFLEX MICROSCOPIC
Bilirubin Urine: NEGATIVE
GLUCOSE, UA: NEGATIVE mg/dL
Hgb urine dipstick: NEGATIVE
Ketones, ur: NEGATIVE mg/dL
LEUKOCYTES UA: NEGATIVE
Nitrite: NEGATIVE
PH: 6 (ref 5.0–8.0)
Protein, ur: NEGATIVE mg/dL
SPECIFIC GRAVITY, URINE: 1.025 (ref 1.005–1.030)

## 2015-12-21 LAB — WET PREP, GENITAL
SPERM: NONE SEEN
TRICH WET PREP: NONE SEEN
YEAST WET PREP: NONE SEEN

## 2015-12-21 LAB — POCT PREGNANCY, URINE: Preg Test, Ur: NEGATIVE

## 2015-12-21 MED ORDER — METRONIDAZOLE 500 MG PO TABS: 500.0000 mg | ORAL_TABLET | Freq: Two times a day (BID) | ORAL | 0 refills | Status: DC

## 2015-12-21 NOTE — MAU Provider Note (Signed)
History     CSN: [id number]  Arrival date and time: 12/21/15 1125   None     Chief Complaint  Patient presents with  . Abdominal Pain  . Emesis   HPI Lindsay Perez is 25 y.o. G1P0010 presents with epigastric pain, white vaginal discharge and odor X 1 week. Upper abdominal pain is intermittent, occurs with lying down and after eating. Vomited X 1 today.  Has 1 sexual partner X 1 yr, using OCPs for contraception.   Past Medical History:  Diagnosis Date  . Anemia    as a child  . Asthma    as a child  . Chlamydia   . Headache(784.0)    migranes  . Seasonal allergies   . Trichomonas infection     Past Surgical History:  Procedure Laterality Date  . NO PAST SURGERIES      Family History  Problem Relation Age of Onset  . Drug abuse Mother   . Mental illness Brother   . Mental retardation Brother     Social History  Substance Use Topics  . Smoking status: Current Some Day Smoker  . Smokeless tobacco: Never Used  . Alcohol use 11.4 oz/week    5 Glasses of wine, 14 Shots of liquor per week     Comment: occasionally    Allergies:  Allergies  Allergen Reactions  . Shellfish Allergy Swelling    Prescriptions Prior to Admission  Medication Sig Dispense Refill Last Dose  . albuterol (PROVENTIL HFA;VENTOLIN HFA) 108 (90 BASE) MCG/ACT inhaler Inhale into the lungs every 6 (six) hours as needed for wheezing or shortness of breath.   unknown at unknown  . Aspirin-Caffeine (BC FAST PAIN RELIEF PO) Take 1 packet by mouth daily as needed. For headache pain   Past Week at Unknown time  . ibuprofen (ADVIL,MOTRIN) 600 MG tablet Take 1 tablet (600 mg total) by mouth 3 (three) times daily. 30 tablet 1 04/05/2015 at Unknown time  . norgestimate-ethinyl estradiol (SPRINTEC 28) 0.25-35 MG-MCG tablet Take 1 tablet by mouth daily. 1 Package 1 04/05/2015 at Unknown time  . ondansetron (ZOFRAN ODT) 8 MG disintegrating tablet Take 1 tablet (8 mg total) by mouth every 8 (eight) hours  as needed for nausea or vomiting. 12 tablet 0   . terconazole (TERAZOL 7) 0.4 % vaginal cream Place 1 applicator vaginally at bedtime. Apply to external surface as well. (Patient not taking: Reported on 04/06/2015) 45 g 0     Review of Systems  Constitutional: Negative for chills and fever.  Respiratory: Negative for shortness of breath.   Cardiovascular: Negative for chest pain.  Gastrointestinal: Positive for abdominal pain (mid upper intermittent discomfort).  Genitourinary: Negative for dysuria, frequency, hematuria and urgency.       + for vaginal discharge and odor.  Neurological: Negative for headaches.   Physical Exam   Blood pressure 127/68, pulse 79, temperature 98.2 F (36.8 C), temperature source Oral, resp. rate 18, height 5' 1.25" (1.556 m), weight 217 lb 3.2 oz (98.5 kg), last menstrual period 12/11/2015.  Physical Exam  Constitutional: She is oriented to person, place, and time. She appears well-developed and well-nourished. No distress.  HENT:  Head: Normocephalic.  Neck: Normal range of motion.  Cardiovascular: Normal rate.   Respiratory: Effort normal.  GI: Soft. She exhibits no distension and no mass. There is no tenderness. There is no rebound and no guarding.  Genitourinary: There is no rash, tenderness or lesion on the right labia. There is no rash,  tenderness or lesion on the left labia. Uterus is not enlarged and not tender. Cervix exhibits friability. Cervix exhibits no motion tenderness and no discharge. Right adnexum displays no mass, no tenderness and no fullness. Left adnexum displays no mass, no tenderness and no fullness. No bleeding in the vagina. Vaginal discharge (with odor.  Discharge with white, frothy.) found.  Neurological: She is alert and oriented to person, place, and time.  Skin: Skin is warm and dry.  Psychiatric: She has a normal mood and affect. Her behavior is normal. Thought content normal.   Results for orders placed or performed during  the hospital encounter of 12/21/15 (from the past 24 hour(s))  Urinalysis, Routine w reflex microscopic (not at [facility])     Status: None   Collection Time: 12/21/15 11:50 AM  Result Value Ref Range   Color, Urine YELLOW YELLOW   APPearance CLEAR CLEAR   Specific Gravity, Urine 1.025 1.005 - 1.030   pH 6.0 5.0 - 8.0   Glucose, UA NEGATIVE NEGATIVE mg/dL   Hgb urine dipstick NEGATIVE NEGATIVE   Bilirubin Urine NEGATIVE NEGATIVE   Ketones, ur NEGATIVE NEGATIVE mg/dL   Protein, ur NEGATIVE NEGATIVE mg/dL   Nitrite NEGATIVE NEGATIVE   Leukocytes, UA NEGATIVE NEGATIVE  Pregnancy, urine POC     Status: None   Collection Time: 12/21/15 12:03 PM  Result Value Ref Range   Preg Test, Ur NEGATIVE NEGATIVE  Wet prep, genital     Status: Abnormal   Collection Time: 12/21/15  5:53 PM  Result Value Ref Range   Yeast Wet Prep HPF POC NONE SEEN NONE SEEN   Trich, Wet Prep NONE SEEN NONE SEEN   Clue Cells Wet Prep HPF POC PRESENT (A) NONE SEEN   WBC, Wet Prep HPF POC MODERATE (A) NONE SEEN   Sperm NONE SEEN     MAU Course  Procedures  GC/CHL and HIV results pending  MDM MSE Labs Exam Rx for home to treat vaginitis  Assessment and Plan  A: Vaginal discharge and odor      Bacterial Vaginosis      Epigastric pain  P :Suggested patient avoid spicy, fried foods and try OTC antacids--If sxs worsen          needs to see PCP for further evaluation    Rx for Flagyl  ETOH warning given     Patient aware she will be called if pending labs are positive        KEY,EVE M 12/21/2015, 6:55 PM

## 2015-12-21 NOTE — MAU Note (Addendum)
Woke up, started throwing up.  Thinks her ph is off, urine is dark.  Having sharp pains mid abd, started 2-3 days ago, hurting a lot, couldn't move. D/c had an odor

## 2015-12-21 NOTE — MAU Note (Signed)
Pt states she has been having some thick white discharge that smells for two days now.

## 2015-12-21 NOTE — Discharge Instructions (Signed)
Bacterial Vaginosis °Bacterial vaginosis is a vaginal infection that occurs when the normal balance of bacteria in the vagina is disrupted. It results from an overgrowth of certain bacteria. This is the most common vaginal infection in women of childbearing age. Treatment is important to prevent complications, especially in pregnant women, as it can cause a premature delivery. °CAUSES  °Bacterial vaginosis is caused by an increase in harmful bacteria that are normally present in smaller amounts in the vagina. Several different kinds of bacteria can cause bacterial vaginosis. However, the reason that the condition develops is not fully understood. °RISK FACTORS °Certain activities or behaviors can put you at an increased risk of developing bacterial vaginosis, including: °· Having a new sex partner or multiple sex partners. °· Douching. °· Using an intrauterine device (IUD) for contraception. °Women do not get bacterial vaginosis from toilet seats, bedding, swimming pools, or contact with objects around them. °SIGNS AND SYMPTOMS  °Some women with bacterial vaginosis have no signs or symptoms. Common symptoms include: °· Grey vaginal discharge. °· A fishlike odor with discharge, especially after sexual intercourse. °· Itching or burning of the vagina and vulva. °· Burning or pain with urination. °DIAGNOSIS  °Your health care provider will take a medical history and examine the vagina for signs of bacterial vaginosis. A sample of vaginal fluid may be taken. Your health care provider will look at this sample under a microscope to check for bacteria and abnormal cells. A vaginal pH test may also be done.  °TREATMENT  °Bacterial vaginosis may be treated with antibiotic medicines. These may be given in the form of a pill or a vaginal cream. A second round of antibiotics may be prescribed if the condition comes back after treatment. Because bacterial vaginosis increases your risk for sexually transmitted diseases, getting  treated can help reduce your risk for chlamydia, gonorrhea, HIV, and herpes. °HOME CARE INSTRUCTIONS  °· Only take over-the-counter or prescription medicines as directed by your health care provider. °· If antibiotic medicine was prescribed, take it as directed. Make sure you finish it even if you start to feel better. °· Tell all sexual partners that you have a vaginal infection. They should see their health care provider and be treated if they have problems, such as a mild rash or itching. °· During treatment, it is important that you follow these instructions: °· Avoid sexual activity or use condoms correctly. °· Do not douche. °· Avoid alcohol as directed by your health care provider. °· Avoid breastfeeding as directed by your health care provider. °SEEK MEDICAL CARE IF:  °· Your symptoms are not improving after 3 days of treatment. °· You have increased discharge or pain. °· You have a fever. °MAKE SURE YOU:  °· Understand these instructions. °· Will watch your condition. °· Will get help right away if you are not doing well or get worse. °FOR MORE INFORMATION  °Centers for Disease Control and Prevention, Division of STD Prevention: www.cdc.gov/std °American Sexual Health Association (ASHA): www.ashastd.org  °  °This information is not intended to replace advice given to you by your health care provider. Make sure you discuss any questions you have with your health care provider. °  °Document Released: 01/31/2005 Document Revised: 02/21/2014 Document Reviewed: 09/12/2012 °Elsevier Interactive Patient Education ©2016 Else[employer] ° °Abdominal Pain, Adult °Many things can cause belly (abdominal) pain. Most times, the belly pain is not dangerous. Many cases of belly pain can be watched and treated at home. °HOME CARE  °· Do not   not take medicines that help you go poop (laxatives) unless told to by your doctor.  Only take medicine as told by your doctor.  Eat or drink as told by your doctor. Your doctor will  tell you if you should be on a special diet. GET HELP IF:  You do not know what is causing your belly pain.  You have belly pain while you are sick to your stomach (nauseous) or have runny poop (diarrhea).  You have pain while you pee or poop.  Your belly pain wakes you up at night.  You have belly pain that gets worse or better when you eat.  You have belly pain that gets worse when you eat fatty foods.  You have a fever. GET HELP RIGHT AWAY IF:   The pain does not go away within 2 hours.  You keep throwing up (vomiting).  The pain changes and is only in the right or left part of the belly.  You have bloody or tarry looking poop. MAKE SURE YOU:   Understand these instructions.  Will watch your condition.  Will get help right away if you are not doing well or get worse.   This information is not intended to replace advice given to you by your health care provider. Make sure you discuss any questions you have with your health care provider.   Document Released: 07/20/2007 Document Revised: 02/21/2014 Document Reviewed: 10/10/2012 Elsevier Interactive Patient Education [employer]

## 2015-12-22 LAB — GC/CHLAMYDIA PROBE AMP (~~LOC~~) NOT AT ARMC
CHLAMYDIA, DNA PROBE: NEGATIVE
Neisseria Gonorrhea: NEGATIVE

## 2016-03-02 ENCOUNTER — Ambulatory Visit: Payer: No Typology Code available for payment source | Admitting: Obstetrics

## 2016-03-15 ENCOUNTER — Ambulatory Visit (INDEPENDENT_AMBULATORY_CARE_PROVIDER_SITE_OTHER): Payer: BLUE CROSS/BLUE SHIELD | Admitting: Obstetrics

## 2016-03-15 DIAGNOSIS — G43919 Migraine, unspecified, intractable, without status migrainosus: Secondary | ICD-10-CM

## 2016-03-15 DIAGNOSIS — Z3009 Encounter for other general counseling and advice on contraception: Secondary | ICD-10-CM

## 2016-03-15 DIAGNOSIS — Z0000 Encounter for general adult medical examination without abnormal findings: Secondary | ICD-10-CM

## 2016-03-15 DIAGNOSIS — Z01419 Encounter for gynecological examination (general) (routine) without abnormal findings: Secondary | ICD-10-CM | POA: Diagnosis not present

## 2016-03-15 DIAGNOSIS — Z113 Encounter for screening for infections with a predominantly sexual mode of transmission: Secondary | ICD-10-CM

## 2016-03-15 DIAGNOSIS — Z124 Encounter for screening for malignant neoplasm of cervix: Secondary | ICD-10-CM

## 2016-03-15 LAB — POCT URINE PREGNANCY: PREG TEST UR: NEGATIVE

## 2016-03-15 MED ORDER — IBUPROFEN 800 MG PO TABS: 800.0000 mg | ORAL_TABLET | Freq: Three times a day (TID) | ORAL | 5 refills | Status: DC | PRN

## 2016-03-15 MED ORDER — NORGESTIMATE-ETH ESTRADIOL 0.25-35 MG-MCG PO TABS: 1.0000 | ORAL_TABLET | Freq: Every day | ORAL | 1 refills | Status: DC

## 2016-03-15 NOTE — Progress Notes (Signed)
Patient is due her annual exam - she also wants to discuss birth control- she missed some pills- and then started her cycle late.

## 2016-03-15 NOTE — Progress Notes (Signed)
Subjective:        Lindsay Perez is a 26 y.o. female here for a routine exam.  Current complaints: None.    Personal health questionnaire:  Is patient [name], have a family history of breast and/or ovarian cancer: no Is there a family history of uterine cancer diagnosed at age < 30, gastrointestinal cancer, urinary tract cancer, family member who is a Personnel officer syndrome-associated carrier: no Is the patient overweight and hypertensive, family history of diabetes, personal history of gestational diabetes, preeclampsia or PCOS: no Is patient over 34, have PCOS,  family history of premature CHD under age 96, diabetes, smoke, have hypertension or peripheral artery disease:  no At any time, has a partner hit, kicked or otherwise hurt or frightened you?: no Over the past 2 weeks, have you felt down, depressed or hopeless?: no Over the past 2 weeks, have you felt little interest or pleasure in doing things?:no   Gynecologic History No LMP recorded. Contraception: OCP (estrogen/progesterone) Last Pap: ~2016. Results were: normal Last mammogram: n/a. Results were: n/a  Obstetric History OB History  Gravida Para Term Preterm AB Living  1       1 0  SAB TAB Ectopic Multiple Live Births  1            # Outcome Date GA Lbr Len/2nd Weight Sex Delivery Anes PTL Lv  1 SAB               Past Medical History:  Diagnosis Date  . Anemia    as a child  . Asthma    as a child  . Chlamydia   . Headache(784.0)    migranes  . Seasonal allergies   . Trichomonas infection     Past Surgical History:  Procedure Laterality Date  . NO PAST SURGERIES       Current Outpatient Prescriptions:  .  albuterol (PROVENTIL HFA;VENTOLIN HFA) 108 (90 BASE) MCG/ACT inhaler, Inhale into the lungs every 6 (six) hours as needed for wheezing or shortness of breath., Disp: , Rfl:  .  ibuprofen (ADVIL,MOTRIN) 600 MG tablet, Take 1 tablet (600 mg total) by mouth 3 (three) times daily., Disp: 30  tablet, Rfl: 1 .  norgestimate-ethinyl estradiol (SPRINTEC 28) 0.25-35 MG-MCG tablet, Take 1 tablet by mouth daily., Disp: 1 Package, Rfl: 1 .  ibuprofen (ADVIL,MOTRIN) 800 MG tablet, Take 1 tablet (800 mg total) by mouth every 8 (eight) hours as needed., Disp: 30 tablet, Rfl: 5 Allergies  Allergen Reactions  . Shellfish Allergy Swelling    Social History  Substance Use Topics  . Smoking status: Current Some Day Smoker  . Smokeless tobacco: Never Used  . Alcohol use 11.4 oz/week    5 Glasses of wine, 14 Shots of liquor per week     Comment: occasionally    Family History  Problem Relation Age of Onset  . Drug abuse Mother   . Mental illness Brother   . Mental retardation Brother       Review of Systems  Constitutional: negative for fatigue and weight loss Respiratory: negative for cough and wheezing Cardiovascular: negative for chest pain, fatigue and palpitations Gastrointestinal: negative for abdominal pain and change in bowel habits Musculoskeletal:negative for myalgias Neurological: negative for gait problems and tremors Behavioral/Psych: negative for abusive relationship, depression Endocrine: negative for temperature intolerance    Genitourinary:negative for abnormal menstrual periods, genital lesions, hot flashes, sexual problems and vaginal discharge Integument/breast: negative for breast lump, breast tenderness, nipple discharge and skin  lesion(s)    Objective:       There were no vitals taken for this visit. General:   alert  Skin:   no rash or abnormalities  Lungs:   clear to auscultation bilaterally  Heart:   regular rate and rhythm, S1, S2 normal, no murmur, click, rub or gallop  Breasts:   normal without suspicious masses, skin or nipple changes or axillary nodes  Abdomen:  normal findings: no organomegaly, soft, non-tender and no hernia  Pelvis:  External genitalia: normal general appearance Urinary system: urethral meatus normal and bladder without  fullness, nontender Vaginal: normal without tenderness, induration or masses Cervix: normal appearance Adnexa: normal bimanual exam Uterus: anteverted and non-tender, normal size   Lab Review Urine pregnancy test Labs reviewed yes Radiologic studies reviewed no  50% of 20 min visit spent on counseling and coordination of care.    Assessment:    Healthy female exam.    Contraceptive counseling and advice  Screening for STD    Plan:    Sprintec 28 Rx  Education reviewed: calcium supplements, low fat, low cholesterol diet, safe sex/STD prevention, self breast exams and weight bearing exercise. Contraception: OCP (estrogen/progesterone). Follow up in: 1 year.   Meds ordered this encounter  Medications  . norgestimate-ethinyl estradiol (SPRINTEC 28) 0.25-35 MG-MCG tablet    Sig: Take 1 tablet by mouth daily.    Dispense:  1 Package    Refill:  1  . ibuprofen (ADVIL,MOTRIN) 800 MG tablet    Sig: Take 1 tablet (800 mg total) by mouth every 8 (eight) hours as needed.    Dispense:  30 tablet    Refill:  5   Orders Placed This Encounter  Procedures  . POCT urine pregnancy      Patient ID: Lindsay Perez, female   DOB: 10/05/1990, 26 y.o.   MRN: [id number]

## 2016-03-16 ENCOUNTER — Encounter: Payer: Self-pay | Admitting: Obstetrics

## 2016-03-16 ENCOUNTER — Other Ambulatory Visit: Payer: Self-pay | Admitting: Obstetrics

## 2016-03-16 DIAGNOSIS — N760 Acute vaginitis: Principal | ICD-10-CM

## 2016-03-16 DIAGNOSIS — B9689 Other specified bacterial agents as the cause of diseases classified elsewhere: Secondary | ICD-10-CM

## 2016-03-16 LAB — CYTOLOGY - PAP: DIAGNOSIS: NEGATIVE

## 2016-03-16 LAB — CERVICOVAGINAL ANCILLARY ONLY
Bacterial vaginitis: POSITIVE — AB
Candida vaginitis: NEGATIVE
Chlamydia: NEGATIVE
NEISSERIA GONORRHEA: NEGATIVE
Trichomonas: NEGATIVE

## 2016-03-16 MED ORDER — TINIDAZOLE 500 MG PO TABS: 1000.0000 mg | ORAL_TABLET | Freq: Every day | ORAL | 2 refills | Status: DC

## 2016-06-06 ENCOUNTER — Other Ambulatory Visit: Payer: Self-pay | Admitting: Obstetrics

## 2016-06-06 DIAGNOSIS — Z3009 Encounter for other general counseling and advice on contraception: Secondary | ICD-10-CM

## 2016-06-06 NOTE — Telephone Encounter (Signed)
Refills sent to pharmacy.  Pt had AEX in January.

## 2016-08-24 ENCOUNTER — Encounter (HOSPITAL_COMMUNITY): Payer: Self-pay | Admitting: Emergency Medicine

## 2016-08-24 ENCOUNTER — Emergency Department (HOSPITAL_COMMUNITY)
Admission: EM | Admit: 2016-08-24 | Discharge: 2016-08-24 | Disposition: A | Discharge status: Home / Self Care | Payer: BLUE CROSS/BLUE SHIELD | Attending: Emergency Medicine | Admitting: Emergency Medicine

## 2016-08-24 DIAGNOSIS — R220 Localized swelling, mass and lump, head: Secondary | ICD-10-CM | POA: Diagnosis not present

## 2016-08-24 DIAGNOSIS — K029 Dental caries, unspecified: Secondary | ICD-10-CM | POA: Insufficient documentation

## 2016-08-24 DIAGNOSIS — J45909 Unspecified asthma, uncomplicated: Secondary | ICD-10-CM | POA: Insufficient documentation

## 2016-08-24 DIAGNOSIS — F17200 Nicotine dependence, unspecified, uncomplicated: Secondary | ICD-10-CM | POA: Insufficient documentation

## 2016-08-24 DIAGNOSIS — Z79899 Other long term (current) drug therapy: Secondary | ICD-10-CM | POA: Diagnosis not present

## 2016-08-24 DIAGNOSIS — K0889 Other specified disorders of teeth and supporting structures: Secondary | ICD-10-CM

## 2016-08-24 MED ORDER — HYDROCODONE-ACETAMINOPHEN 5-325 MG PO TABS
1.0000 | ORAL_TABLET | Freq: Once | ORAL | Status: AC
  Administered 2016-08-24: 1 via ORAL
  Filled 2016-08-24: qty 1

## 2016-08-24 MED ORDER — ACETAMINOPHEN 325 MG PO TABS
650.0000 mg | ORAL_TABLET | Freq: Once | ORAL | Status: AC
  Administered 2016-08-24: 650 mg via ORAL
  Filled 2016-08-24: qty 2

## 2016-08-24 MED ORDER — AMOXICILLIN-POT CLAVULANATE 875-125 MG PO TABS
1.0000 | ORAL_TABLET | Freq: Two times a day (BID) | ORAL | 0 refills | Status: DC
Start: 2016-08-24 — End: 2017-03-27

## 2016-08-24 NOTE — ED Notes (Signed)
ED Provider at bedside. 

## 2016-08-24 NOTE — ED Triage Notes (Signed)
Pt c/o left side dental pain at side of fractured lower left molar and painful upper left molar, rotten taste in mouth, mild left face swelling and soreness. Uses orogel, which initially helped but not anymore.

## 2016-08-24 NOTE — ED Notes (Signed)
Pt verbalized understanding of discharge instructions and denies any further questions at this time.    Pt has ride home available.

## 2016-08-24 NOTE — ED Provider Notes (Signed)
[facility]-EMERGENCY DEPT Provider Note   CSN: [id number] Arrival date & time: 08/24/16  1841     History   Chief Complaint Chief Complaint  Patient presents with  . Dental Pain    HPI Lindsay Perez is a 26 y.o. female who presents today for evaluation of one and half weeks of left lower dental pain that has caused her face to start swelling today. She reports that she has previously had dental infections which resulted in her needing to have teeth pulled. She denies any fevers or chills, no nausea/vomiting/diarrhea. She has been trying Tylenol, and Orajel at home. These have provided mild relief. Additionally she reports that she is unable to take ibuprofen due to an ulcer, however tried taking ibuprofen for her pain which provided mild relief. She reports that she has not had any Tylenol or ibuprofen today. HPI  Past Medical History:  Diagnosis Date  . Anemia    as a child  . Asthma    as a child  . Chlamydia   . Headache(784.0)    migranes  . Seasonal allergies   . Trichomonas infection     Patient Active Problem List   Diagnosis Date Noted  . Upper back pain 10/28/2013  . Obesity (BMI 30.0-34.9) 10/28/2013  . Asthma     Past Surgical History:  Procedure Laterality Date  . NO PAST SURGERIES      OB History    Gravida Para Term Preterm AB Living   1       1 0   SAB TAB Ectopic Multiple Live Births   1               Home Medications    Prior to Admission medications   Medication Sig Start Date End Date Taking? Authorizing Provider  albuterol (PROVENTIL HFA;VENTOLIN HFA) 108 (90 BASE) MCG/ACT inhaler Inhale into the lungs every 6 (six) hours as needed for wheezing or shortness of breath.    [provider]  amoxicillin-clavulanate (AUGMENTIN) 875-125 MG tablet Take 1 tablet by mouth every 12 (twelve) hours. 08/24/16   Cristina Gong, PA-C  ibuprofen (ADVIL,MOTRIN) 600 MG tablet Take 1 tablet (600 mg total) by mouth 3 (three) times daily. 02/28/14    Leftwich-Kirby, Wilmer Floor, CNM  ibuprofen (ADVIL,MOTRIN) 800 MG tablet Take 1 tablet (800 mg total) by mouth every 8 (eight) hours as needed. 03/15/16   Brock Bad, MD  SPRINTEC 28 0.25-35 MG-MCG tablet TAKE ONE TABLET BY MOUTH ONCE DAILY 06/06/16   Brock Bad, MD  tinidazole [facility]) 500 MG tablet Take 2 tablets (1,000 mg total) by mouth daily with breakfast. 03/16/16   Brock Bad, MD    Family History Family History  Problem Relation Age of Onset  . Drug abuse Mother   . Mental illness Brother   . Mental retardation Brother     Social History Social History  Substance Use Topics  . Smoking status: Current Some Day Smoker  . Smokeless tobacco: Never Used  . Alcohol use 11.4 oz/week    5 Glasses of wine, 14 Shots of liquor per week     Comment: occasionally     Allergies   Shellfish allergy   Review of Systems Review of Systems  Constitutional: Negative for chills, fatigue and fever.  HENT: Positive for dental problem and facial swelling. Negative for congestion, drooling, ear pain, sinus pain, sinus pressure, sore throat, tinnitus and trouble swallowing.      Physical Exam Updated Vital Signs  BP 121/87 (BP Location: Left Arm)   Pulse 81   Temp 98.8 F (37.1 C) (Oral)   Resp 17   Ht 5' 1.75" (1.568 m)   Wt 104.3 kg (230 lb)   SpO2 100%   BMI 42.41 kg/m   Physical Exam  Constitutional: She appears well-developed and well-nourished.  HENT:  Head: Normocephalic and atraumatic.  Right Ear: External ear normal.  Left Ear: External ear normal.  Nose: Nose normal.  Mouth/Throat: Oropharynx is clear and moist and mucous membranes are normal. No oral lesions. No trismus in the jaw. Abnormal dentition. Dental caries present. No dental abscesses or uvula swelling.    No elevation to floor of mouth.  Face has very mild swelling to left jaw that does not extend into the neck.    Eyes: Conjunctivae are normal. Right eye exhibits no discharge. Left eye  exhibits no discharge.  Neck: Normal range of motion. Neck supple. No tracheal deviation present.  Pulmonary/Chest: No stridor.  Lymphadenopathy:    She has no cervical adenopathy.  Neurological: She is alert.  Skin: Skin is warm and dry. She is not diaphoretic.  Psychiatric: She has a normal mood and affect. Her behavior is normal.  Nursing note and vitals reviewed.    ED Treatments / Results  Labs (all labs ordered are listed, but only abnormal results are displayed) Labs Reviewed - No data to display  EKG  EKG Interpretation None       Radiology No results found.  Procedures Procedures (including critical care time)  Medications Ordered in ED Medications  HYDROcodone-acetaminophen (NORCO/VICODIN) 5-325 MG per tablet 1 tablet (not administered)  acetaminophen (TYLENOL) tablet 650 mg (not administered)     Initial Impression / Assessment and Plan / ED Course  I have reviewed the triage vital signs and the nursing notes.  Pertinent labs & imaging results that were available during my care of the patient were reviewed by me and considered in my medical decision making (see chart for details).    Patient with toothache.  No gross abscess.  Exam unconcerning for Ludwig's angina or spread of infection, no elevation to floor of mouth..  Will treat with augmentin and pain medicine.  Urged patient to follow-up with dentist.  Patient was offered a dental block but refused at this time. Patient is aware that her antibiotics will make her birth control not effective and has been advised to use backup contraception.   Final Clinical Impressions(s) / ED Diagnoses   Final diagnoses:  Pain, dental    New Prescriptions New Prescriptions   AMOXICILLIN-CLAVULANATE (AUGMENTIN) 875-125 MG TABLET    Take 1 tablet by mouth every 12 (twelve) hours.     Cristina GongHammond, Elizabeth W, Cordelia Poche-C 08/25/16 Serena [id number]    Alvira MondaySchlossman, Erin, MD 08/28/16 2039

## 2016-08-24 NOTE — ED Notes (Signed)
Reports that she can not take ibuprofen bc of an ulcer. Has been using ora-gel and other OTC meds with no relief.

## 2016-08-24 NOTE — Discharge Instructions (Signed)
Please take tylenol for your pain.  You may take up to 1,000 mg (2 extra strength pills )of tylenol every 8 hours.  Do not take more than 4,000 mg of tylenol (8 extra strength tylenol pills) in 24 hours or you may harm your liver. Do not drink alcohol while taking Tylenol.  Please be aware that taking antibiotics will make your hormone based birth control not work.  Please use backup protection to avoid pregnancy.    Your antibiotics may cause you to have diarrhea. Please stay well hydrated. Some people find that taking probiotics may help decrease the diarrhea.  Today you received a medication which may make you sleepy/drowsy. For the next 12 hours please do not drive, operate heavy machinery, consume alcohol, or do anything that may cause harm to you or someone else if you were to be sleepy, drowsy, or make a impaired decision.

## 2017-03-27 ENCOUNTER — Inpatient Hospital Stay (HOSPITAL_COMMUNITY)
Admission: AD | Admit: 2017-03-27 | Discharge: 2017-03-27 | Disposition: A | Discharge status: Home / Self Care | Payer: Medicaid Other | Source: Ambulatory Visit | Attending: Obstetrics & Gynecology | Admitting: Obstetrics & Gynecology

## 2017-03-27 ENCOUNTER — Inpatient Hospital Stay (HOSPITAL_COMMUNITY): Payer: Medicaid Other

## 2017-03-27 ENCOUNTER — Encounter (HOSPITAL_COMMUNITY): Payer: Self-pay | Admitting: *Deleted

## 2017-03-27 DIAGNOSIS — O210 Mild hyperemesis gravidarum: Secondary | ICD-10-CM | POA: Diagnosis present

## 2017-03-27 DIAGNOSIS — N760 Acute vaginitis: Secondary | ICD-10-CM

## 2017-03-27 DIAGNOSIS — R1010 Upper abdominal pain, unspecified: Secondary | ICD-10-CM | POA: Insufficient documentation

## 2017-03-27 DIAGNOSIS — Z818 Family history of other mental and behavioral disorders: Secondary | ICD-10-CM | POA: Insufficient documentation

## 2017-03-27 DIAGNOSIS — Z79899 Other long term (current) drug therapy: Secondary | ICD-10-CM | POA: Diagnosis not present

## 2017-03-27 DIAGNOSIS — O23591 Infection of other part of genital tract in pregnancy, first trimester: Secondary | ICD-10-CM | POA: Insufficient documentation

## 2017-03-27 DIAGNOSIS — O26891 Other specified pregnancy related conditions, first trimester: Secondary | ICD-10-CM

## 2017-03-27 DIAGNOSIS — O99611 Diseases of the digestive system complicating pregnancy, first trimester: Secondary | ICD-10-CM | POA: Diagnosis not present

## 2017-03-27 DIAGNOSIS — O99511 Diseases of the respiratory system complicating pregnancy, first trimester: Secondary | ICD-10-CM | POA: Diagnosis not present

## 2017-03-27 DIAGNOSIS — K219 Gastro-esophageal reflux disease without esophagitis: Secondary | ICD-10-CM | POA: Diagnosis present

## 2017-03-27 DIAGNOSIS — Z91013 Allergy to seafood: Secondary | ICD-10-CM | POA: Insufficient documentation

## 2017-03-27 DIAGNOSIS — R109 Unspecified abdominal pain: Secondary | ICD-10-CM

## 2017-03-27 DIAGNOSIS — Z3490 Encounter for supervision of normal pregnancy, unspecified, unspecified trimester: Secondary | ICD-10-CM

## 2017-03-27 DIAGNOSIS — B9689 Other specified bacterial agents as the cause of diseases classified elsewhere: Secondary | ICD-10-CM | POA: Diagnosis not present

## 2017-03-27 DIAGNOSIS — Z3A10 10 weeks gestation of pregnancy: Secondary | ICD-10-CM | POA: Diagnosis not present

## 2017-03-27 DIAGNOSIS — Z791 Long term (current) use of non-steroidal anti-inflammatories (NSAID): Secondary | ICD-10-CM | POA: Insufficient documentation

## 2017-03-27 DIAGNOSIS — Z813 Family history of other psychoactive substance abuse and dependence: Secondary | ICD-10-CM | POA: Insufficient documentation

## 2017-03-27 DIAGNOSIS — O26899 Other specified pregnancy related conditions, unspecified trimester: Secondary | ICD-10-CM

## 2017-03-27 HISTORY — DX: Gastro-esophageal reflux disease without esophagitis: K21.9

## 2017-03-27 LAB — URINALYSIS, ROUTINE W REFLEX MICROSCOPIC
Bilirubin Urine: NEGATIVE
Glucose, UA: NEGATIVE mg/dL
Hgb urine dipstick: NEGATIVE
KETONES UR: 5 mg/dL — AB
LEUKOCYTES UA: NEGATIVE
Nitrite: NEGATIVE
PH: 6 (ref 5.0–8.0)
PROTEIN: NEGATIVE mg/dL
Specific Gravity, Urine: 1.026 (ref 1.005–1.030)

## 2017-03-27 LAB — CBC
HCT: 33.1 % — ABNORMAL LOW (ref 36.0–46.0)
Hemoglobin: 11 g/dL — ABNORMAL LOW (ref 12.0–15.0)
MCH: 25.6 pg — AB (ref 26.0–34.0)
MCHC: 33.2 g/dL (ref 30.0–36.0)
MCV: 77.2 fL — AB (ref 78.0–100.0)
PLATELETS: 307 10*3/uL (ref 150–400)
RBC: 4.29 MIL/uL (ref 3.87–5.11)
RDW: 15.1 % (ref 11.5–15.5)
WBC: 9.2 10*3/uL (ref 4.0–10.5)

## 2017-03-27 LAB — HCG, QUANTITATIVE, PREGNANCY: HCG, BETA CHAIN, QUANT, S: 68826 m[IU]/mL — AB (ref <5)

## 2017-03-27 LAB — WET PREP, GENITAL
Sperm: NONE SEEN
TRICH WET PREP: NONE SEEN
Yeast Wet Prep HPF POC: NONE SEEN

## 2017-03-27 LAB — POCT PREGNANCY, URINE: Preg Test, Ur: POSITIVE — AB

## 2017-03-27 MED ORDER — PRENATAL COMPLETE 14-0.4 MG PO TABS: 1.0000 | ORAL_TABLET | Freq: Every day | ORAL | 12 refills | Status: DC

## 2017-03-27 MED ORDER — METOCLOPRAMIDE HCL 10 MG PO TABS: 10.0000 mg | ORAL_TABLET | Freq: Four times a day (QID) | ORAL | 0 refills | Status: DC

## 2017-03-27 MED ORDER — METRONIDAZOLE 500 MG PO TABS: 500.0000 mg | ORAL_TABLET | Freq: Two times a day (BID) | ORAL | 0 refills | Status: AC

## 2017-03-27 MED ORDER — RANITIDINE HCL 150 MG PO TABS: 150.0000 mg | ORAL_TABLET | Freq: Two times a day (BID) | ORAL | 0 refills | Status: DC

## 2017-03-27 NOTE — MAU Note (Signed)
Pt reports upper abdominal pain that hurts with movement-states the pain is crampy and tight. Pt reports a lot of nausea and vomiting. States she has vomited every morning for the past week, but yesterday she was unable to keep anything down. Pt states she has been able to keep food down. States she had a +upt last night. Denies vaginal bleeding. States [facility]

## 2017-03-27 NOTE — MAU Provider Note (Signed)
History     CSN: [id number]  Arrival date and time: 03/27/17 4098   First Provider Initiated Contact with Patient 03/27/17 2151      Chief Complaint  Patient presents with  . Abdominal Pain   HPI  [name] is a 27 y.o. year old [id number] female at 10.[redacted] weeks gestation by LMP who presents to MAU reporting abdominal pain, missed period, nausea and vomiting. She reports that the most of her pain is in the upper portion of her abdomen. She has a h/o GERD, but not taking her Rx meds for it. She reports taking OTC meds that work better. She has a She denies any VB or LOF.  Past Medical History:  Diagnosis Date  . Anemia    as a child  . Asthma    as a child  . Chlamydia   . GERD (gastroesophageal reflux disease)   . Headache(784.0)    migranes  . Seasonal allergies   . Trichomonas infection     Past Surgical History:  Procedure Laterality Date  . NO PAST SURGERIES      Family History  Problem Relation Age of Onset  . Drug abuse Mother   . Mental illness Brother   . Mental retardation Brother     Social History   Tobacco Use  . Smoking status: Never Smoker  . Smokeless tobacco: Never Used  Substance Use Topics  . Alcohol use: Yes    Alcohol/week: 11.4 oz    Types: 5 Glasses of wine, 14 Shots of liquor per week    Comment: last drink 2 wks ago  . Drug use: Yes    Frequency: 7.0 times per week    Types: Marijuana    Comment: last use in Jan 2019    Allergies:  Allergies  Allergen Reactions  . Shellfish Allergy Swelling    Medications Prior to Admission  Medication Sig Dispense Refill Last Dose  . albuterol (PROVENTIL HFA;VENTOLIN HFA) 108 (90 BASE) MCG/ACT inhaler Inhale into the lungs every 6 (six) hours as needed for wheezing or shortness of breath.   More than a month at Unknown time  . amoxicillin-clavulanate (AUGMENTIN) 875-125 MG tablet Take 1 tablet by mouth every 12 (twelve) hours. 14 tablet 0 More than a month at Unknown time  .  ibuprofen (ADVIL,MOTRIN) 600 MG tablet Take 1 tablet (600 mg total) by mouth 3 (three) times daily. 30 tablet 1 More than a month at Unknown time  . ibuprofen (ADVIL,MOTRIN) 800 MG tablet Take 1 tablet (800 mg total) by mouth every 8 (eight) hours as needed. 30 tablet 5 More than a month at Unknown time  . SPRINTEC 28 0.25-35 MG-MCG tablet TAKE ONE TABLET BY MOUTH ONCE DAILY 28 tablet 11 More than a month at Unknown time  . tinidazole (TINDAMAX) 500 MG tablet Take 2 tablets (1,000 mg total) by mouth daily with breakfast. 10 tablet 2 More than a month at Unknown time    Review of Systems  Constitutional: Negative.   HENT: Negative.   Eyes: Negative.   Respiratory: Negative.   Gastrointestinal: Positive for abdominal pain, nausea and vomiting.  Endocrine: Negative.   Genitourinary: Positive for menstrual problem (missed period).  Musculoskeletal: Negative.   Skin: Negative.   Allergic/Immunologic: Negative.   Neurological: Negative.   Hematological: Negative.   Psychiatric/Behavioral: Negative.    Physical Exam   Blood pressure 119/68, pulse 80, temperature 98.2 F (36.8 C), temperature source Oral, resp. rate 20, height 5\' 2"  (1.575  m), weight 240 lb (108.9 kg), last menstrual period 01/14/2017, SpO2 97 %.  Physical Exam  Nursing note and vitals reviewed. Constitutional: She is oriented to person, place, and time. She appears well-developed and well-nourished.  HENT:  Head: Normocephalic and atraumatic.  Eyes: Pupils are equal, round, and reactive to light.  Neck: Normal range of motion.  Cardiovascular: Normal rate, regular rhythm and normal heart sounds.  Respiratory: Effort normal and breath sounds normal.  GI: Soft. Bowel sounds are normal.  Genitourinary:  Genitourinary Comments: Uterus: non-tender, enlarged, cx: smooth, pink, no lesions, scant amt of thick, white, malodorous vaginal d/c, closed/long/firm, no CMT or friability, no adnexal tenderness   Musculoskeletal: Normal  range of motion.  Neurological: She is alert and oriented to person, place, and time. She has normal reflexes.  Skin: Skin is warm and dry.  Psychiatric: She has a normal mood and affect. Her behavior is normal. Judgment and thought content normal.    MAU Course  Procedures  MDM CCUA UPT CBC ABO/Rh (known O POS) HCG OB U/S <14 wks OB < 14 wks TVUS Wet Prep GC/CT -- pending HIV -- pending  Results for orders placed or performed during the hospital encounter of 03/27/17 (from the past 24 hour(s))  Urinalysis, Routine w reflex microscopic (not at [facility])     Status: Abnormal   Collection Time: 03/27/17  7:21 PM  Result Value Ref Range   Color, Urine YELLOW YELLOW   APPearance HAZY (A) CLEAR   Specific Gravity, Urine 1.026 1.005 - 1.030   pH 6.0 5.0 - 8.0   Glucose, UA NEGATIVE NEGATIVE mg/dL   Hgb urine dipstick NEGATIVE NEGATIVE   Bilirubin Urine NEGATIVE NEGATIVE   Ketones, ur 5 (A) NEGATIVE mg/dL   Protein, ur NEGATIVE NEGATIVE mg/dL   Nitrite NEGATIVE NEGATIVE   Leukocytes, UA NEGATIVE NEGATIVE  Pregnancy, urine POC     Status: Abnormal   Collection Time: 03/27/17  7:41 PM  Result Value Ref Range   Preg Test, Ur POSITIVE (A) NEGATIVE  CBC     Status: Abnormal   Collection Time: 03/27/17  9:54 PM  Result Value Ref Range   WBC 9.2 4.0 - 10.5 K/uL   RBC 4.29 3.87 - 5.11 MIL/uL   Hemoglobin 11.0 (L) 12.0 - 15.0 g/dL   HCT 09.8 (L) 11.9 - 14.7 %   MCV 77.2 (L) 78.0 - 100.0 fL   MCH 25.6 (L) 26.0 - 34.0 pg   MCHC 33.2 30.0 - 36.0 g/dL   RDW 82.9 56.2 - 13.0 %   Platelets 307 150 - 400 K/uL  Wet prep, genital     Status: Abnormal   Collection Time: 03/27/17  9:59 PM  Result Value Ref Range   Yeast Wet Prep HPF POC NONE SEEN NONE SEEN   Trich, Wet Prep NONE SEEN NONE SEEN   Clue Cells Wet Prep HPF POC PRESENT (A) NONE SEEN   WBC, Wet Prep HPF POC FEW (A) NONE SEEN   Sperm NONE SEEN    U/S: IUP at 8.6 wks by CRL with YS, FP & embryo, normal ovaries, no SCH -- new  EDC: 10/31/2017  Assessment and Plan  Bacterial vaginitis  Abdominal pain affecting pregnancy - May take Tylenol for abdominal pain  Intrauterine pregnancy - Establish [facility] - Rx for PNV - OB Provider list given - Pregnancy verification Letter given - Pregnancy education material given Gastroesophageal reflux disease, esophagitis presence not specified - Rx for Zantac 150 mg BID  - Information  provided on GERD, diet for GERD & Zantac  Morning sickness - Rx for Reglan 10 mg every 6 hrs - Information provided on morning sickness  Discharge home Patient verbalized an understanding of the plan of care and agrees.   Raelyn Moraolitta Dequon Schnebly, MSN, CNM 03/27/2017, 9:51 PM

## 2017-03-27 NOTE — Discharge Instructions (Signed)
[facility] Providers  ° ° °[facility]       Phone: [phone] ° °[facility] Phone: [phone] ° °[facility] [place]  Phone: [phone] ° °[facility] [place]  Phone: [phone] ° °[facility] [place]  Phone: [phone] ° °[facility]       Phone: [phone] ° °[facility]    Phone: [phone]  ° °[facility]    Phone: [phone] ° °[facility]    Phone: [phone] ° °[facility]    Phone: [phone] ° °[facility]    Phone: [phone] ° °[facility]       Phone: [phone]  ° °[facility]  Phone: [phone] ° °[facility]    Phone: [phone] ° °[facility]   Phone: [phone] ° °[facility]      Phone: [phone] ° °[facility]    Phone: [phone] ° °Safe Medications in Pregnancy  ° °Acne: °Benzoyl Peroxide °Salicylic Acid ° °Backache/Headache: °Tylenol: 2 regular strength every 4 hours OR °             2 Extra strength every 6 hours ° °Colds/Coughs/Allergies: °Benadryl (alcohol free) 25 mg every 6 hours as needed °Breath right strips °Claritin °Cepacol throat lozenges °Chloraseptic throat spray °Cold-Eeze- up to three times per day °Cough drops, alcohol free °Flonase (by prescription only) °Guaifenesin °Mucinex °Robitussin DM (plain only, alcohol free) °Saline nasal spray/drops °Sudafed (pseudoephedrine) & Actifed ** use only after [redacted] weeks gestation and if you do not have high blood pressure °Tylenol °Vicks Vaporub °Zinc lozenges °Zyrtec  ° °Constipation: °Colace °Ducolax suppositories °Fleet enema °Glycerin suppositories °Metamucil °Milk of  magnesia °Miralax °Senokot °Smooth move tea ° °Diarrhea: °Kaopectate °Imodium A-D ° °*NO pepto Bismol ° °Hemorrhoids: °Anusol °Anusol HC °Preparation H °Tucks ° °Indigestion: °Tums °Maalox °Mylanta °Zantac  °Pepcid ° °Insomnia: °Benadryl (alcohol free) 25mg every 6 hours as needed °Tylenol PM °Unisom, no Gelcaps ° °Leg Cramps: °Tums °MagGel ° °Nausea/Vomiting:  °Bonine °Dramamine °Emetrol °Ginger extract °Sea bands °Meclizine  °Nausea medication to take during pregnancy:  °Unisom (doxylamine succinate 25 mg tablets) Take one tablet daily at bedtime. If symptoms are not adequately controlled, the dose can be increased to a maximum recommended dose of two tablets daily (1/2 tablet in the morning, 1/2 tablet mid-afternoon and one at bedtime). °Vitamin B6 100mg tablets. Take one tablet twice a day (up to 200 mg per day). ° °Skin Rashes: °Aveeno products °Benadryl cream or 25mg every 6 hours as needed °Calamine Lotion °1% cortisone cream ° °Yeast infection: °Gyne-lotrimin 7 °Monistat 7 ° ° °**If taking multiple medications, please check labels to avoid duplicating the same active ingredients °**take medication as directed on the label °** Do not exceed 4000 mg of tylenol in 24 hours °**Do not take medications that contain aspirin or ibuprofen ° ° ° ° °

## 2017-03-28 LAB — GC/CHLAMYDIA PROBE AMP (~~LOC~~) NOT AT ARMC
CHLAMYDIA, DNA PROBE: NEGATIVE
NEISSERIA GONORRHEA: NEGATIVE

## 2017-03-28 LAB — HIV ANTIBODY (ROUTINE TESTING W REFLEX): HIV SCREEN 4TH GENERATION: NONREACTIVE

## 2017-05-01 ENCOUNTER — Telehealth: Payer: Self-pay | Admitting: *Deleted

## 2017-05-01 NOTE — Telephone Encounter (Signed)
Pt left message on 3/13 requesting her results from February to be faxed to [facility]. Per chart review, pt was seen @ MAU on 2/11. She has not been seen @ this office however has appt on 4/4 for New Ob. Clarification needed from pt as to where she is planning to receive prenatal care. Pt will need to sign ROI in order to have lab results faxed to another office.

## 2017-05-12 DIAGNOSIS — Z3490 Encounter for supervision of normal pregnancy, unspecified, unspecified trimester: Secondary | ICD-10-CM | POA: Insufficient documentation

## 2017-05-15 NOTE — Telephone Encounter (Signed)
Called patient and she states she already got the results sent when she called back. Patient states she just needed them before her doctor's appt. Asked patient if she was aware she had a new patient appt here this week for prenatal care. Patient states no and would like to cancel appt as she is receiving prenatal care elsewhere. Told patient we will take care of canceling that appt for her. Patient verbalized understanding and had no questions

## 2017-05-18 ENCOUNTER — Encounter: Payer: Medicaid Other | Admitting: Student

## 2017-10-03 LAB — OB RESULTS CONSOLE GBS: STREP GROUP B AG: NEGATIVE

## 2017-10-25 ENCOUNTER — Inpatient Hospital Stay (HOSPITAL_COMMUNITY)
Admission: AD | Admit: 2017-10-25 | Discharge: 2017-10-25 | Disposition: A | Discharge status: Home / Self Care | Payer: Medicaid Other | Source: Ambulatory Visit | Attending: Obstetrics and Gynecology | Admitting: Obstetrics and Gynecology

## 2017-10-25 ENCOUNTER — Encounter (HOSPITAL_COMMUNITY): Payer: Self-pay

## 2017-10-25 DIAGNOSIS — Z3689 Encounter for other specified antenatal screening: Secondary | ICD-10-CM

## 2017-10-25 DIAGNOSIS — Z0371 Encounter for suspected problem with amniotic cavity and membrane ruled out: Secondary | ICD-10-CM

## 2017-10-25 DIAGNOSIS — O479 False labor, unspecified: Secondary | ICD-10-CM

## 2017-10-25 DIAGNOSIS — Z3A39 39 weeks gestation of pregnancy: Secondary | ICD-10-CM

## 2017-10-25 NOTE — MAU Note (Signed)
Pt reports contractions that started last night. Getting more frequent and stronger. Anywhere from 5-8 mins. States she had some bloody mucous come out today. Pt denies LOF. Reports good fetal movement. Cervix has not been examined.

## 2017-10-25 NOTE — MAU Provider Note (Signed)
Per RN, request by Dr. Claiborne Billings for MAU provider to enter discharge order and clinical impression as MD was unavailable due to her being in the operating room. Nurses notes, vital signs, and fetal tracing reviewed. EFM: 140 bpm, moderate variability, + accels, no decels Toco: 2-4. Orders and impression placed.  Sharyon Cable, CNM 10/25/17, 10:58 PM

## 2017-10-25 NOTE — MAU Note (Signed)
I have communicated with Dr. Claiborne Billings and reviewed vital signs:  Vitals:   10/25/17 2157  BP: 124/89  Pulse: 86  Resp: 20  Temp: 98.1 F (36.7 C)  SpO2: 99%    Vaginal exam:  Dilation: 1 Effacement (%): 70, 80 Station: -3 Presentation: Vertex Exam by:: B. Lindee Leason, RN ,   Also reviewed contraction pattern and that non-stress test is reactive.  It has been documented that patient is contracting every 2-4 minutes with minimal cervical dilation not indicating active labor.  Patient denies any other complaints.  Based on this report provider has given order for discharge.  A discharge order and diagnosis entered by a provider.   Labor discharge instructions reviewed with patient.

## 2017-10-26 ENCOUNTER — Other Ambulatory Visit: Payer: Self-pay

## 2017-10-26 ENCOUNTER — Inpatient Hospital Stay (HOSPITAL_COMMUNITY): Payer: Medicaid Other | Admitting: Anesthesiology

## 2017-10-26 ENCOUNTER — Encounter (HOSPITAL_COMMUNITY)
Admission: AD | Disposition: A | Discharge status: Home / Self Care | Payer: Self-pay | Source: Home / Self Care | Attending: Obstetrics

## 2017-10-26 ENCOUNTER — Inpatient Hospital Stay (HOSPITAL_COMMUNITY)
Admission: AD | Admit: 2017-10-26 | Discharge: 2017-10-28 | DRG: 786 | Disposition: A | Discharge status: Home / Self Care | Payer: Medicaid Other | Attending: Obstetrics | Admitting: Obstetrics

## 2017-10-26 ENCOUNTER — Encounter (HOSPITAL_COMMUNITY): Payer: Self-pay | Admitting: *Deleted

## 2017-10-26 DIAGNOSIS — D649 Anemia, unspecified: Secondary | ICD-10-CM | POA: Diagnosis present

## 2017-10-26 DIAGNOSIS — Z3A39 39 weeks gestation of pregnancy: Secondary | ICD-10-CM | POA: Diagnosis not present

## 2017-10-26 DIAGNOSIS — O210 Mild hyperemesis gravidarum: Secondary | ICD-10-CM

## 2017-10-26 DIAGNOSIS — O99214 Obesity complicating childbirth: Secondary | ICD-10-CM | POA: Diagnosis present

## 2017-10-26 DIAGNOSIS — O411230 Chorioamnionitis, third trimester, not applicable or unspecified: Secondary | ICD-10-CM | POA: Diagnosis present

## 2017-10-26 DIAGNOSIS — E669 Obesity, unspecified: Secondary | ICD-10-CM | POA: Diagnosis present

## 2017-10-26 DIAGNOSIS — Z3483 Encounter for supervision of other normal pregnancy, third trimester: Secondary | ICD-10-CM | POA: Diagnosis present

## 2017-10-26 DIAGNOSIS — O9902 Anemia complicating childbirth: Principal | ICD-10-CM | POA: Diagnosis present

## 2017-10-26 LAB — CBC
HCT: 31.6 % — ABNORMAL LOW (ref 36.0–46.0)
HEMATOCRIT: 37 % (ref 36.0–46.0)
HEMOGLOBIN: 12 g/dL (ref 12.0–15.0)
Hemoglobin: 10.2 g/dL — ABNORMAL LOW (ref 12.0–15.0)
MCH: 25.9 pg — ABNORMAL LOW (ref 26.0–34.0)
MCH: 25.7 pg — AB (ref 26.0–34.0)
MCHC: 32.4 g/dL (ref 30.0–36.0)
MCHC: 32.3 g/dL (ref 30.0–36.0)
MCV: 79.9 fL (ref 78.0–100.0)
MCV: 79.6 fL (ref 78.0–100.0)
PLATELETS: 230 10*3/uL (ref 150–400)
Platelets: 297 10*3/uL (ref 150–400)
RBC: 4.63 MIL/uL (ref 3.87–5.11)
RBC: 3.97 MIL/uL (ref 3.87–5.11)
RDW: 16.2 % — AB (ref 11.5–15.5)
RDW: 16.1 % — ABNORMAL HIGH (ref 11.5–15.5)
WBC: 13.1 10*3/uL — ABNORMAL HIGH (ref 4.0–10.5)
WBC: 20 10*3/uL — ABNORMAL HIGH (ref 4.0–10.5)

## 2017-10-26 LAB — RPR: RPR Ser Ql: NONREACTIVE

## 2017-10-26 LAB — TYPE AND SCREEN
ABO/RH(D): O POS
ANTIBODY SCREEN: NEGATIVE

## 2017-10-26 LAB — ABO/RH: ABO/RH(D): O POS

## 2017-10-26 SURGERY — Surgical Case
Anesthesia: Epidural | Site: Abdomen | Wound class: Clean Contaminated

## 2017-10-26 MED ORDER — SCOPOLAMINE 1 MG/3DAYS TD PT72
MEDICATED_PATCH | TRANSDERMAL | Status: AC
  Filled 2017-10-26: qty 1

## 2017-10-26 MED ORDER — PHENYLEPHRINE 40 MCG/ML (10ML) SYRINGE FOR IV PUSH (FOR BLOOD PRESSURE SUPPORT): 80.0000 ug | PREFILLED_SYRINGE | INTRAVENOUS | Status: DC | PRN

## 2017-10-26 MED ORDER — OXYCODONE HCL 5 MG PO TABS: 10.0000 mg | ORAL_TABLET | ORAL | Status: DC | PRN

## 2017-10-26 MED ORDER — OXYTOCIN 40 UNITS IN LACTATED RINGERS INFUSION - SIMPLE MED
1.0000 m[IU]/min | INTRAVENOUS | Status: DC
  Administered 2017-10-26: 2 m[IU]/min via INTRAVENOUS
  Filled 2017-10-26: qty 1000

## 2017-10-26 MED ORDER — PHENYLEPHRINE 40 MCG/ML (10ML) SYRINGE FOR IV PUSH (FOR BLOOD PRESSURE SUPPORT)
PREFILLED_SYRINGE | INTRAVENOUS | Status: AC
  Filled 2017-10-26: qty 10

## 2017-10-26 MED ORDER — MORPHINE SULFATE (PF) 0.5 MG/ML IJ SOLN
INTRAMUSCULAR | Status: DC | PRN
  Administered 2017-10-26: 3 mg via EPIDURAL

## 2017-10-26 MED ORDER — TERBUTALINE SULFATE 1 MG/ML IJ SOLN: 0.2500 mg | Freq: Once | INTRAMUSCULAR | Status: DC | PRN

## 2017-10-26 MED ORDER — DIBUCAINE 1 % RE OINT: 1.0000 "application " | TOPICAL_OINTMENT | RECTAL | Status: DC | PRN

## 2017-10-26 MED ORDER — FENTANYL 2.5 MCG/ML BUPIVACAINE 1/10 % EPIDURAL INFUSION (WH - ANES)
14.0000 mL/h | INTRAMUSCULAR | Status: DC | PRN
  Filled 2017-10-26: qty 100

## 2017-10-26 MED ORDER — COCONUT OIL OIL: 1.0000 "application " | TOPICAL_OIL | Status: DC | PRN

## 2017-10-26 MED ORDER — OXYCODONE HCL 5 MG PO TABS: 5.0000 mg | ORAL_TABLET | ORAL | Status: DC | PRN

## 2017-10-26 MED ORDER — METHYLERGONOVINE MALEATE 0.2 MG/ML IJ SOLN
INTRAMUSCULAR | Status: DC | PRN
  Administered 2017-10-26: 0.2 mg via INTRAMUSCULAR

## 2017-10-26 MED ORDER — SOD CITRATE-CITRIC ACID 500-334 MG/5ML PO SOLN: 30.0000 mL | ORAL | Status: DC | PRN

## 2017-10-26 MED ORDER — METHYLERGONOVINE MALEATE 0.2 MG/ML IJ SOLN
INTRAMUSCULAR | Status: AC
  Filled 2017-10-26: qty 1

## 2017-10-26 MED ORDER — GENTAMICIN SULFATE 40 MG/ML IJ SOLN
5.0000 mg/kg | Freq: Once | INTRAVENOUS | Status: AC
  Administered 2017-10-26: 380 mg via INTRAVENOUS
  Filled 2017-10-26: qty 9.5

## 2017-10-26 MED ORDER — SIMETHICONE 80 MG PO CHEW
80.0000 mg | CHEWABLE_TABLET | ORAL | Status: DC
  Administered 2017-10-26 – 2017-10-28 (×2): 80 mg via ORAL
  Filled 2017-10-26 (×4): qty 1

## 2017-10-26 MED ORDER — FENTANYL CITRATE (PF) 100 MCG/2ML IJ SOLN: 25.0000 ug | INTRAMUSCULAR | Status: DC | PRN

## 2017-10-26 MED ORDER — EPHEDRINE 5 MG/ML INJ: 10.0000 mg | INTRAVENOUS | Status: DC | PRN

## 2017-10-26 MED ORDER — MENTHOL 3 MG MT LOZG: 1.0000 | LOZENGE | OROMUCOSAL | Status: DC | PRN

## 2017-10-26 MED ORDER — WITCH HAZEL-GLYCERIN EX PADS: 1.0000 "application " | MEDICATED_PAD | CUTANEOUS | Status: DC | PRN

## 2017-10-26 MED ORDER — LACTATED RINGERS IV SOLN: INTRAVENOUS | Status: DC

## 2017-10-26 MED ORDER — FENTANYL 2.5 MCG/ML BUPIVACAINE 1/10 % EPIDURAL INFUSION (WH - ANES): 14.0000 mL/h | INTRAMUSCULAR | Status: DC | PRN

## 2017-10-26 MED ORDER — PROMETHAZINE HCL 25 MG/ML IJ SOLN: 6.2500 mg | INTRAMUSCULAR | Status: DC | PRN

## 2017-10-26 MED ORDER — IBUPROFEN 600 MG PO TABS
600.0000 mg | ORAL_TABLET | Freq: Four times a day (QID) | ORAL | Status: DC
  Administered 2017-10-26 – 2017-10-28 (×7): 600 mg via ORAL
  Filled 2017-10-26 (×7): qty 1

## 2017-10-26 MED ORDER — DEXAMETHASONE SODIUM PHOSPHATE 4 MG/ML IJ SOLN
INTRAMUSCULAR | Status: DC | PRN
  Administered 2017-10-26: 4 mg via INTRAVENOUS

## 2017-10-26 MED ORDER — FENTANYL 2.5 MCG/ML BUPIVACAINE 1/10 % EPIDURAL INFUSION (WH - ANES)
INTRAMUSCULAR | Status: DC | PRN
  Administered 2017-10-26: 300 ug via EPIDURAL
  Administered 2017-10-26: 14 mL/h via EPIDURAL

## 2017-10-26 MED ORDER — OXYTOCIN 10 UNIT/ML IJ SOLN
INTRAMUSCULAR | Status: AC
  Filled 2017-10-26: qty 4

## 2017-10-26 MED ORDER — LACTATED RINGERS IV SOLN: 500.0000 mL | INTRAVENOUS | Status: DC | PRN

## 2017-10-26 MED ORDER — ONDANSETRON HCL 4 MG/2ML IJ SOLN
INTRAMUSCULAR | Status: AC
  Filled 2017-10-26: qty 2

## 2017-10-26 MED ORDER — SIMETHICONE 80 MG PO CHEW
80.0000 mg | CHEWABLE_TABLET | Freq: Three times a day (TID) | ORAL | Status: DC
  Administered 2017-10-27 – 2017-10-28 (×4): 80 mg via ORAL
  Filled 2017-10-26 (×8): qty 1

## 2017-10-26 MED ORDER — ACETAMINOPHEN 325 MG PO TABS
650.0000 mg | ORAL_TABLET | ORAL | Status: DC | PRN
  Administered 2017-10-26 – 2017-10-27 (×3): 650 mg via ORAL
  Filled 2017-10-26 (×3): qty 2

## 2017-10-26 MED ORDER — TETANUS-DIPHTH-ACELL PERTUSSIS 5-2.5-18.5 LF-MCG/0.5 IM SUSP: 0.5000 mL | Freq: Once | INTRAMUSCULAR | Status: DC

## 2017-10-26 MED ORDER — ALBUTEROL SULFATE (2.5 MG/3ML) 0.083% IN NEBU: 3.0000 mL | INHALATION_SOLUTION | RESPIRATORY_TRACT | Status: DC | PRN

## 2017-10-26 MED ORDER — LIDOCAINE HCL (PF) 1 % IJ SOLN: 30.0000 mL | INTRAMUSCULAR | Status: DC | PRN

## 2017-10-26 MED ORDER — FLEET ENEMA 7-19 GM/118ML RE ENEM: 1.0000 | ENEMA | RECTAL | Status: DC | PRN

## 2017-10-26 MED ORDER — SCOPOLAMINE 1 MG/3DAYS TD PT72
MEDICATED_PATCH | TRANSDERMAL | Status: DC | PRN
  Administered 2017-10-26: 1 via TRANSDERMAL

## 2017-10-26 MED ORDER — OXYCODONE-ACETAMINOPHEN 5-325 MG PO TABS: 2.0000 | ORAL_TABLET | ORAL | Status: DC | PRN

## 2017-10-26 MED ORDER — OXYCODONE-ACETAMINOPHEN 5-325 MG PO TABS: 1.0000 | ORAL_TABLET | ORAL | Status: DC | PRN

## 2017-10-26 MED ORDER — OXYTOCIN 10 UNIT/ML IJ SOLN
INTRAVENOUS | Status: DC | PRN
  Administered 2017-10-26: 40 [IU] via INTRAVENOUS

## 2017-10-26 MED ORDER — OXYTOCIN BOLUS FROM INFUSION: 500.0000 mL | Freq: Once | INTRAVENOUS | Status: DC

## 2017-10-26 MED ORDER — DIPHENHYDRAMINE HCL 50 MG/ML IJ SOLN: 12.5000 mg | INTRAMUSCULAR | Status: DC | PRN

## 2017-10-26 MED ORDER — LIDOCAINE-EPINEPHRINE 2 %-1:100000 IJ SOLN
INTRAMUSCULAR | Status: DC | PRN
  Administered 2017-10-26 (×2): 5 mL via INTRADERMAL

## 2017-10-26 MED ORDER — SIMETHICONE 80 MG PO CHEW
80.0000 mg | CHEWABLE_TABLET | ORAL | Status: DC | PRN
  Filled 2017-10-26: qty 1

## 2017-10-26 MED ORDER — FENTANYL 2.5 MCG/ML BUPIVACAINE 1/10 % EPIDURAL INFUSION (WH - ANES)
INTRAMUSCULAR | Status: AC
  Filled 2017-10-26: qty 100

## 2017-10-26 MED ORDER — SENNOSIDES-DOCUSATE SODIUM 8.6-50 MG PO TABS
2.0000 | ORAL_TABLET | ORAL | Status: DC
  Administered 2017-10-26 – 2017-10-28 (×2): 2 via ORAL
  Filled 2017-10-26 (×4): qty 2

## 2017-10-26 MED ORDER — SODIUM CHLORIDE 0.9 % IR SOLN
Status: DC | PRN
  Administered 2017-10-26: 1000 mL

## 2017-10-26 MED ORDER — CEFAZOLIN SODIUM-DEXTROSE 2-4 GM/100ML-% IV SOLN
2.0000 g | Freq: Once | INTRAVENOUS | Status: AC
  Administered 2017-10-26: 2 g via INTRAVENOUS
  Filled 2017-10-26: qty 100

## 2017-10-26 MED ORDER — LACTATED RINGERS IV SOLN
INTRAVENOUS | Status: DC
  Administered 2017-10-26 (×3): via INTRAVENOUS

## 2017-10-26 MED ORDER — LIDOCAINE-EPINEPHRINE (PF) 2 %-1:200000 IJ SOLN
INTRAMUSCULAR | Status: AC
  Filled 2017-10-26: qty 20

## 2017-10-26 MED ORDER — ACETAMINOPHEN 500 MG PO TABS
1000.0000 mg | ORAL_TABLET | Freq: Once | ORAL | Status: AC
  Administered 2017-10-26: 1000 mg via ORAL
  Filled 2017-10-26: qty 2

## 2017-10-26 MED ORDER — CEFAZOLIN SODIUM-DEXTROSE 2-4 GM/100ML-% IV SOLN
INTRAVENOUS | Status: AC
  Filled 2017-10-26: qty 100

## 2017-10-26 MED ORDER — LACTATED RINGERS IV SOLN
INTRAVENOUS | Status: DC | PRN
  Administered 2017-10-26: 16:00:00 via INTRAVENOUS

## 2017-10-26 MED ORDER — DIPHENHYDRAMINE HCL 25 MG PO CAPS
25.0000 mg | ORAL_CAPSULE | Freq: Four times a day (QID) | ORAL | Status: DC | PRN
  Administered 2017-10-27 (×2): 25 mg via ORAL
  Filled 2017-10-26 (×3): qty 1

## 2017-10-26 MED ORDER — LACTATED RINGERS AMNIOINFUSION
INTRAVENOUS | Status: DC
  Administered 2017-10-26: 12:00:00 via INTRAUTERINE

## 2017-10-26 MED ORDER — MORPHINE SULFATE (PF) 0.5 MG/ML IJ SOLN
INTRAMUSCULAR | Status: AC
  Filled 2017-10-26: qty 10

## 2017-10-26 MED ORDER — OXYTOCIN 40 UNITS IN LACTATED RINGERS INFUSION - SIMPLE MED: 2.5000 [IU]/h | INTRAVENOUS | Status: DC

## 2017-10-26 MED ORDER — SODIUM CHLORIDE 0.9 % IV SOLN
2.0000 g | Freq: Once | INTRAVENOUS | Status: AC
  Administered 2017-10-26: 2 g via INTRAVENOUS
  Filled 2017-10-26: qty 2000

## 2017-10-26 MED ORDER — LACTATED RINGERS IV SOLN
INTRAVENOUS | Status: DC | PRN
  Administered 2017-10-26 (×3): via INTRAVENOUS

## 2017-10-26 MED ORDER — OXYTOCIN 40 UNITS IN LACTATED RINGERS INFUSION - SIMPLE MED: 2.5000 [IU]/h | INTRAVENOUS | Status: AC

## 2017-10-26 MED ORDER — ACETAMINOPHEN 325 MG PO TABS: 650.0000 mg | ORAL_TABLET | ORAL | Status: DC | PRN

## 2017-10-26 MED ORDER — LACTATED RINGERS IV SOLN: 500.0000 mL | Freq: Once | INTRAVENOUS | Status: DC

## 2017-10-26 MED ORDER — PRENATAL MULTIVITAMIN CH
1.0000 | ORAL_TABLET | Freq: Every day | ORAL | Status: DC
  Administered 2017-10-27 – 2017-10-28 (×2): 1 via ORAL
  Filled 2017-10-26 (×2): qty 1

## 2017-10-26 MED ORDER — ONDANSETRON HCL 4 MG/2ML IJ SOLN
INTRAMUSCULAR | Status: DC | PRN
  Administered 2017-10-26: 4 mg via INTRAVENOUS

## 2017-10-26 MED ORDER — MEPERIDINE HCL 25 MG/ML IJ SOLN: 6.2500 mg | INTRAMUSCULAR | Status: DC | PRN

## 2017-10-26 MED ORDER — ONDANSETRON HCL 4 MG/2ML IJ SOLN: 4.0000 mg | Freq: Four times a day (QID) | INTRAMUSCULAR | Status: DC | PRN

## 2017-10-26 MED ORDER — LIDOCAINE HCL (PF) 1 % IJ SOLN
INTRAMUSCULAR | Status: DC | PRN
  Administered 2017-10-26: 13 mL via EPIDURAL

## 2017-10-26 SURGICAL SUPPLY — 37 items
BENZOIN TINCTURE PRP APPL 2/3 (GAUZE/BANDAGES/DRESSINGS) ×2 IMPLANT
CHLORAPREP W/TINT 26ML (MISCELLANEOUS) ×2 IMPLANT
CLAMP CORD UMBIL (MISCELLANEOUS) IMPLANT
CLOTH BEACON ORANGE TIMEOUT ST (SAFETY) ×2 IMPLANT
CLSR STERI-STRIP ANTIMIC 1/2X4 (GAUZE/BANDAGES/DRESSINGS) ×2 IMPLANT
DRSG OPSITE POSTOP 4X10 (GAUZE/BANDAGES/DRESSINGS) ×2 IMPLANT
ELECT REM PT RETURN 9FT ADLT (ELECTROSURGICAL) ×2
ELECTRODE REM PT RTRN 9FT ADLT (ELECTROSURGICAL) ×1 IMPLANT
EXTRACTOR VACUUM KIWI (MISCELLANEOUS) IMPLANT
GLOVE BIOGEL PI IND STRL 6.5 (GLOVE) ×1 IMPLANT
GLOVE BIOGEL PI IND STRL 7.0 (GLOVE) ×1 IMPLANT
GLOVE BIOGEL PI INDICATOR 6.5 (GLOVE) ×1
GLOVE BIOGEL PI INDICATOR 7.0 (GLOVE) ×1
GLOVE ECLIPSE 6.0 STRL STRAW (GLOVE) ×2 IMPLANT
GOWN STRL REUS W/TWL LRG LVL3 (GOWN DISPOSABLE) ×4 IMPLANT
KIT ABG SYR 3ML LUER SLIP (SYRINGE) IMPLANT
NEEDLE HYPO 25X5/8 SAFETYGLIDE (NEEDLE) IMPLANT
NS IRRIG 1000ML POUR BTL (IV SOLUTION) ×2 IMPLANT
PACK C SECTION WH (CUSTOM PROCEDURE TRAY) ×2 IMPLANT
PAD OB MATERNITY 4.3X12.25 (PERSONAL CARE ITEMS) ×2 IMPLANT
PENCIL SMOKE EVAC W/HOLSTER (ELECTROSURGICAL) ×2 IMPLANT
RTRCTR C-SECT PINK 25CM LRG (MISCELLANEOUS) ×2 IMPLANT
STRIP CLOSURE SKIN 1/2X4 (GAUZE/BANDAGES/DRESSINGS) ×2 IMPLANT
SUT MNCRL 0 VIOLET CTX 36 (SUTURE) ×2 IMPLANT
SUT MNCRL AB 3-0 PS2 27 (SUTURE) ×2 IMPLANT
SUT MON AB 2-0 SH 27 (SUTURE) ×1
SUT MON AB 2-0 SH27 (SUTURE) ×1 IMPLANT
SUT MONOCRYL 0 CTX 36 (SUTURE) ×2
SUT PLAIN 0 NONE (SUTURE) IMPLANT
SUT PLAIN 2 0 (SUTURE) ×1
SUT PLAIN ABS 2-0 CT1 27XMFL (SUTURE) ×1 IMPLANT
SUT VIC AB 0 CTX 36 (SUTURE) ×2
SUT VIC AB 0 CTX36XBRD ANBCTRL (SUTURE) ×2 IMPLANT
SUT VIC AB 2-0 CT1 27 (SUTURE) ×1
SUT VIC AB 2-0 CT1 TAPERPNT 27 (SUTURE) ×1 IMPLANT
TOWEL OR 17X24 6PK STRL BLUE (TOWEL DISPOSABLE) ×2 IMPLANT
TRAY FOLEY W/BAG SLVR 14FR LF (SET/KITS/TRAYS/PACK) ×2 IMPLANT

## 2017-10-26 NOTE — MAU Note (Signed)
Pt presents to MAU c/o ctx every 1-124min. Pt reports the pain is 9/10. No LOF. Pt reports bloody show. +FM.

## 2017-10-26 NOTE — H&P (Signed)
27 y.o. G2P0010 @ 6361w2d presents with labor.  Otherwise has good fetal movement and no bleeding.  Pregnancy c/b: 1. Maternal obesity. 23 Starting BMI 45.  Growth US 8/5--EFW 54% (4lb 15oz) .    Past Medical History:  Diagnosis Date  . Anemia    as a child  . Asthma    as a child  . Chlamydia   . GERD (gastroesophageal reflux disease)   . Headache(784.0)    migranes  . Seasonal allergies   . Trichomonas infection     Past Surgical History:  Procedure Laterality Date  . NO PAST SURGERIES      OB History  Gravida Para Term Preterm AB Living  2       1 0  SAB TAB Ectopic Multiple Live Births  1            # Outcome Date GA Lbr Len/2nd Weight Sex Delivery Anes PTL Lv  2 Current           1 SAB             Social History   Socioeconomic History  . Marital status: Single    Spouse name: Not on file  . Number of children: 0   . Years of education: some colle  . Highest education level: Not on file  Occupational History  . Occupation: does many duties     Comment: [employer]   Tobacco Use  . Smoking status: Never Smoker  . Smokeless tobacco: Never Used  Substance and Sexual Activity  . Alcohol use: Not Currently    Alcohol/week: 19.0 standard drinks    Types: 5 Glasses of wine, 14 Shots of liquor per week  . Drug use: Not Currently    Frequency: 7.0 times per week    Types: Marijuana    Comment: last use in Jan 2019  . Sexual activity: Yes   Shellfish allergy    Prenatal Transfer Tool  Maternal Diabetes: No Genetic Screening: Normal low risk NIPT Maternal Ultrasounds/Referrals: Normal Fetal Ultrasounds or other Referrals:  None Maternal Substance Abuse:  No Significant Maternal Medications:  None Significant Maternal Lab Results: Lab values include: Group B Strep negative  ABO, Rh: --/--/O POS, O POS Performed at [facility], [address] [place], [place] [address]  [phone](09/12 0548) Antibody: NEG (09/12 0548) Rubella:  Immune RPR:    Nr HBsAg:   NR HIV: Non Reactive (02/11 2154)  GBS: Negative (08/20 0000)     Vitals:   10/26/17 0830 10/26/17 0900  BP: 112/82 125/79  Pulse: 82 88  Resp: 16 16  Temp:  99.1 F (37.3 C)     General:  NAD Abdomen:  soft, gravid, EFW 7.5# SVE:  6/100/-2, AROM clear fluid FHTs:  145, mod var, occ early decels. +scalp stim.   Toco:  Not tracing well, IUPC placed  A/P   27 y.o. G2P0010 6761w2d presents with labor Cervical change, continue expectant mangement Pitocin prn FSR/ vtx/ GBS neg  Makailey Hodgkin GEFFEL Silvia Markuson

## 2017-10-26 NOTE — Op Note (Signed)
PRE-OPERATIVE DIAGNOSIS:  nonreassuring FHR    POST-OPERATIVE DIAGNOSIS:  nonreassuring FHR       PROCEDURE:  Procedure(s): CESAREAN SECTION (N/A)  SURGEON:  Surgeon(s) and Role:    Marlow Baars* Rodrickus Min, MD - Primary  ASSISTANTS: Luiz BlareHeather Krietmeyer, RNFA  ANESTHESIA:   epidural  EBL:  1493 mL   SPECIMEN: placenta to pathology  FINDINGS: Normal uterus, tubes and ovaries bilaterally.  Viable female infant, 542645g (5lb 13oz) Apgars 6, 8.    PROCEDURE:   After epidural anesthesia was found to adequate , the patient was placed in the dorsal supine position with a leftward tilt, prepped and draped in the usual sterile manner. A Pfannenstiel incision was made and carried down through the subcutaneous tissue to the fascia.  The fascia was incised in the midline and the fascial incision was extended laterally with Mayo scissors. The superior aspect of the fascial incision was grasped with two Kocher clamp, tented up and the rectus muscles dissected off sharply. The rectus was then dissected off with blunt dissection and Mayo scissors inferiorly. The rectus muscles were separated in the midline. The abdominal peritoneum was identified, tented up, entered bluntly, and the incision was extended superiorly and inferiorly with good visualization of the bladder. The Alexis retractor was deployed. The vesicouterine peritoneum was identified and was well below the lower uterine segment. A scalpel was then used to make a low transverse incision on the uterus which was extended in the cephalad-caudad direction with blunt dissection. The fluid was clear. The fetal vertex was identified, elevated out of the pelvis and brought to the hysterotomy.  The head was delivered easily followed by the shoulders and body.  After a 30 second delay, the cord was clamped and cut and the infant was passed to the waiting neonatologist.  The placenta was then delivered spontaneously, intact and appear normal, the uterus was cleared of all  clot and debris.     The hysterotomy was repaired with #0 Monocryl in running locked fashion.  A second imbricating layer of #0 Monocryl was placed.  A figure of eight suture was placed at the left aspect of the hysterotomy and excellent hemostasis was noted.  There was significant uterine atony noted at the start of the hysterotomy repair with brisk uterine bleeding.  The standard IV bolus of pitocin was started and 0.2mg  IM methergine was given with improvement in uterine tone. The Alexis retractor was removed from the abdomen. The peritoneum was examined and all vessels noted to be hemostatic. The abdominal cavity was cleared of all clot and debris.  The peritoneum was closed with 2-0 vicryl in a running fashion. The fascia and rectus muscles were inspected and were hemostatic. The fascia was closed with 0 Vicryl in a running fashion. The subcutaneous layer was irrigated and all bleeders cauterized. The subcutaneous layer was closed with interrupted plain gut. The skin was closed with 3-0 monocryl in a subcuticular fashion. The incision was dressed with benzoine, steri strips and honeycomb dressing. All sponge lap and needle counts were correct x3. Patient tolerated the procedure well and recovered in stable condition following the procedure.

## 2017-10-26 NOTE — Brief Op Note (Signed)
10/26/2017  5:17 PM  PATIENT:  Lindsay Perez  27 y.o. female  PRE-OPERATIVE DIAGNOSIS:  nonreassuring FHR    POST-OPERATIVE DIAGNOSIS:  nonreassuring FHR       PROCEDURE:  Procedure(s): CESAREAN SECTION (N/A)  SURGEON:  Surgeon(s) and Role:    Marlow Baars* Ammara Raj, MD - Primary  ASSISTANTS: Luiz BlareHeather Krietmeyer, RNFA  ANESTHESIA:   epidural  EBL:  1493 mL   BLOOD ADMINISTERED:none  DRAINS: none   LOCAL MEDICATIONS USED:  NONE  SPECIMEN:  Source of Specimen:  placenta  DISPOSITION OF SPECIMEN:  PATHOLOGY  COUNTS:  YES  TOURNIQUET:  * No tourniquets in log *  DICTATION: .Note written in EPIC  PLAN OF CARE: Admit to inpatient   PATIENT DISPOSITION:  PACU - hemodynamically stable.   Delay start of Pharmacological VTE agent (>24hrs) due to surgical blood loss or risk of bleeding: not applicable

## 2017-10-26 NOTE — Anesthesia Postprocedure Evaluation (Signed)
Anesthesia Post Note  Patient: Lindsay RainwaterChelsea Masley  Procedure(s) Performed: CESAREAN SECTION (N/A Abdomen)     Patient location during evaluation: PACU Anesthesia Type: Epidural Level of consciousness: oriented and awake and alert Pain management: pain level controlled Vital Signs Assessment: post-procedure vital signs reviewed and stable Respiratory status: spontaneous breathing, respiratory function stable and patient connected to nasal cannula oxygen Cardiovascular status: blood pressure returned to baseline and stable Postop Assessment: no headache, no backache, no apparent nausea or vomiting, epidural receding and patient able to bend at knees Anesthetic complications: no    Last Vitals:  Vitals:   10/26/17 1716 10/26/17 1815  BP:  120/63  Pulse: 88 98  Resp: (!) 29 (!) 26  Temp:  37.1 C  SpO2: 99% 99%    Last Pain:  Vitals:   10/26/17 1815  TempSrc: Oral  PainSc: 0-No pain                 Shelton SilvasKevin D Kadin Bera

## 2017-10-26 NOTE — Progress Notes (Signed)
Pt had epidural placed at 0652. RN remained at bedside and took BPs q585minutes for 30 minutes. Monitor in room failed to transfer values into EMR, all values were WNL and pt resting comfortably with epidural.

## 2017-10-26 NOTE — Anesthesia Procedure Notes (Signed)
Epidural Patient location during procedure: OB Start time: 10/26/2017 6:46 AM End time: 10/26/2017 6:58 AM  Staffing Anesthesiologist: Lowella CurbMiller, Derk Doubek Ray, MD Performed: anesthesiologist   Preanesthetic Checklist Completed: patient identified, site marked, surgical consent, pre-op evaluation, timeout performed, IV checked, risks and benefits discussed and monitors and equipment checked  Epidural Patient position: sitting Prep: ChloraPrep Patient monitoring: heart rate, cardiac monitor, continuous pulse ox and blood pressure Approach: midline Location: L2-L3 Injection technique: LOR saline  Needle:  Needle type: Tuohy  Needle gauge: 17 G Needle length: 9 cm Needle insertion depth: 7 cm Catheter type: closed end flexible Catheter size: 20 Guage Catheter at skin depth: 12 cm Test dose: negative  Assessment Events: blood not aspirated, injection not painful, no injection resistance, negative IV test and no paresthesia  Additional Notes Reason for block:procedure for pain

## 2017-10-26 NOTE — Anesthesia Preprocedure Evaluation (Signed)
Anesthesia Evaluation  Patient identified by MRN, date of birth, ID band Patient awake    Reviewed: Allergy & Precautions, NPO status , Patient's Chart, lab work & pertinent test results  Airway Mallampati: II  TM Distance: >3 FB Neck ROM: Full    Dental no notable dental hx.    Pulmonary neg pulmonary ROS,    Pulmonary exam normal breath sounds clear to auscultation       Cardiovascular negative cardio ROS Normal cardiovascular exam Rhythm:Regular Rate:Normal     Neuro/Psych negative neurological ROS  negative psych ROS   GI/Hepatic negative GI ROS, Neg liver ROS,   Endo/Other  negative endocrine ROS  Renal/GU negative Renal ROS  negative genitourinary   Musculoskeletal negative musculoskeletal ROS (+)   Abdominal   Peds negative pediatric ROS (+)  Hematology negative hematology ROS (+)   Anesthesia Other Findings   Reproductive/Obstetrics negative OB ROS (+) Pregnancy                             Anesthesia Physical Anesthesia Plan  ASA: III  Anesthesia Plan: Epidural   Post-op Pain Management:    Induction:   PONV Risk Score and Plan:   Airway Management Planned:   Additional Equipment:   Intra-op Plan:   Post-operative Plan:   Informed Consent:   Plan Discussed with:   Anesthesia Plan Comments:         Anesthesia Quick Evaluation

## 2017-10-26 NOTE — Anesthesia Pain Management Evaluation Note (Signed)
  CRNA Pain Management Visit Note  Patient: Lindsay RainwaterChelsea Winiecki, 27 y.o., female  "Hello I am a member of the anesthesia team at [facility]. We have an anesthesia team available at all times to provide care throughout the hospital, including epidural management and anesthesia for C-section. I don't know your plan for the delivery whether it a natural birth, water birth, IV sedation, nitrous supplementation, doula or epidural, but we want to meet your pain goals."   1.Was your pain managed to your expectations on prior hospitalizations?   No prior hospitalizations  2.What is your expectation for pain management during this hospitalization?     Epidural  3.How can we help you reach that goal? Epidural intact and working well  Record the patient's initial score and the patient's pain goal.   Pain: 0  Pain Goal: 5 [facility] wants you to be able to say your pain was always managed very well.  Edison PaceWILKERSON,Nasteho Glantz 10/26/2017

## 2017-10-26 NOTE — Progress Notes (Signed)
Patient seen and examined.  MVUs remain adequate on 2mU of pitocin.  SVE unchanged over past 5 hours at 6/100/-2, caput developing.  Despite multiple position changes and amnioinfusion, she continues to have a category 2 strip with repetitive variable decelerations.  Over the last hour, variability is now minimal.  She also now has an axillary temperature of 101.45F.  At this time, given persistent category 2 strip remote from delivery with possible arrest of dilation, I recommend proceeding to the OR for cesarean section.  We discussed the risks of c/s to include infection, bleeding, damage to surrounding structures (including but not limited to bowel, bladder, tubes, ovaries, nerves, vessels), vte, need for additional procedures, need for blood transfusion.  Consent signed after all questions answered.  Will give 1gm tylenol now, 2gm IV ancef periop.  Will give additional dose of antibiotic post op given likely developing chorioamnionitis.

## 2017-10-26 NOTE — Transfer of Care (Signed)
Immediate Anesthesia Transfer of Care Note  Patient: Lynda RainwaterChelsea Budney  Procedure(s) Performed: CESAREAN SECTION (N/A Abdomen)  Patient Location: PACU  Anesthesia Type:Epidural  Level of Consciousness: awake, alert  and patient cooperative  Airway & Oxygen Therapy: Patient Spontanous Breathing  Post-op Assessment: Report given to RN and Post -op Vital signs reviewed and stable  Post vital signs: Reviewed and stable  Last Vitals:  Vitals Value Taken Time  BP 131/66 10/26/2017  5:05 PM  Temp    Pulse 93 10/26/2017  5:10 PM  Resp 22 10/26/2017  5:10 PM  SpO2 100 % 10/26/2017  5:10 PM  Vitals shown include unvalidated device data.  Last Pain:  Vitals:   10/26/17 1509  TempSrc: Axillary  PainSc:       Patients Stated Pain Goal: 0 (10/26/17 0600)  Complications: No apparent anesthesia complications

## 2017-10-26 NOTE — Progress Notes (Signed)
OBIX FHR monitors down, paper started. RN at bedside monitoring.

## 2017-10-27 ENCOUNTER — Encounter (HOSPITAL_COMMUNITY): Payer: Self-pay | Admitting: Obstetrics

## 2017-10-27 LAB — CBC
HEMATOCRIT: 26.5 % — AB (ref 36.0–46.0)
HEMOGLOBIN: 8.8 g/dL — AB (ref 12.0–15.0)
MCH: 26.5 pg (ref 26.0–34.0)
MCHC: 33.2 g/dL (ref 30.0–36.0)
MCV: 79.8 fL (ref 78.0–100.0)
Platelets: 216 10*3/uL (ref 150–400)
RBC: 3.32 MIL/uL — AB (ref 3.87–5.11)
RDW: 16.4 % — ABNORMAL HIGH (ref 11.5–15.5)
WBC: 17.9 10*3/uL — ABNORMAL HIGH (ref 4.0–10.5)

## 2017-10-27 MED ORDER — OXYCODONE-ACETAMINOPHEN 5-325 MG PO TABS: 1.0000 | ORAL_TABLET | ORAL | 0 refills | Status: DC | PRN

## 2017-10-27 MED ORDER — FERROUS SULFATE 325 (65 FE) MG PO TABS
325.0000 mg | ORAL_TABLET | Freq: Two times a day (BID) | ORAL | Status: DC
  Administered 2017-10-27 – 2017-10-28 (×3): 325 mg via ORAL
  Filled 2017-10-27 (×3): qty 1

## 2017-10-27 NOTE — Lactation Note (Signed)
This note was copied from a baby's chart. Lactation Consultation Note  Patient Name: Lindsay Lynda RainwaterChelsea Perez ZOXWR'UToday's Date: 10/27/2017 Reason for consult: Initial assessment;Primapara;1st time breastfeeding;Term;Infant < 6lbs  Visited with P1 Mom of term baby at 10917 hrs old, baby <6 lbs.  Baby has breastfed 4 times already, Mom reports a deep latch and no discomfort or pinching.  Baby sleeping swaddled in crib currently. Reviewed and demonstrated breast massage and hand expression, colostrum easily expressed.  Encouraged hand expression in addition to breastfeeding. Set up DEBP at bedside.  Briefly explained plan, and why I recommended this.  Mom being helped up to bathroom for foley catheter removal and to take shower.  RN aware of plan which was written on dry erase board.  Plan- 1- Breastfeed STS at least every 3 hrs, waking as needed. 2- Pump both breasts for 15 mins on initiation setting, adding breast massage and hand expression pre and post pumping. 3- Feed baby any EBM expressed by spoon.  To ask her RN for assistance with this.  Lactation brochure left with Mom.  Mom aware of IP and OP lactation support available to her.   Mom encouraged to call for latch assist or pumping/spoon feeding.  [facility]  Mom does not have a double pump at home.  Maternal Data Formula Feeding for Exclusion: No Has patient been taught Hand Expression?: Yes Does the patient have breastfeeding experience prior to this delivery?: No  Interventions Interventions: Breast feeding basics reviewed;Skin to skin;Breast massage;Hand express;DEBP;Expressed milk  Lactation Tools Discussed/Used WIC Program: Yes Pump Review: Setup, frequency, and cleaning;Milk Storage Initiated by:: Erby Pian Alfa Leibensperger RN IBCLC Date initiated:: 10/27/17   Consult Status Consult Status: Follow-up Date: 10/28/17 Follow-up type: In-patient    Judee ClaraSmith, Latanga Nedrow E 10/27/2017, 9:12 AM

## 2017-10-27 NOTE — Discharge Summary (Signed)
Obstetric Discharge Summary Reason for Admission: onset of labor Prenatal Procedures: NST Intrapartum Procedures: cesarean: low cervical, transverse Postpartum Procedures: none Complications-Operative and Postpartum: hemorrhage Hemoglobin  Date Value Ref Range Status  10/27/2017 8.8 (L) 12.0 - 15.0 g/dL Final   HCT  Date Value Ref Range Status  10/27/2017 26.5 (L) 36.0 - 46.0 % Final   Discharge Diagnoses: Term Pregnancy-delivered  Discharge Information: Date: 10/27/2017 Activity: pelvic rest Diet: routine Medications: Colace, Iron and Percocet Condition: stable Instructions: refer to practice specific booklet Discharge to: home Follow-up Information    Marlow Baarslark, Dyanna, MD Follow up in 4 week(s).   Specialty:  Obstetrics Contact information: [address] [place] [address] [phone]           Newborn Data: Live born female  Birth Weight: 5 lb 13.3 oz (2645 g) APGAR: 6, 8  Newborn Delivery   Birth date/time:  10/26/2017 16:13:00 Delivery type:  C-Section, Low Transverse Trial of labor:  Yes C-section categorization:  Primary     Home with mother.  Marketa Midkiff A 10/27/2017, 3:48 PM

## 2017-10-27 NOTE — Anesthesia Postprocedure Evaluation (Signed)
Anesthesia Post Note  Patient: Lindsay RainwaterChelsea Gammon  Procedure(s) Performed: CESAREAN SECTION (N/A Abdomen)     Patient location during evaluation: Mother Baby Anesthesia Type: Epidural Level of consciousness: awake and alert Pain management: pain level controlled Vital Signs Assessment: post-procedure vital signs reviewed and stable Respiratory status: spontaneous breathing, nonlabored ventilation and respiratory function stable Cardiovascular status: stable Postop Assessment: no headache, no backache, epidural receding and patient able to bend at knees Anesthetic complications: no    Last Vitals:  Vitals:   10/27/17 0300 10/27/17 0620  BP:  115/66  Pulse:  67  Resp:  16  Temp: (!) 31 C 36.8 C  SpO2: 97% 99%    Last Pain:  Vitals:   10/27/17 0620  TempSrc: Oral  PainSc:    Pain Goal: Patients Stated Pain Goal: 0 (10/26/17 0600)               Rica RecordsICKELTON,Dantrell Schertzer

## 2017-10-27 NOTE — Addendum Note (Signed)
Addendum  created 10/27/17 0744 by Rica Recordsickelton, Rosielee Corporan, CRNA   Sign clinical note

## 2017-10-27 NOTE — Progress Notes (Signed)
  Patient is eating, ambulating, voiding.  Pain control is good.  Vitals:   10/27/17 0133 10/27/17 0136 10/27/17 0300 10/27/17 0620  BP: 125/72 138/69  115/66  Pulse: 89 (!) 102  67  Resp:    16  Temp:   (!) 87.8 F (31 C) 98.2 F (36.8 C)  TempSrc:    Oral  SpO2: 98%  97% 99%  Weight:      Height:        lungs:   clear to auscultation cor:    RRR Abdomen:  soft, appropriate tenderness, incisions intact and without erythema or exudate ex:    no cords   Lab Results  Component Value Date   WBC 17.9 (H) 10/27/2017   HGB 8.8 (L) 10/27/2017   HCT 26.5 (L) 10/27/2017   MCV 79.8 10/27/2017   PLT 216 10/27/2017    --/--/O POS, O POS Performed at [facility], [address] [place], [place] [address]  (09/12 0548)/[place]  A/P    Post operative day 1.  Routine post op and postpartum care.  Expect d/c tomorrow.  Percocet for pain control. PP hemorrhage- pt stable, iron.

## 2017-10-28 NOTE — Progress Notes (Addendum)
  Patient is eating, ambulating, voiding.  Pain control is good.  Vitals:   10/27/17 1015 10/27/17 1421 10/27/17 1816 10/28/17 0616  BP: 124/68 132/74 122/67 126/78  Pulse: 74 72 89 73  Resp: 18 18 20 18   Temp: 98.4 F (36.9 C) 98.5 F (36.9 C) 98.7 F (37.1 C) 98.1 F (36.7 C)  TempSrc: Oral Oral Oral   SpO2: 100% 99%    Weight:      Height:        lungs:   clear to auscultation cor:    RRR Abdomen:  soft, appropriate tenderness, incisions intact and without erythema or exudate ex:    no cords   Lab Results  Component Value Date   WBC 17.9 (H) 10/27/2017   HGB 8.8 (L) 10/27/2017   HCT 26.5 (L) 10/27/2017   MCV 79.8 10/27/2017   PLT 216 10/27/2017    --/--/O POS, O POS Performed at [facility], [address] [place], [place] [address]  (09/12 0548)/[place]  A/P    Post operative day 2.  Routine post op and postpartum care.  Expect d/c today.  Percocet for pain control. Iron, stable anemia.

## 2017-10-29 ENCOUNTER — Ambulatory Visit: Payer: Self-pay

## 2017-10-29 NOTE — Lactation Note (Addendum)
This note was copied from a baby's chart. Lactation Consultation Note  Patient Name: Lindsay Perez Today's Date: 10/29/2017   During my initial visit today with this dyad, Lindsay Perez demonstrated being able to identify swallows at breast (also heard by this LC). At my subsequent visit, there was no evidence of milk transfer  (absence of swallows verified by cervical auscultation), despite breast compression, etc.   Lindsay Perez did not follow the feeding plan mentioned by the previous LC (in regards to pumping & feeding infant any EBM). Lindsay Perez has only pumped 3 times since birth. Lindsay Perez denies any breast changes with pregnancy, but says her breasts feel heavier today. Lindsay Perez had a PPH of 1500 mLs.  In light of concern about delayed lactogenesis II/adequate transfer, I instructed Lindsay Perez to feed baby at breast, while paying attention to swallows. If there are no swallows or an infrequent number of swallows at breast, Lindsay Perez is to pump & feed infant EBM. If infant still seems hungry, parents are to give formula via bottle until infant is satiated. Lindsay Perez instructed to continue using formula for supplementation until Lindsay Perez's milk has come to volume or until infant has regular, frequent swallows at breast.   Cup feeding was attempted with this infant, but I had concerns that parents could replicate this. When cup feeding, a divet was noted at the tip of infant's tongue & lateralization was restricted. When tongue was elevated, the base of the tongue was suggestive of posterior restriction. A higher palate was also noticed.   Lindsay Perez was shown how to assemble & use hand pump that was included in pump kit (double- & single-mode).   ,  Lindsay Perez 10/29/2017, 11:21 AM    

## 2018-08-07 ENCOUNTER — Other Ambulatory Visit: Payer: Self-pay

## 2018-08-07 ENCOUNTER — Encounter (HOSPITAL_COMMUNITY): Payer: Self-pay | Admitting: *Deleted

## 2018-08-07 ENCOUNTER — Emergency Department (HOSPITAL_COMMUNITY)
Admission: EM | Admit: 2018-08-07 | Discharge: 2018-08-07 | Disposition: A | Discharge status: Home / Self Care | Payer: Medicaid Other | Attending: Emergency Medicine | Admitting: Emergency Medicine

## 2018-08-07 DIAGNOSIS — L539 Erythematous condition, unspecified: Secondary | ICD-10-CM | POA: Insufficient documentation

## 2018-08-07 DIAGNOSIS — Y999 Unspecified external cause status: Secondary | ICD-10-CM | POA: Insufficient documentation

## 2018-08-07 DIAGNOSIS — Y939 Activity, unspecified: Secondary | ICD-10-CM | POA: Diagnosis not present

## 2018-08-07 DIAGNOSIS — Y929 Unspecified place or not applicable: Secondary | ICD-10-CM | POA: Diagnosis not present

## 2018-08-07 DIAGNOSIS — T7840XA Allergy, unspecified, initial encounter: Secondary | ICD-10-CM

## 2018-08-07 DIAGNOSIS — S70361A Insect bite (nonvenomous), right thigh, initial encounter: Secondary | ICD-10-CM

## 2018-08-07 DIAGNOSIS — W57XXXA Bitten or stung by nonvenomous insect and other nonvenomous arthropods, initial encounter: Secondary | ICD-10-CM | POA: Diagnosis not present

## 2018-08-07 MED ORDER — CEPHALEXIN 500 MG PO CAPS: 500.0000 mg | ORAL_CAPSULE | Freq: Two times a day (BID) | ORAL | 0 refills | Status: DC

## 2018-08-07 MED ORDER — CETIRIZINE HCL 10 MG PO TABS: 10.0000 mg | ORAL_TABLET | Freq: Every day | ORAL | 1 refills | Status: DC

## 2018-08-07 NOTE — ED Triage Notes (Signed)
Pt reports 2-3 days of inner left leg redness and blisters, thinks she may have been bit by something. Has been using neosporin without relief.

## 2018-08-07 NOTE — ED Provider Notes (Signed)
[facility] Provider Note   CSN: [id number] Arrival date & time: 08/07/18  2117     History   Chief Complaint Chief Complaint  Patient presents with  . Insect Bite    HPI Lynda RainwaterChelsea Perez is a 28 y.o. female.     Patient presents to the emergency department with a chief complaint of insect bite.  She states that she believes she was bitten by something on her right inner thigh a couple of days ago.  She has an area of swelling and redness with some blisters.  She states that it is somewhat itchy and somewhat painful.  She has tried using Neosporin with no significant improvement.  She denies any fevers or chills.  She do not see anything bite her.  The history is provided by the patient. No language interpreter was used.    Past Medical History:  Diagnosis Date  . Anemia    as a child  . Asthma    as a child  . Chlamydia   . GERD (gastroesophageal reflux disease)   . Headache(784.0)    migranes  . Seasonal allergies   . Trichomonas infection     Patient Active Problem List   Diagnosis Date Noted  . Indication for care in labor or delivery 10/26/2017  . Bacterial vaginitis 03/27/2017  . Intrauterine pregnancy 03/27/2017  . GERD (gastroesophageal reflux disease) 03/27/2017  . Morning sickness 03/27/2017  . Upper back pain 10/28/2013  . Obesity (BMI 30.0-34.9) 10/28/2013  . Asthma     Past Surgical History:  Procedure Laterality Date  . CESAREAN SECTION N/A 10/26/2017   Procedure: CESAREAN SECTION;  Surgeon: Marlow Baarslark, Dyanna, MD;  [facility];  Service: Obstetrics;  Laterality: N/A;  . NO PAST SURGERIES       OB History    Gravida  2   Para  1   Term  1   Preterm      AB  1   Living  1     SAB  1   TAB      Ectopic      Multiple  0   Live Births  1            Home Medications    Prior to Admission medications   Medication Sig Start Date End Date Taking? Authorizing Provider   albuterol (PROVENTIL HFA;VENTOLIN HFA) 108 (90 BASE) MCG/ACT inhaler Inhale into the lungs every 6 (six) hours as needed for wheezing or shortness of breath.    [provider]  calcium carbonate (TUMS - DOSED IN MG ELEMENTAL CALCIUM) 500 MG chewable tablet Chew 2 tablets by mouth at bedtime.    [provider]  IRON PO Take 1 tablet by mouth daily.    [provider]  metoCLOPramide (REGLAN) 10 MG tablet Take 1 tablet (10 mg total) by mouth every 6 (six) hours. Patient not taking: Reported on 10/26/2017 03/27/17   Raelyn Moraawson, Rolitta, CNM  oxyCODONE-acetaminophen (PERCOCET/ROXICET) 5-325 MG tablet Take 1 tablet by mouth every 4 (four) hours as needed for up to 30 doses for severe pain. 10/27/17   Carrington ClampHorvath, Michelle, MD  Prenatal Vit-Fe Fumarate-FA (PRENATAL COMPLETE) 14-0.4 MG TABS Take 1 tablet by mouth daily. 03/27/17   Raelyn Moraawson, Rolitta, CNM    Family History Family History  Problem Relation Age of Onset  . Drug abuse Mother   . Mental illness Brother   . Mental retardation Brother     Social History Social  History   Tobacco Use  . Smoking status: Never Smoker  . Smokeless tobacco: Never Used  Substance Use Topics  . Alcohol use: Not Currently    Alcohol/week: 19.0 standard drinks    Types: 5 Glasses of wine, 14 Shots of liquor per week  . Drug use: Not Currently    Frequency: 7.0 times per week    Types: Marijuana    Comment: last use in Jan 2019     Allergies   Shellfish allergy   Review of Systems Review of Systems  All other systems reviewed and are negative.    Physical Exam Updated Vital Signs BP 117/67 (BP Location: Right Arm)   Pulse 84   Temp 98.4 F (36.9 C) (Oral)   Resp 18   SpO2 100%   Physical Exam Vitals signs and nursing note reviewed.  Constitutional:      General: She is not in acute distress.    Appearance: She is well-developed.  HENT:     Head: Normocephalic and atraumatic.  Eyes:     Conjunctiva/sclera:  Conjunctivae normal.  Neck:     Musculoskeletal: Neck supple.  Cardiovascular:     Rate and Rhythm: Normal rate and regular rhythm.     Heart sounds: No murmur.  Pulmonary:     Effort: Pulmonary effort is normal. No respiratory distress.     Breath sounds: Normal breath sounds.  Abdominal:     Palpations: Abdomen is soft.     Tenderness: There is no abdominal tenderness.  Musculoskeletal: Normal range of motion.  Skin:    General: Skin is warm and dry.     Comments: 4 x 4 centimeter circular erythematous lesion on right inner distal thigh with some centralized vesicles, mildly warm to touch, no induration  Neurological:     Mental Status: She is alert and oriented to person, place, and time.  Psychiatric:        Mood and Affect: Mood normal.        Behavior: Behavior normal.        Thought Content: Thought content normal.        Judgment: Judgment normal.      ED Treatments / Results  Labs (all labs ordered are listed, but only abnormal results are displayed) Labs Reviewed - No data to display  EKG    Radiology No results found.  Procedures Procedures (including critical care time)  Medications Ordered in ED Medications - No data to display   Initial Impression / Assessment and Plan / ED Course  I have reviewed the triage vital signs and the nursing notes.  Pertinent labs & imaging results that were available during my care of the patient were reviewed by me and considered in my medical decision making (see chart for details).        Patient with allergic reaction to probable insect bite and possibly superimposed infection.  Does not appear toxic.  Vital signs are stable.  Will cover with Keflex for possible superimposed infection and treat with Zyrtec for allergy.  Patient is noted to be breast-feeding.  Final Clinical Impressions(s) / ED Diagnoses   Final diagnoses:  Allergic reaction, initial encounter  Insect bite of right thigh, initial encounter     ED Discharge Orders    None       Montine Circle, PA-C 08/07/18 2231    Charlesetta Shanks, MD 08/16/18 [phone]

## 2019-03-22 ENCOUNTER — Inpatient Hospital Stay (HOSPITAL_COMMUNITY)
Admission: AD | Admit: 2019-03-22 | Discharge: 2019-03-22 | Disposition: A | Discharge status: Home / Self Care | Payer: Medicaid Other | Attending: Obstetrics and Gynecology | Admitting: Obstetrics and Gynecology

## 2019-03-22 ENCOUNTER — Other Ambulatory Visit: Payer: Self-pay

## 2019-03-22 ENCOUNTER — Inpatient Hospital Stay (HOSPITAL_COMMUNITY): Payer: Medicaid Other

## 2019-03-22 ENCOUNTER — Encounter (HOSPITAL_COMMUNITY): Payer: Self-pay | Admitting: Obstetrics and Gynecology

## 2019-03-22 DIAGNOSIS — Z3A00 Weeks of gestation of pregnancy not specified: Secondary | ICD-10-CM | POA: Diagnosis not present

## 2019-03-22 DIAGNOSIS — O34219 Maternal care for unspecified type scar from previous cesarean delivery: Secondary | ICD-10-CM | POA: Diagnosis not present

## 2019-03-22 DIAGNOSIS — O209 Hemorrhage in early pregnancy, unspecified: Secondary | ICD-10-CM | POA: Diagnosis not present

## 2019-03-22 DIAGNOSIS — O3680X0 Pregnancy with inconclusive fetal viability, not applicable or unspecified: Secondary | ICD-10-CM | POA: Insufficient documentation

## 2019-03-22 DIAGNOSIS — N939 Abnormal uterine and vaginal bleeding, unspecified: Secondary | ICD-10-CM | POA: Diagnosis present

## 2019-03-22 LAB — CBC
HCT: 32.8 % — ABNORMAL LOW (ref 36.0–46.0)
Hemoglobin: 10.4 g/dL — ABNORMAL LOW (ref 12.0–15.0)
MCH: 23.9 pg — ABNORMAL LOW (ref 26.0–34.0)
MCHC: 31.7 g/dL (ref 30.0–36.0)
MCV: 75.2 fL — ABNORMAL LOW (ref 80.0–100.0)
Platelets: 304 10*3/uL (ref 150–400)
RBC: 4.36 MIL/uL (ref 3.87–5.11)
RDW: 15.9 % — ABNORMAL HIGH (ref 11.5–15.5)
WBC: 8.3 10*3/uL (ref 4.0–10.5)
nRBC: 0 % (ref 0.0–0.2)

## 2019-03-22 LAB — URINALYSIS, ROUTINE W REFLEX MICROSCOPIC
Bacteria, UA: NONE SEEN
Bilirubin Urine: NEGATIVE
Glucose, UA: NEGATIVE mg/dL
Ketones, ur: NEGATIVE mg/dL
Nitrite: NEGATIVE
Protein, ur: 30 mg/dL — AB
RBC / HPF: >50 RBC/hpf — ABNORMAL HIGH (ref 0–5)
Specific Gravity, Urine: 1.016 (ref 1.005–1.030)
pH: 6 (ref 5.0–8.0)

## 2019-03-22 LAB — POCT PREGNANCY, URINE: Preg Test, Ur: POSITIVE — AB

## 2019-03-22 LAB — HIV ANTIBODY (ROUTINE TESTING W REFLEX): HIV Screen 4th Generation wRfx: NONREACTIVE

## 2019-03-22 LAB — HCG, QUANTITATIVE, PREGNANCY: hCG, Beta Chain, Quant, S: 2615 m[IU]/mL — ABNORMAL HIGH (ref <5)

## 2019-03-22 NOTE — Discharge Instructions (Signed)

## 2019-03-22 NOTE — MAU Note (Signed)
Pt reports she had wet prep and gc/c swabs done in the office this past week and everything was negative. Hogan CNM informed and states we do not need to repeat them today and will cancel the orders.

## 2019-03-22 NOTE — MAU Note (Signed)
Lindsay Perez is a 29 y.o. here in MAU reporting: started having light bleeding on Tuesday after leaving her OB. States she had a pelvic exam and swabs done in the office that day. Last night bleeding got heavier and having some clots. This AM had another episode of heavy bleeding and clots. States she was wearing a tampon this morning and it was soaked through, was a super tampon. Having some upper and lower abdominal pain. Also feeling pain in her hips. States pain is intermittent and is not currently having pain.  LMP: unknown, pt reports she has irregular periods and last one was in October or November, has appointment coming up in the office for dating  Onset of complaint: since Tuesday but getting worse  Pain score: 0/10  Vitals:   03/22/19 0759  BP: 115/66  Pulse: 90  Resp: 16  Temp: 98.3 F (36.8 C)  SpO2: 98%     Lab orders placed from triage: UA, UPT

## 2019-03-22 NOTE — MAU Provider Note (Signed)
History     CSN: [id number]  Arrival date and time: 03/22/19 0938   First Provider Initiated Contact with Patient 03/22/19 0809      Chief Complaint  Patient presents with  . Vaginal Bleeding   Lindsay Perez is a 29 y.o. G3P1011 at Unknown who presents today with vaginal bleeding. She is unsure of her LMP thinks October or November. She stopped OCPs in November. She was in the office on 03/19/19 and had pelvic exam. She had some light bleeding after that, but then heavier bleeding started last evening.   Vaginal Bleeding The patient's primary symptoms include vaginal bleeding. The patient's pertinent negatives include no pelvic pain or vaginal discharge. This is a new problem. The current episode started yesterday. The problem occurs constantly. The problem has been unchanged. She is pregnant. Pertinent negatives include no chills, dysuria, fever, frequency, nausea or vomiting. The vaginal discharge was bloody. The vaginal bleeding is typical of menses. She has been passing clots. She has not been passing tissue. Exacerbated by: had pelvic exam on 2/2. Light bleeding started after exam. She has tried nothing for the symptoms. She uses nothing (stopped birth control in November) for contraception. Her menstrual history has been irregular (LMP unknown ).    OB History    Gravida  3   Para  1   Term  1   Preterm      AB  1   Living  1     SAB  1   TAB      Ectopic      Multiple  0   Live Births  1           Past Medical History:  Diagnosis Date  . Anemia    as a child  . Asthma    as a child  . Chlamydia   . GERD (gastroesophageal reflux disease)   . Headache(784.0)    migranes  . Seasonal allergies   . Trichomonas infection     Past Surgical History:  Procedure Laterality Date  . CESAREAN SECTION N/A 10/26/2017   Procedure: CESAREAN SECTION;  Surgeon: Marlow Baars, MD;  Location: [facility] BIRTHING SUIT[facility];  Service: Obstetrics;  Laterality: N/A;  . NO PAST  SURGERIES    . WISDOM TOOTH EXTRACTION      Family History  Problem Relation Age of Onset  . Drug abuse Mother   . Mental illness Brother   . Mental retardation Brother     Social History   Tobacco Use  . Smoking status: Never Smoker  . Smokeless tobacco: Never Used  Substance Use Topics  . Alcohol use: Not Currently    Alcohol/week: 19.0 standard drinks    Types: 5 Glasses of wine, 14 Shots of liquor per week  . Drug use: Not Currently    Frequency: 7.0 times per week    Types: Marijuana    Comment: last use in Jan 2019    Allergies:  Allergies  Allergen Reactions  . Shellfish Allergy Anaphylaxis    Medications Prior to Admission  Medication Sig Dispense Refill Last Dose  . albuterol (PROVENTIL HFA;VENTOLIN HFA) 108 (90 BASE) MCG/ACT inhaler Inhale into the lungs every 6 (six) hours as needed for wheezing or shortness of breath.     . calcium carbonate (TUMS - DOSED IN MG ELEMENTAL CALCIUM) 500 MG chewable tablet Chew 2 tablets by mouth at bedtime.     . cephALEXin (KEFLEX) 500 MG capsule Take 1 capsule (500 mg total) by mouth  2 (two) times daily. 14 capsule 0   . cetirizine (ZYRTEC ALLERGY) 10 MG tablet Take 1 tablet (10 mg total) by mouth daily. 30 tablet 1   . IRON PO Take 1 tablet by mouth daily.     . metoCLOPramide (REGLAN) 10 MG tablet Take 1 tablet (10 mg total) by mouth every 6 (six) hours. (Patient not taking: Reported on 10/26/2017) 30 tablet 0   . oxyCODONE-acetaminophen (PERCOCET/ROXICET) 5-325 MG tablet Take 1 tablet by mouth every 4 (four) hours as needed for up to 30 doses for severe pain. 30 tablet 0   . Prenatal Vit-Fe Fumarate-FA (PRENATAL COMPLETE) 14-0.4 MG TABS Take 1 tablet by mouth daily. 30 each 12     Review of Systems  Constitutional: Negative for chills and fever.  Gastrointestinal: Negative for nausea and vomiting.  Genitourinary: Positive for vaginal bleeding. Negative for dysuria, frequency, pelvic pain and vaginal discharge.   Physical  Exam   Blood pressure 115/66, pulse 90, temperature 98.3 F (36.8 C), temperature source Oral, resp. rate 16, height 5' 1.75" (1.568 m), weight 113.7 kg, SpO2 98 %, unknown if currently breastfeeding.  Physical Exam  Nursing note and vitals reviewed. Constitutional: She is oriented to person, place, and time. She appears well-developed and well-nourished. No distress.  HENT:  Head: Normocephalic.  Cardiovascular: Normal rate.  Respiratory: Effort normal.  GI: Soft. There is no abdominal tenderness. There is no rebound.  Neurological: She is alert and oriented to person, place, and time.  Skin: Skin is warm and dry.  Psychiatric: She has a normal mood and affect.     Results for orders placed or performed during the hospital encounter of 03/22/19 (from the past 24 hour(s))  Urinalysis, Routine w reflex microscopic     Status: Abnormal   Collection Time: 03/22/19  7:39 AM  Result Value Ref Range   Color, Urine YELLOW YELLOW   APPearance HAZY (A) CLEAR   Specific Gravity, Urine 1.016 1.005 - 1.030   pH 6.0 5.0 - 8.0   Glucose, UA NEGATIVE NEGATIVE mg/dL   Hgb urine dipstick LARGE (A) NEGATIVE   Bilirubin Urine NEGATIVE NEGATIVE   Ketones, ur NEGATIVE NEGATIVE mg/dL   Protein, ur 30 (A) NEGATIVE mg/dL   Nitrite NEGATIVE NEGATIVE   Leukocytes,Ua TRACE (A) NEGATIVE   RBC / HPF >50 (H) 0 - 5 RBC/hpf   WBC, UA 11-20 0 - 5 WBC/hpf   Bacteria, UA NONE SEEN NONE SEEN   Squamous Epithelial / LPF 0-5 0 - 5  Pregnancy, urine POC     Status: Abnormal   Collection Time: 03/22/19  7:40 AM  Result Value Ref Range   Preg Test, Ur POSITIVE (A) NEGATIVE  hCG, quantitative, pregnancy     Status: Abnormal   Collection Time: 03/22/19  8:36 AM  Result Value Ref Range   hCG, Beta Chain, Quant, S 2,615 (H) <5 mIU/mL  CBC     Status: Abnormal   Collection Time: 03/22/19  8:36 AM  Result Value Ref Range   WBC 8.3 4.0 - 10.5 K/uL   RBC 4.36 3.87 - 5.11 MIL/uL   Hemoglobin 10.4 (L) 12.0 - 15.0  g/dL   HCT 52.7 (L) 78.2 - 42.3 %   MCV 75.2 (L) 80.0 - 100.0 fL   MCH 23.9 (L) 26.0 - 34.0 pg   MCHC 31.7 30.0 - 36.0 g/dL   RDW 53.6 (H) 14.4 - 31.5 %   Platelets 304 150 - 400 K/uL   nRBC 0.0 0.0 -  0.2 %   US OB LESS THAN 14 WEEKS WITH OB TRANSVAGINAL  Result Date: 03/22/2019 CLINICAL DATA:  29 year old pregnant female presents with heavy bleeding. Quantitative beta HCG pending. Uncertain LMP. EXAM: OBSTETRIC <14 WK [place] AND TRANSVAGINAL OB US TECHNIQUE: Both transabdominal and transvaginal ultrasound examinations were performed for complete evaluation of the gestation as well as the maternal uterus, adnexal regions, and pelvic cul-de-sac. Transvaginal technique was performed to assess early pregnancy. COMPARISON:  No prior scans from this gestation. FINDINGS: Intrauterine gestational sac: None Maternal uterus/adnexae: Anteverted uterus. No uterine fibroids. Heterogeneous endometrium measuring 10 mm in bilayer thickness. No focal endometrial mass. Right ovary measures 4.1 x 1.8 x 3.4 cm. Left ovary measures 2.9 x 1.4 x 2.3 cm. No abnormal ovarian or adnexal masses. No abnormal free fluid in the pelvis. IMPRESSION: Non-localization of the pregnancy on this scan. No intrauterine gestational sac. Heterogeneous mildly thickened (10 mm) endometrium. No abnormal ovarian or adnexal masses. Sonographic differential diagnosis includes intrauterine gestation too early to visualize, spontaneous abortion or occult ectopic gestation. Recommend close clinical follow-up and serial serum beta HCG monitoring, with repeat obstetric scan as warranted by beta HCG levels and clinical assessment. Electronically Signed   By: Ilona Sorrel M.D.   On: 03/22/2019 09:11    MAU Course  Procedures  MDM   Assessment and Plan   1. Pregnancy of unknown anatomic location   2. Vaginal bleeding in pregnancy, first trimester    DC home 1st Trimester precautions  Bleeding precautions Ectopic precautions RX: none  Return to  MAU as needed Return to MAU in 48 hours for repeat HCG   Follow-up Information    [facility] Follow up.   Specialty: Obstetrics and Gynecology Why: RETURN SUNDAY MORNING FOR REPEAT BLOOD WORK  Contact information: [address] [phone] [place] Kempner DNP, CNM  03/22/19  10:12 AM

## 2019-03-24 ENCOUNTER — Other Ambulatory Visit: Payer: Self-pay

## 2019-03-24 ENCOUNTER — Inpatient Hospital Stay (HOSPITAL_COMMUNITY)
Admission: AD | Admit: 2019-03-24 | Discharge: 2019-03-24 | Disposition: A | Discharge status: Home / Self Care | Payer: Medicaid Other | Attending: Obstetrics and Gynecology | Admitting: Obstetrics and Gynecology

## 2019-03-24 DIAGNOSIS — Z6790 Unspecified blood type, Rh positive: Secondary | ICD-10-CM | POA: Diagnosis not present

## 2019-03-24 DIAGNOSIS — O039 Complete or unspecified spontaneous abortion without complication: Secondary | ICD-10-CM | POA: Insufficient documentation

## 2019-03-24 LAB — HCG, QUANTITATIVE, PREGNANCY: hCG, Beta Chain, Quant, S: 337 m[IU]/mL — ABNORMAL HIGH (ref <5)

## 2019-03-24 NOTE — MAU Note (Signed)
Pt reports to mau for repeat labs.  Pt reports her bleeding is less than when she was last seen and her pain is "a little worse than before, but not enough for me to come to the hospital"

## 2019-03-24 NOTE — MAU Provider Note (Signed)
Subjective:  Lindsay Perez is a 29 y.o. G3P1011 at Unknown who presents today for FU BHCG. She was seen on 03/22/2019. Results from that day show no IUP on [place], and HCG 2,615. She reports vaginal bleeding, slightly heaver than a normal period. She reports abdominal or pelvic pain as menstrual-like cramping slightly worse than her normal period.  Objective:  Physical Exam  Nursing note and vitals reviewed. Patient Vitals for the past 24 hrs:  BP Temp Temp src Pulse Resp SpO2  03/24/19 0913 130/80 98.5 F (36.9 C) Oral 84 18 98 %  03/24/19 0912 -- -- -- -- -- 99 %  ' Constitutional: She is oriented to person, place, and time. She appears well-developed and well-nourished. No distress.  HENT:  Head: Normocephalic.  Cardiovascular: Normal rate.  Respiratory: Effort normal.  GI: Soft. There is no tenderness.  Neurological: She is alert and oriented to person, place, and time. Skin: Skin is warm and dry.  Psychiatric: She has a normal mood and affect.   Results for orders placed or performed during the hospital encounter of 03/24/19 (from the past 24 hour(s))  hCG, quantitative, pregnancy     Status: Abnormal   Collection Time: 03/24/19  9:05 AM  Result Value Ref Range   hCG, Beta Chain, Quant, S 337 (H) <5 mIU/mL    Assessment/Plan: Miscarriage RH Positive HCG dropped significantly FU in 1 weeks for: repeat hCG Called after hours line at [facility] and left mesdsage recommending f f/u quant in one week and provider visit/hCG in 2 weeks Pt advised to call office on Wednesday for appt if she does not hear from them sooner Return precautions given Patient discharged to home in stable condition  Gaetan Spieker, Odie Sera, NP  10:20 AM 03/24/2019

## 2019-03-24 NOTE — MAU Note (Signed)
Pt discharged out of triage by NP

## 2019-03-24 NOTE — Discharge Instructions (Signed)
Miscarriage A miscarriage is the loss of an unborn baby (fetus) before the 20th week of pregnancy. Most miscarriages happen during the first 3 months of pregnancy. Sometimes, a miscarriage can happen before a woman knows that she is pregnant. Having a miscarriage can be an emotional experience. If you have had a miscarriage, talk with your health care provider about any questions you may have about miscarrying, the grieving process, and your plans for future pregnancy. What are the causes? A miscarriage may be caused by:  Problems with the genes or chromosomes of the fetus. These problems make it impossible for the baby to develop normally. They are often the result of random errors that occur early in the development of the baby, and are not passed from parent to child (not inherited).  Infection of the cervix or uterus.  Conditions that affect hormone balance in the body.  Problems with the cervix, such as the cervix opening and thinning before pregnancy is at term (cervical insufficiency).  Problems with the uterus. These may include: ? A uterus with an abnormal shape. ? Fibroids in the uterus. ? Congenital abnormalities. These are problems that were present at birth.  Certain medical conditions.  Smoking, drinking alcohol, or using drugs.  Injury (trauma). In many cases, the cause of a miscarriage is not known. What are the signs or symptoms? Symptoms of this condition include:  Vaginal bleeding or spotting, with or without cramps or pain.  Pain or cramping in the abdomen or lower back.  Passing fluid, tissue, or blood clots from the vagina. How is this diagnosed? This condition may be diagnosed based on:  A physical exam.  Ultrasound.  Blood tests.  Urine tests. How is this treated? Treatment for a miscarriage is sometimes not necessary if you naturally pass all the tissue that was in your uterus. If necessary, this condition may be treated with:  Dilation and  curettage (D&C). This is a procedure in which the cervix is stretched open and the lining of the uterus (endometrium) is scraped. This is done only if tissue from the fetus or placenta remains in the body (incomplete miscarriage).  Medicines, such as: ? Antibiotic medicine, to treat infection. ? Medicine to help the body pass any remaining tissue. ? Medicine to reduce (contract) the size of the uterus. These medicines may be given if you have a lot of bleeding. If you have Rh negative blood and your baby was Rh positive, you will need a shot of a medicine called Rh immunoglobulinto protect your future babies from Rh blood problems. "Rh-negative" and "Rh-positive" refer to whether or not the blood has a specific protein found on the surface of red blood cells (Rh factor). Follow these instructions at home: Medicines   Take over-the-counter and prescription medicines only as told by your health care provider.  If you were prescribed antibiotic medicine, take it as told by your health care provider. Do not stop taking the antibiotic even if you start to feel better.  Do not take NSAIDs, such as aspirin and ibuprofen, unless they are approved by your health care provider. These medicines can cause bleeding. Activity  Rest as directed. Ask your health care provider what activities are safe for you.  Have someone help with home and family responsibilities during this time. General instructions  Keep track of the number of sanitary pads you use each day and how soaked (saturated) they are. Write down this information.  Monitor the amount of tissue or blood clots that   you pass from your vagina. Save any large amounts of tissue for your health care provider to examine.  Do not use tampons, douche, or have sex until your health care provider approves.  To help you and your partner with the process of grieving, talk with your health care provider or seek counseling.  When you are ready, meet with  your health care provider to discuss any important steps you should take for your health. Also, discuss steps you should take to have a healthy pregnancy in the future.  Keep all follow-up visits as told by your health care provider. This is important. Where to find more information  The American Congress of Obstetricians and Gynecologists: www.acog.org  [place] Department of Health and Human Services Office of Women's Health: www.womenshealth.gov Contact a health care provider if:  You have a fever or chills.  You have a foul smelling vaginal discharge.  You have more bleeding instead of less. Get help right away if:  You have severe cramps or pain in your back or abdomen.  You pass blood clots or tissue from your vagina that is walnut-sized or larger.  You soak more than 1 regular sanitary pad in an hour.  You become light-headed or weak.  You pass out.  You have feelings of sadness that take over your thoughts, or you have thoughts of hurting yourself. Summary  Most miscarriages happen in the first 3 months of pregnancy. Sometimes miscarriage happens before a woman even knows that she is pregnant.  Follow your health care provider's instruction for home care. Keep all follow-up appointments.  To help you and your partner with the process of grieving, talk with your health care provider or seek counseling. This information is not intended to replace advice given to you by your health care provider. Make sure you discuss any questions you have with your health care provider. Document Revised: 05/25/2018 Document Reviewed: 03/08/2016 Elsevier Patient Education  2020 Elsevier Inc.   Managing Pregnancy Loss Pregnancy loss can happen any time during a pregnancy. Often the cause is not known. It is rarely because of anything you did. Pregnancy loss in early pregnancy (during the first trimester) is called a miscarriage. This type of pregnancy loss is the most common. Pregnancy loss  that happens after 20 weeks of pregnancy is called fetal demise if the baby's heart stops beating before birth. Fetal demise is much less common. Some women experience spontaneous labor shortly after fetal demise resulting in a stillborn birth (stillbirth). Any pregnancy loss can be devastating. You will need to recover both physically and emotionally. Most women are able to get pregnant again after a pregnancy loss and deliver a healthy baby. How to manage emotional recovery  Pregnancy loss is very hard emotionally. You may feel many different emotions while you grieve. You may feel sad and angry. You may also feel guilty. It is normal to have periods of crying. Emotional recovery can take longer than physical recovery. It is different for everyone. Taking these steps can help you in managing this loss:  Remember that it is unlikely you did anything to cause the pregnancy loss.  Share your thoughts and feelings with friends, family, and your partner. Remember that your partner is also recovering emotionally.  Make sure you have a good support system. Do not spend too much time alone.  Meet with a pregnancy loss counselor or join a pregnancy loss support group.  Get enough sleep and eat a healthy diet. Return to regular exercise   when you have recovered physically.  Do not use drugs or alcohol to manage your emotions.  Consider seeing a mental health professional to help you recover emotionally.  Ask a friend or loved one to help you decide what to do with any clothing and nursery items you received for your baby. In the case of a stillbirth, many women benefit from taking additional steps in the grieving process. You may want to:  Hold your baby after the birth.  Name your baby.  Request a birth certificate.  Create a keepsake such as handprints or footprints.  Dress your baby and have a picture taken.  Make funeral arrangements.  Ask for a baptism or blessing. Hospitals have  staff members who can help you with all these arrangements. How to recognize emotional stress It is normal to have emotional stress after a pregnancy loss. But emotional stress that lasts a long time or becomes severe requires treatment. Watch out for these signs of severe emotional stress:  Sadness, anger, or guilt that is not going away and is interfering with your normal activities.  Relationship problems that have occurred or gotten worse since the pregnancy loss.  Signs of depression that last longer than 2 weeks. These may include: ? Sadness. ? Anxiety. ? Hopelessness. ? Loss of interest in activities you enjoy. ? Inability to concentrate. ? Trouble sleeping or sleeping too much. ? Loss of appetite or overeating. ? Thoughts of death or of hurting yourself. Follow these instructions at home:  Take over-the-counter and prescription medicines only as told by your health care provider.  Rest at home until your energy level returns. Return to your normal activities as told by your health care provider. Ask your health care provider what activities are safe for you.  When you are ready, meet with your health care provider to discuss steps to take for a future pregnancy.  Keep all follow-up visits as told by your health care provider. This is important. Where to find support  To help you and your partner with the process of grieving, talk with your health care provider or seek counseling.  Consider meeting with others who have experienced pregnancy loss. Ask your health care provider about support groups and resources. Where to find more information  [place] Department of Health and Human Services Office on Women's Health: www.womenshealth.gov  American Pregnancy Association: www.americanpregnancy.org Contact a health care provider if:  You continue to experience grief, sadness, or lack of motivation for everyday activities, and those feelings do not improve over time.  You are  struggling to recover emotionally, especially if you are using alcohol or substances to help. Get help right away if:  You have thoughts of hurting yourself or others. If you ever feel like you may hurt yourself or others, or have thoughts about taking your own life, get help right away. You can go to your nearest emergency department or call:  Your local emergency services (911 in the [place]  A suicide crisis helpline, such as the National Suicide Prevention Lifeline at 1-800-273-8255. This is open 24 hours a day. Summary  Any pregnancy loss can be difficult physically and emotionally.  You may experience many different emotions while you grieve. Emotional recovery can last longer than physical recovery.  It is normal to have emotional stress after a pregnancy loss. But emotional stress that lasts a long time or becomes severe requires treatment.  See your health care provider if you are struggling emotionally after a pregnancy loss. This information   is not intended to replace advice given to you by your health care provider. Make sure you discuss any questions you have with your health care provider. Document Revised: 05/23/2018 Document Reviewed: 04/13/2017 Elsevier Patient Education  2020 Elsevier Inc.  

## 2019-07-01 ENCOUNTER — Encounter: Payer: Self-pay | Admitting: Internal Medicine

## 2019-07-03 ENCOUNTER — Telehealth (INDEPENDENT_AMBULATORY_CARE_PROVIDER_SITE_OTHER): Payer: Medicaid Other | Admitting: Internal Medicine

## 2019-07-03 ENCOUNTER — Other Ambulatory Visit: Payer: Self-pay

## 2019-07-03 DIAGNOSIS — Z7689 Persons encountering health services in other specified circumstances: Secondary | ICD-10-CM | POA: Diagnosis not present

## 2019-07-03 DIAGNOSIS — J4520 Mild intermittent asthma, uncomplicated: Secondary | ICD-10-CM | POA: Diagnosis not present

## 2019-07-03 DIAGNOSIS — R140 Abdominal distension (gaseous): Secondary | ICD-10-CM | POA: Diagnosis not present

## 2019-07-03 MED ORDER — POLYETHYLENE GLYCOL 3350 17 GM/SCOOP PO POWD: 17.0000 g | Freq: Every day | ORAL | 1 refills | Status: DC

## 2019-07-03 MED ORDER — ALBUTEROL SULFATE HFA 108 (90 BASE) MCG/ACT IN AERS: 2.0000 | INHALATION_SPRAY | Freq: Four times a day (QID) | RESPIRATORY_TRACT | 1 refills | Status: AC | PRN

## 2019-07-03 MED ORDER — SIMETHICONE 80 MG PO CHEW: 80.0000 mg | CHEWABLE_TABLET | Freq: Four times a day (QID) | ORAL | 0 refills | Status: DC | PRN

## 2019-07-03 NOTE — Progress Notes (Signed)
Virtual Visit via Telephone Note  I connected with Lindsay Perez, on 07/03/2019 at 2:05 PM by telephone due to the COVID-19 pandemic and verified that I am speaking with the correct person using two identifiers.   Consent: I discussed the limitations, risks, security and privacy concerns of performing an evaluation and management service by telephone and the availability of in person appointments. I also discussed with the patient that there may be a patient responsible charge related to this service. The patient expressed understanding and agreed to proceed.   Location of Patient: Home   Location of Provider: Clinic    Persons participating in Telemedicine visit: Ramesha Poster [facility] Dr. Juleen China    History of Present Illness: Patient has a visit to establish care. She has a PMH of asthma. She does not currently have any inhalers. She does report that it typically gets worse during her seasonal allergies.   Patient has some concerns about feeling bloated. She reports she has been dieting for a couple months. Has been eating a lot of salads---daily for lunch currently. Doesn't drink a whole lot of water. She is feeling constipated and having to strain.    Past Medical History:  Diagnosis Date  . Anemia    as a child  . Asthma    as a child  . Chlamydia   . GERD (gastroesophageal reflux disease)   . Headache(784.0)    migranes  . Postpartum depression   . Seasonal allergies   . Trichomonas infection    Allergies  Allergen Reactions  . Shellfish Allergy Anaphylaxis    Current Outpatient Medications on File Prior to Visit  Medication Sig Dispense Refill  . albuterol (PROVENTIL HFA;VENTOLIN HFA) 108 (90 BASE) MCG/ACT inhaler Inhale into the lungs every 6 (six) hours as needed for wheezing or shortness of breath.     No current facility-administered medications on file prior to visit.    Observations/Objective: NAD. Speaking clearly.  Work of  breathing normal.  Alert and oriented. Mood appropriate.   Assessment and Plan: 1. Encounter to establish care Reviewed patient's PMH, social history, surgical history, and medications.   2. Mild intermittent asthma without complication - albuterol (VENTOLIN HFA) 108 (90 Base) MCG/ACT inhaler; Inhale 2 puffs into the lungs every 6 (six) hours as needed for wheezing or shortness of breath.  Dispense: 18 g; Refill: 1  3. Abdominal bloating Discussed with patient that increased intake of fibrous foods can lead to natural bloating. Also encouraged adequate hydration to help prevent water retention and constipation. Treat bloating with Simethicone and constipation with Miralax. Return prn.  - polyethylene glycol powder (GLYCOLAX/MIRALAX) 17 GM/SCOOP powder; Take 17 g by mouth daily.  Dispense: 3350 g; Refill: 1 - simethicone (GAS-X) 80 MG chewable tablet; Chew 1 tablet (80 mg total) by mouth every 6 (six) hours as needed (bloating).  Dispense: 30 tablet; Refill: 0    Follow Up Instructions: PRN and for annual exam    I discussed the assessment and treatment plan with the patient. The patient was provided an opportunity to ask questions and all were answered. The patient agreed with the plan and demonstrated an understanding of the instructions.   The patient was advised to call back or seek an in-person evaluation if the symptoms worsen or if the condition fails to improve as anticipated.     I provided 12 minutes total of non-face-to-face time during this encounter including median intraservice time, reviewing previous notes, investigations, ordering medications, medical decision making, coordinating care  and patient verbalized understanding at the end of the visit.    Marcy Siren, D.O. Primary Care at [facility]  07/03/2019, 2:05 PM

## 2019-07-26 ENCOUNTER — Telehealth: Payer: Self-pay | Admitting: General Practice

## 2019-07-26 DIAGNOSIS — R140 Abdominal distension (gaseous): Secondary | ICD-10-CM

## 2019-07-26 NOTE — Telephone Encounter (Signed)
Please advise 

## 2019-07-26 NOTE — Telephone Encounter (Signed)
Pt called the medication isn't working she feels even more bolted

## 2019-07-29 ENCOUNTER — Encounter: Payer: Self-pay | Admitting: Nurse Practitioner

## 2019-07-29 NOTE — Telephone Encounter (Signed)
I put in a referral to GI for the abdominal bloating since the medications and changes we were trying to do weren't working.   Marcy Siren, D.O. Primary Care at [facility]  07/29/2019, 9:37 AM

## 2019-07-30 ENCOUNTER — Ambulatory Visit: Payer: Medicaid Other | Admitting: Internal Medicine

## 2019-08-23 ENCOUNTER — Encounter: Payer: Medicaid Other | Admitting: Internal Medicine

## 2019-08-23 LAB — LIPID PANEL
Cholesterol: 143 (ref 0–200)
HDL: 40 (ref 35–70)
LDL Cholesterol: 86
Triglycerides: 89 (ref 40–160)

## 2019-08-23 LAB — CBC WITH DIFFERENTIAL/PLATELET
Basophils Absolute: 0
Basophils, %: 1
Eosinophils Absolute: 0
Eosinophils, %: 1
HCT: 33 (ref 29–41)
Hemoglobin: 10.6
Lymphocytes Absolute: 2 /uL
Lymphocytes, %: 41
MCH: 24
MCHC: 32.5
MCV: 74 — AB (ref 76–111)
Monocytes Absolute: 0
Monocytes: 7
Neutrophils Absolute: 3 /uL
Neutrophils, %: 50
RBC: 4.42 (ref 3.87–5.11)
RDW: 16.1
WBC: 5.6
platelet count: 262

## 2019-08-23 LAB — TSH: TSH: 1.11 (ref <=5.90)

## 2019-08-23 LAB — HEMOGLOBIN A1C: Hemoglobin A1C: 5.7

## 2019-09-02 ENCOUNTER — Ambulatory Visit: Payer: Medicaid Other | Admitting: Nurse Practitioner

## 2019-09-09 ENCOUNTER — Encounter: Payer: Medicaid Other | Admitting: Internal Medicine

## 2019-10-17 DIAGNOSIS — Z3480 Encounter for supervision of other normal pregnancy, unspecified trimester: Secondary | ICD-10-CM | POA: Diagnosis not present

## 2019-10-17 DIAGNOSIS — Z3490 Encounter for supervision of normal pregnancy, unspecified, unspecified trimester: Secondary | ICD-10-CM | POA: Insufficient documentation

## 2019-10-17 DIAGNOSIS — Z113 Encounter for screening for infections with a predominantly sexual mode of transmission: Secondary | ICD-10-CM | POA: Diagnosis not present

## 2019-10-17 DIAGNOSIS — Z3481 Encounter for supervision of other normal pregnancy, first trimester: Secondary | ICD-10-CM | POA: Diagnosis not present

## 2019-10-17 DIAGNOSIS — O26841 Uterine size-date discrepancy, first trimester: Secondary | ICD-10-CM | POA: Diagnosis not present

## 2019-10-17 DIAGNOSIS — Z3201 Encounter for pregnancy test, result positive: Secondary | ICD-10-CM | POA: Diagnosis not present

## 2019-10-17 LAB — OB RESULTS CONSOLE RUBELLA ANTIBODY, IGM: Rubella: IMMUNE

## 2019-10-17 LAB — OB RESULTS CONSOLE GC/CHLAMYDIA
Chlamydia: NEGATIVE
Gonorrhea: NEGATIVE

## 2019-10-17 LAB — OB RESULTS CONSOLE HIV ANTIBODY (ROUTINE TESTING): HIV: NONREACTIVE

## 2019-10-17 LAB — OB RESULTS CONSOLE ANTIBODY SCREEN: Antibody Screen: NEGATIVE

## 2019-10-17 LAB — OB RESULTS CONSOLE HEPATITIS B SURFACE ANTIGEN: Hepatitis B Surface Ag: NEGATIVE

## 2019-10-17 LAB — HM HEPATITIS C SCREENING LAB: HM Hepatitis Screen: NEGATIVE

## 2019-10-17 LAB — OB RESULTS CONSOLE RPR: RPR: NONREACTIVE

## 2019-11-14 DIAGNOSIS — Z23 Encounter for immunization: Secondary | ICD-10-CM | POA: Diagnosis not present

## 2019-11-14 DIAGNOSIS — Z3482 Encounter for supervision of other normal pregnancy, second trimester: Secondary | ICD-10-CM | POA: Diagnosis not present

## 2019-12-16 DIAGNOSIS — Z6841 Body Mass Index (BMI) 40.0 and over, adult: Secondary | ICD-10-CM | POA: Diagnosis not present

## 2019-12-16 DIAGNOSIS — Z363 Encounter for antenatal screening for malformations: Secondary | ICD-10-CM | POA: Diagnosis not present

## 2020-01-14 ENCOUNTER — Inpatient Hospital Stay: Payer: Medicaid Other | Attending: Oncology

## 2020-01-14 ENCOUNTER — Other Ambulatory Visit: Payer: Self-pay

## 2020-01-14 DIAGNOSIS — Z23 Encounter for immunization: Secondary | ICD-10-CM | POA: Diagnosis not present

## 2020-01-14 DIAGNOSIS — K219 Gastro-esophageal reflux disease without esophagitis: Secondary | ICD-10-CM | POA: Insufficient documentation

## 2020-01-15 NOTE — Progress Notes (Signed)
Late entry from 01/14/20    Covid-19 Vaccination Clinic  Name:  Lindsay Perez    MRN: [id number] DOB: 1990-10-20  01/15/2020  Lindsay Perez was observed post Covid-19 immunization for 30 minutes based on pre-vaccination screening without incident. She was provided with Vaccine Information Sheet and instruction to access the V-Safe system.   Lindsay Perez was instructed to call 911 with any severe reactions post vaccine: Marland Kitchen Difficulty breathing  . Swelling of face and throat  . A fast heartbeat  . A bad rash all over body  . Dizziness and weakness   Immunizations Administered    Name Date Dose VIS Date Route   Pfizer COVID-19 Vaccine 01/14/2020  2:07 PM 0.3 mL 12/04/2019 Intramuscular   Manufacturer: [employer], [employer]   Lot: [id number]   NDC: [address]-0

## 2020-01-29 ENCOUNTER — Inpatient Hospital Stay (HOSPITAL_COMMUNITY)
Admission: AD | Admit: 2020-01-29 | Discharge: 2020-01-29 | Disposition: A | Discharge status: Home / Self Care | Payer: Medicaid Other | Attending: Obstetrics and Gynecology | Admitting: Obstetrics and Gynecology

## 2020-01-29 ENCOUNTER — Other Ambulatory Visit: Payer: Self-pay

## 2020-01-29 ENCOUNTER — Encounter (HOSPITAL_COMMUNITY): Payer: Self-pay | Admitting: Obstetrics and Gynecology

## 2020-01-29 ENCOUNTER — Inpatient Hospital Stay (HOSPITAL_COMMUNITY): Payer: Medicaid Other

## 2020-01-29 DIAGNOSIS — R079 Chest pain, unspecified: Secondary | ICD-10-CM | POA: Diagnosis not present

## 2020-01-29 DIAGNOSIS — O99612 Diseases of the digestive system complicating pregnancy, second trimester: Secondary | ICD-10-CM | POA: Insufficient documentation

## 2020-01-29 DIAGNOSIS — K219 Gastro-esophageal reflux disease without esophagitis: Secondary | ICD-10-CM | POA: Insufficient documentation

## 2020-01-29 DIAGNOSIS — O99891 Other specified diseases and conditions complicating pregnancy: Secondary | ICD-10-CM | POA: Diagnosis not present

## 2020-01-29 DIAGNOSIS — R0789 Other chest pain: Secondary | ICD-10-CM | POA: Diagnosis not present

## 2020-01-29 DIAGNOSIS — Z3A26 26 weeks gestation of pregnancy: Secondary | ICD-10-CM | POA: Diagnosis not present

## 2020-01-29 LAB — URINALYSIS, ROUTINE W REFLEX MICROSCOPIC
Bilirubin Urine: NEGATIVE
Glucose, UA: NEGATIVE mg/dL
Hgb urine dipstick: NEGATIVE
Ketones, ur: 20 mg/dL — AB
Nitrite: NEGATIVE
Protein, ur: NEGATIVE mg/dL
Specific Gravity, Urine: 1.019 (ref 1.005–1.030)
pH: 6 (ref 5.0–8.0)

## 2020-01-29 LAB — COMPREHENSIVE METABOLIC PANEL
ALT: 11 U/L (ref 0–44)
AST: 13 U/L — ABNORMAL LOW (ref 15–41)
Albumin: 3 g/dL — ABNORMAL LOW (ref 3.5–5.0)
Alkaline Phosphatase: 74 U/L (ref 38–126)
Anion gap: 11 (ref 5–15)
BUN: 6 mg/dL (ref 6–20)
CO2: 20 mmol/L — ABNORMAL LOW (ref 22–32)
Calcium: 8.9 mg/dL (ref 8.9–10.3)
Chloride: 102 mmol/L (ref 98–111)
Creatinine, Ser: 0.52 mg/dL (ref 0.44–1.00)
GFR, Estimated: >60 mL/min (ref >60)
Glucose, Bld: 76 mg/dL (ref 70–99)
Potassium: 3.6 mmol/L (ref 3.5–5.1)
Sodium: 133 mmol/L — ABNORMAL LOW (ref 135–145)
Total Bilirubin: 0.5 mg/dL (ref 0.3–1.2)
Total Protein: 6.8 g/dL (ref 6.5–8.1)

## 2020-01-29 LAB — TROPONIN I (HIGH SENSITIVITY): Troponin I (High Sensitivity): <2 ng/L (ref <18)

## 2020-01-29 MED ORDER — CALCIUM CARBONATE ANTACID 500 MG PO CHEW
2.0000 | CHEWABLE_TABLET | Freq: Once | ORAL | Status: AC
  Administered 2020-01-29: 16:00:00 400 mg via ORAL
  Filled 2020-01-29: qty 2

## 2020-01-29 MED ORDER — IOHEXOL 350 MG/ML SOLN
80.0000 mL | Freq: Once | INTRAVENOUS | Status: AC | PRN
  Administered 2020-01-29: 80 mL via INTRAVENOUS

## 2020-01-29 MED ORDER — FAMOTIDINE 20 MG PO TABS
40.0000 mg | ORAL_TABLET | Freq: Once | ORAL | Status: DC
  Filled 2020-01-29: qty 2

## 2020-01-29 MED ORDER — ACETAMINOPHEN 500 MG PO TABS
1000.0000 mg | ORAL_TABLET | Freq: Once | ORAL | Status: AC
  Administered 2020-01-29: 16:00:00 1000 mg via ORAL
  Filled 2020-01-29: qty 2

## 2020-01-29 MED ORDER — PANTOPRAZOLE SODIUM 20 MG PO TBEC: 20.0000 mg | DELAYED_RELEASE_TABLET | Freq: Every day | ORAL | 1 refills | Status: DC

## 2020-01-29 NOTE — Discharge Instructions (Signed)

## 2020-01-29 NOTE — MAU Provider Note (Signed)
History     CSN: [id number]  Arrival date and time: 01/29/20 1327   Event Date/Time   First Provider Initiated Contact with Patient 01/29/20 1503      Chief Complaint  Patient presents with  . Chest pain   HPI  Ms.Lindsay Perez is a 29 y.o. female [phone] @ [redacted]w[redacted]d here in MAU with complaints of chest pain. She has a Hx of obesity, asthma, and GERD.  The pain woke her up in the middle of the night d/t chest pain. The pain is right in the center of her chest, between her breasts. She laid really still and roughly after 30 minutes it went away. The pain worsens when she lays down, she feels shortness of breath when she is laying down only. When she sits up the pain lessens. She has never had this pain before.   She currently rates her chest pain 8/10: while laying down. She took 2 pepcid this afternoon which did not help the pain. She has no jaw pain or arm pain.   Dinner 7:00 pm: She ate beef stew that she reported to be "really salty".    OB History    Gravida  4   Para  1   Term  1   Preterm      AB  2   Living  1     SAB  2   IAB      Ectopic      Multiple  0   Live Births  1           Past Medical History:  Diagnosis Date  . Anemia    as a child  . Asthma    as a child  . Chlamydia   . GERD (gastroesophageal reflux disease)   . Headache(784.0)    migranes  . Postpartum depression   . Seasonal allergies   . Trichomonas infection     Past Surgical History:  Procedure Laterality Date  . CESAREAN SECTION N/A 10/26/2017   Procedure: CESAREAN SECTION;  Surgeon: Marlow Baars, MD;  Location: [facility] BIRTHING SUITES;  Service: Obstetrics;  Laterality: N/A;  . WISDOM TOOTH EXTRACTION      Family History  Problem Relation Age of Onset  . Drug abuse Mother   . Mental illness Brother   . Mental retardation Brother     Social History   Tobacco Use  . Smoking status: Never Smoker  . Smokeless tobacco: Never Used  Vaping Use  . Vaping Use:  Never used  Substance Use Topics  . Alcohol use: Not Currently    Alcohol/week: 19.0 standard drinks    Types: 5 Glasses of wine, 14 Shots of liquor per week  . Drug use: Not Currently    Frequency: 7.0 times per week    Types: Marijuana    Comment: last use in Jan 2019    Allergies:  Allergies  Allergen Reactions  . Shellfish Allergy Anaphylaxis    Medications Prior to Admission  Medication Sig Dispense Refill Last Dose  . albuterol (VENTOLIN HFA) 108 (90 Base) MCG/ACT inhaler Inhale 2 puffs into the lungs every 6 (six) hours as needed for wheezing or shortness of breath. 18 g 1   . polyethylene glycol powder (GLYCOLAX/MIRALAX) 17 GM/SCOOP powder Take 17 g by mouth daily. 3350 g 1   . simethicone (GAS-X) 80 MG chewable tablet Chew 1 tablet (80 mg total) by mouth every 6 (six) hours as needed (bloating). 30 tablet 0    Results  for orders placed or performed during the hospital encounter of 01/29/20 (from the past 48 hour(s))  Urinalysis, Routine w reflex microscopic Urine, Clean Catch     Status: Abnormal   Collection Time: 01/29/20  2:37 PM  Result Value Ref Range   Color, Urine YELLOW YELLOW   APPearance HAZY (A) CLEAR   Specific Gravity, Urine 1.019 1.005 - 1.030   pH 6.0 5.0 - 8.0   Glucose, UA NEGATIVE NEGATIVE mg/dL   Hgb urine dipstick NEGATIVE NEGATIVE   Bilirubin Urine NEGATIVE NEGATIVE   Ketones, ur 20 (A) NEGATIVE mg/dL   Protein, ur NEGATIVE NEGATIVE mg/dL   Nitrite NEGATIVE NEGATIVE   Leukocytes,Ua TRACE (A) NEGATIVE   RBC / HPF 0-5 0 - 5 RBC/hpf   WBC, UA 0-5 0 - 5 WBC/hpf   Bacteria, UA RARE (A) NONE SEEN   Squamous Epithelial / LPF 11-20 0 - 5   Mucus PRESENT     Comment: Performed at [facility], [address] [place], [place] [address]  Troponin I (High Sensitivity)     Status: None   Collection Time: 01/29/20  3:28 PM  Result Value Ref Range   Troponin I (High Sensitivity) <2 <18 ng/L    Comment: (NOTE) Elevated high sensitivity troponin I  (hsTnI) values and significant  changes across serial measurements may suggest ACS but many other  chronic and acute conditions are known to elevate hsTnI results.  Refer to the "Links" section for chest pain algorithms and additional  guidance. Performed at [facility], [address] [place], [place] [address]   Comprehensive metabolic panel     Status: Abnormal   Collection Time: 01/29/20  3:28 PM  Result Value Ref Range   Sodium 133 (L) 135 - 145 mmol/L   Potassium 3.6 3.5 - 5.1 mmol/L   Chloride 102 98 - 111 mmol/L   CO2 20 (L) 22 - 32 mmol/L   Glucose, Bld 76 70 - 99 mg/dL    Comment: Glucose reference range applies only to samples taken after fasting for at least 8 hours.   BUN 6 6 - 20 mg/dL   Creatinine, Ser 6.81 0.44 - 1.00 mg/dL   Calcium 8.9 8.9 - 27.5 mg/dL   Total Protein 6.8 6.5 - 8.1 g/dL   Albumin 3.0 (L) 3.5 - 5.0 g/dL   AST 13 (L) 15 - 41 U/L   ALT 11 0 - 44 U/L   Alkaline Phosphatase 74 38 - 126 U/L   Total Bilirubin 0.5 0.3 - 1.2 mg/dL   GFR, Estimated >17 >00 mL/min    Comment: (NOTE) Calculated using the CKD-EPI Creatinine Equation (2021)    Anion gap 11 5 - 15    Comment: Performed at [facility], [address] [place], [place] [address]   CT ANGIO CHEST PE W OR WO CONTRAST  Result Date: 01/29/2020 CLINICAL DATA:  Left-sided chest pain. Pregnant patient. Trimester not provided. EXAM: CT ANGIOGRAPHY CHEST WITH CONTRAST TECHNIQUE: Multidetector CT imaging of the chest was performed using the standard protocol during bolus administration of intravenous contrast. Multiplanar CT image reconstructions and MIPs were obtained to evaluate the vascular anatomy. CONTRAST:  17mL OMNIPAQUE IOHEXOL 350 MG/ML SOLN COMPARISON:  None. FINDINGS: Cardiovascular: There are no filling defects within the pulmonary arteries to suggest pulmonary embolus. Thoracic aorta is normal in caliber. No aortic dissection. The left vertebral artery arises directly from the  thoracic aorta, variant arch anatomy. Heart is normal in size.  No pericardial fluid. Mediastinum/Nodes: No mediastinal or hilar adenopathy. Decompressed esophagus. No visualized thyroid nodule. Prominent bilateral axillary lymph nodes are likely reactive. Lungs/Pleura: Clear lungs. No focal consolidation, pleural fluid, or pneumothorax. No findings of pulmonary edema. Trachea and central bronchi are patent. Upper Abdomen: No acute or unexpected finding. Musculoskeletal: There are no acute or suspicious osseous abnormalities. Review of the MIP images confirms the above findings. IMPRESSION: No pulmonary embolus or acute intrathoracic abnormality. Electronically Signed   By: Narda Rutherford M.D.   On: 01/29/2020 18:50    Review of Systems  Constitutional: Negative for fever.  Eyes: Negative for photophobia and visual disturbance.  Respiratory: Positive for shortness of breath. Negative for cough and chest tightness.   Cardiovascular: Positive for chest pain. Negative for palpitations.  Gastrointestinal: Negative for nausea and vomiting.  Genitourinary: Negative for vaginal bleeding and vaginal discharge.  Neurological: Negative for headaches.   Physical Exam   Blood pressure 125/71, pulse 89, temperature 98.4 F (36.9 C), temperature source Oral, resp. rate 20, height 5' 1.5" (1.562 m), weight 109.5 kg, SpO2 97 %, unknown if currently breastfeeding.  Physical Exam Constitutional:      General: She is not in acute distress.    Appearance: Normal appearance. She is obese. She is not ill-appearing, toxic-appearing or diaphoretic.  Cardiovascular:     Rate and Rhythm: Normal rate and regular rhythm.     Pulses: Normal pulses.     Heart sounds: Normal heart sounds.  Pulmonary:     Effort: Pulmonary effort is normal.     Breath sounds: Normal breath sounds.  Chest:     Chest wall: Tenderness present.    Skin:    General: Skin is warm.  Neurological:     Mental Status: She is alert and  oriented to person, place, and time.  Psychiatric:        Behavior: Behavior normal.    Fetal Tracing: Baseline: 130 bpm  Variability: Moderate  Accelerations: 10x10 & 15x15 Decelerations: None Toco: None  MAU Course  Procedures  None  MDM  Tylenol 1 gram & Tums 2 tablets given- No relief No acute findings on EKG- Read by Dr. [place] with Cardiology Troponin negative CT scan negative for PE or acute findings.   Assessment and Plan   A:  1. Chest pain due to GERD   2. Gastroesophageal reflux disease without esophagitis   3. [redacted] weeks gestation of pregnancy     P:  Discharge home in stable condition Rx: Protonix Return to MAU if symptoms worsen Discussed GERD diet in detail Ok to continue [place].   Duane Lope, NP 01/29/2020 8:23 PM

## 2020-01-29 NOTE — MAU Note (Signed)
Presents with c/o left sided chest pain above her breast.  Reports pain began last night and was worse then.  Reports pain constant & hurts more with activity.  Denies VB or LOF.  Endorses +FM.

## 2020-02-20 DIAGNOSIS — Z3480 Encounter for supervision of other normal pregnancy, unspecified trimester: Secondary | ICD-10-CM | POA: Diagnosis not present

## 2020-02-20 DIAGNOSIS — Z23 Encounter for immunization: Secondary | ICD-10-CM | POA: Diagnosis not present

## 2020-02-25 ENCOUNTER — Other Ambulatory Visit: Payer: Self-pay | Admitting: Obstetrics

## 2020-02-25 DIAGNOSIS — Z363 Encounter for antenatal screening for malformations: Secondary | ICD-10-CM

## 2020-03-06 ENCOUNTER — Ambulatory Visit: Payer: Medicaid Other

## 2020-03-13 ENCOUNTER — Encounter: Payer: Self-pay | Admitting: *Deleted

## 2020-03-13 ENCOUNTER — Ambulatory Visit: Payer: Medicaid Other | Admitting: *Deleted

## 2020-03-13 ENCOUNTER — Other Ambulatory Visit: Payer: Self-pay

## 2020-03-13 ENCOUNTER — Ambulatory Visit: Payer: Medicaid Other | Attending: Obstetrics

## 2020-03-13 VITALS — BP 115/68 | HR 94

## 2020-03-13 DIAGNOSIS — Z6841 Body Mass Index (BMI) 40.0 and over, adult: Secondary | ICD-10-CM | POA: Diagnosis not present

## 2020-03-13 DIAGNOSIS — Z363 Encounter for antenatal screening for malformations: Secondary | ICD-10-CM | POA: Diagnosis not present

## 2020-03-16 ENCOUNTER — Other Ambulatory Visit: Payer: Self-pay | Admitting: *Deleted

## 2020-03-16 DIAGNOSIS — Z6841 Body Mass Index (BMI) 40.0 and over, adult: Secondary | ICD-10-CM

## 2020-03-26 DIAGNOSIS — O99213 Obesity complicating pregnancy, third trimester: Secondary | ICD-10-CM | POA: Diagnosis not present

## 2020-04-01 DIAGNOSIS — O99213 Obesity complicating pregnancy, third trimester: Secondary | ICD-10-CM | POA: Diagnosis not present

## 2020-04-06 ENCOUNTER — Ambulatory Visit: Payer: Medicaid Other

## 2020-04-10 ENCOUNTER — Encounter: Payer: Self-pay | Admitting: *Deleted

## 2020-04-10 ENCOUNTER — Other Ambulatory Visit: Payer: Self-pay | Admitting: Obstetrics and Gynecology

## 2020-04-10 ENCOUNTER — Ambulatory Visit: Payer: Medicaid Other | Attending: Obstetrics and Gynecology

## 2020-04-10 ENCOUNTER — Other Ambulatory Visit: Payer: Self-pay

## 2020-04-10 ENCOUNTER — Ambulatory Visit: Payer: Medicaid Other | Admitting: *Deleted

## 2020-04-10 DIAGNOSIS — E669 Obesity, unspecified: Secondary | ICD-10-CM

## 2020-04-10 DIAGNOSIS — O99213 Obesity complicating pregnancy, third trimester: Secondary | ICD-10-CM | POA: Diagnosis not present

## 2020-04-10 DIAGNOSIS — Z6841 Body Mass Index (BMI) 40.0 and over, adult: Secondary | ICD-10-CM

## 2020-04-10 DIAGNOSIS — O34219 Maternal care for unspecified type scar from previous cesarean delivery: Secondary | ICD-10-CM

## 2020-04-10 DIAGNOSIS — O365930 Maternal care for other known or suspected poor fetal growth, third trimester, not applicable or unspecified: Secondary | ICD-10-CM | POA: Diagnosis not present

## 2020-04-10 DIAGNOSIS — Z8759 Personal history of other complications of pregnancy, childbirth and the puerperium: Secondary | ICD-10-CM | POA: Diagnosis not present

## 2020-04-10 DIAGNOSIS — Z3A36 36 weeks gestation of pregnancy: Secondary | ICD-10-CM

## 2020-04-16 DIAGNOSIS — O99213 Obesity complicating pregnancy, third trimester: Secondary | ICD-10-CM | POA: Diagnosis not present

## 2020-04-16 DIAGNOSIS — Z369 Encounter for antenatal screening, unspecified: Secondary | ICD-10-CM | POA: Diagnosis not present

## 2020-04-16 DIAGNOSIS — Z3480 Encounter for supervision of other normal pregnancy, unspecified trimester: Secondary | ICD-10-CM | POA: Diagnosis not present

## 2020-04-21 ENCOUNTER — Encounter (HOSPITAL_COMMUNITY): Payer: Self-pay

## 2020-04-21 NOTE — Patient Instructions (Signed)
Lindsay Perez  04/21/2020   Your procedure is scheduled on:  04/28/2020  Arrive at 0930 at Entrance C on [employer] at [facility]  and [employer]. You are invited to use the FREE valet parking or use the Visitor's parking deck.  Pick up the phone at the desk and dial [phone].  Call this number if you have problems the morning of surgery: [phone]  Remember:   Do not eat food:(After Midnight) Desps de medianoche.  Do not drink clear liquids: (After Midnight) Desps de medianoche.  Take these medicines the morning of surgery with A SIP OF WATER:  none   Do not wear jewelry, make-up or nail polish.  Do not wear lotions, powders, or perfumes. Do not wear deodorant.  Do not shave 48 hours prior to surgery.  Do not bring valuables to the hospital.  [facility] is not   responsible for any belongings or valuables brought to the hospital.  Contacts, dentures or bridgework may not be worn into surgery.  Leave suitcase in the car. After surgery it may be brought to your room.  For patients admitted to the hospital, checkout time is 11:00 AM the day of              discharge.      Please read over the following fact sheets that you were given:     Preparing for Surgery

## 2020-04-24 DIAGNOSIS — Z6841 Body Mass Index (BMI) 40.0 and over, adult: Secondary | ICD-10-CM | POA: Diagnosis not present

## 2020-04-27 ENCOUNTER — Other Ambulatory Visit: Payer: Self-pay

## 2020-04-27 ENCOUNTER — Encounter (HOSPITAL_COMMUNITY)
Admission: RE | Admit: 2020-04-27 | Discharge: 2020-04-27 | Disposition: A | Discharge status: Home / Self Care | Payer: Medicaid Other | Source: Ambulatory Visit | Attending: Obstetrics | Admitting: Obstetrics

## 2020-04-27 ENCOUNTER — Other Ambulatory Visit (HOSPITAL_COMMUNITY)
Admission: RE | Admit: 2020-04-27 | Discharge: 2020-04-27 | Disposition: A | Discharge status: Home / Self Care | Payer: Medicaid Other | Source: Ambulatory Visit | Attending: Obstetrics | Admitting: Obstetrics

## 2020-04-27 DIAGNOSIS — Z20822 Contact with and (suspected) exposure to covid-19: Secondary | ICD-10-CM | POA: Insufficient documentation

## 2020-04-27 DIAGNOSIS — Z01812 Encounter for preprocedural laboratory examination: Secondary | ICD-10-CM | POA: Insufficient documentation

## 2020-04-27 LAB — CBC
HCT: 32.3 % — ABNORMAL LOW (ref 36.0–46.0)
Hemoglobin: 10.5 g/dL — ABNORMAL LOW (ref 12.0–15.0)
MCH: 25.2 pg — ABNORMAL LOW (ref 26.0–34.0)
MCHC: 32.5 g/dL (ref 30.0–36.0)
MCV: 77.5 fL — ABNORMAL LOW (ref 80.0–100.0)
Platelets: 301 10*3/uL (ref 150–400)
RBC: 4.17 MIL/uL (ref 3.87–5.11)
RDW: 16 % — ABNORMAL HIGH (ref 11.5–15.5)
WBC: 7.5 10*3/uL (ref 4.0–10.5)
nRBC: 0 % (ref 0.0–0.2)

## 2020-04-27 LAB — RPR: RPR Ser Ql: NONREACTIVE

## 2020-04-27 LAB — SARS CORONAVIRUS 2 (TAT 6-24 HRS): SARS Coronavirus 2: NEGATIVE

## 2020-04-28 ENCOUNTER — Inpatient Hospital Stay (HOSPITAL_COMMUNITY): Payer: Medicaid Other

## 2020-04-28 ENCOUNTER — Other Ambulatory Visit: Payer: Self-pay

## 2020-04-28 ENCOUNTER — Inpatient Hospital Stay (HOSPITAL_COMMUNITY)
Admission: RE | Admit: 2020-04-28 | Discharge: 2020-04-30 | DRG: 784 | Disposition: A | Discharge status: Home / Self Care | Payer: Medicaid Other | Attending: Obstetrics | Admitting: Obstetrics

## 2020-04-28 ENCOUNTER — Encounter (HOSPITAL_COMMUNITY): Payer: Self-pay | Admitting: Obstetrics

## 2020-04-28 ENCOUNTER — Encounter (HOSPITAL_COMMUNITY)
Admission: RE | Disposition: A | Discharge status: Home / Self Care | Payer: Self-pay | Source: Home / Self Care | Attending: Obstetrics

## 2020-04-28 DIAGNOSIS — O99214 Obesity complicating childbirth: Secondary | ICD-10-CM | POA: Diagnosis present

## 2020-04-28 DIAGNOSIS — Z20822 Contact with and (suspected) exposure to covid-19: Secondary | ICD-10-CM | POA: Diagnosis not present

## 2020-04-28 DIAGNOSIS — O34211 Maternal care for low transverse scar from previous cesarean delivery: Principal | ICD-10-CM | POA: Diagnosis present

## 2020-04-28 DIAGNOSIS — Z302 Encounter for sterilization: Secondary | ICD-10-CM

## 2020-04-28 DIAGNOSIS — O9081 Anemia of the puerperium: Secondary | ICD-10-CM | POA: Diagnosis not present

## 2020-04-28 DIAGNOSIS — Z3A39 39 weeks gestation of pregnancy: Secondary | ICD-10-CM | POA: Diagnosis not present

## 2020-04-28 DIAGNOSIS — D62 Acute posthemorrhagic anemia: Secondary | ICD-10-CM | POA: Diagnosis not present

## 2020-04-28 DIAGNOSIS — E669 Obesity, unspecified: Secondary | ICD-10-CM

## 2020-04-28 DIAGNOSIS — Z98891 History of uterine scar from previous surgery: Secondary | ICD-10-CM | POA: Diagnosis not present

## 2020-04-28 DIAGNOSIS — O34219 Maternal care for unspecified type scar from previous cesarean delivery: Secondary | ICD-10-CM | POA: Diagnosis not present

## 2020-04-28 LAB — TYPE AND SCREEN
ABO/RH(D): O POS
Antibody Screen: NEGATIVE

## 2020-04-28 SURGERY — Surgical Case
Anesthesia: Spinal | Laterality: Bilateral

## 2020-04-28 MED ORDER — NALOXONE HCL 0.4 MG/ML IJ SOLN: 0.4000 mg | INTRAMUSCULAR | Status: DC | PRN

## 2020-04-28 MED ORDER — DIPHENHYDRAMINE HCL 25 MG PO CAPS: 25.0000 mg | ORAL_CAPSULE | ORAL | Status: DC | PRN

## 2020-04-28 MED ORDER — PHENYLEPHRINE HCL-NACL 20-0.9 MG/250ML-% IV SOLN
INTRAVENOUS | Status: AC
  Filled 2020-04-28: qty 250

## 2020-04-28 MED ORDER — KETOROLAC TROMETHAMINE 30 MG/ML IJ SOLN
30.0000 mg | Freq: Four times a day (QID) | INTRAMUSCULAR | Status: AC
  Administered 2020-04-28 – 2020-04-29 (×3): 30 mg via INTRAVENOUS
  Filled 2020-04-28 (×3): qty 1

## 2020-04-28 MED ORDER — MENTHOL 3 MG MT LOZG: 1.0000 | LOZENGE | OROMUCOSAL | Status: DC | PRN

## 2020-04-28 MED ORDER — LACTATED RINGERS IV SOLN: INTRAVENOUS | Status: DC

## 2020-04-28 MED ORDER — DEXTROSE 5 % IV SOLN: 3.0000 g | INTRAVENOUS | Status: DC

## 2020-04-28 MED ORDER — BUPIVACAINE IN DEXTROSE 0.75-8.25 % IT SOLN
INTRATHECAL | Status: DC | PRN
  Administered 2020-04-28: 1.4 mL via INTRATHECAL

## 2020-04-28 MED ORDER — DIPHENHYDRAMINE HCL 25 MG PO CAPS
25.0000 mg | ORAL_CAPSULE | Freq: Four times a day (QID) | ORAL | Status: DC | PRN
Start: 2020-04-28 — End: 2020-04-30

## 2020-04-28 MED ORDER — OXYTOCIN-SODIUM CHLORIDE 30-0.9 UT/500ML-% IV SOLN: 2.5000 [IU]/h | INTRAVENOUS | Status: AC

## 2020-04-28 MED ORDER — DEXAMETHASONE SODIUM PHOSPHATE 4 MG/ML IJ SOLN
INTRAMUSCULAR | Status: AC
  Filled 2020-04-28: qty 1

## 2020-04-28 MED ORDER — NALBUPHINE HCL 10 MG/ML IJ SOLN
5.0000 mg | Freq: Once | INTRAMUSCULAR | Status: DC | PRN
Start: 2020-04-28 — End: 2020-04-30

## 2020-04-28 MED ORDER — OXYCODONE HCL 5 MG PO TABS
5.0000 mg | ORAL_TABLET | ORAL | Status: DC | PRN
  Administered 2020-04-29: 10 mg via ORAL
  Administered 2020-04-29: 5 mg via ORAL
  Filled 2020-04-28: qty 1
  Filled 2020-04-28: qty 2

## 2020-04-28 MED ORDER — WITCH HAZEL-GLYCERIN EX PADS: 1.0000 "application " | MEDICATED_PAD | CUTANEOUS | Status: DC | PRN

## 2020-04-28 MED ORDER — ACETAMINOPHEN 500 MG PO TABS
1000.0000 mg | ORAL_TABLET | Freq: Four times a day (QID) | ORAL | Status: DC
  Administered 2020-04-28 – 2020-04-30 (×8): 1000 mg via ORAL
  Filled 2020-04-28 (×8): qty 2

## 2020-04-28 MED ORDER — FENTANYL CITRATE (PF) 100 MCG/2ML IJ SOLN
INTRAMUSCULAR | Status: DC | PRN
  Administered 2020-04-28: 15 ug via INTRATHECAL

## 2020-04-28 MED ORDER — SENNOSIDES-DOCUSATE SODIUM 8.6-50 MG PO TABS
2.0000 | ORAL_TABLET | ORAL | Status: DC
  Administered 2020-04-29: 2 via ORAL
  Filled 2020-04-28 (×2): qty 2

## 2020-04-28 MED ORDER — NALBUPHINE HCL 10 MG/ML IJ SOLN: 5.0000 mg | Freq: Once | INTRAMUSCULAR | Status: DC | PRN

## 2020-04-28 MED ORDER — SIMETHICONE 80 MG PO CHEW
80.0000 mg | CHEWABLE_TABLET | Freq: Three times a day (TID) | ORAL | Status: DC
  Administered 2020-04-28 – 2020-04-30 (×5): 80 mg via ORAL
  Filled 2020-04-28 (×5): qty 1

## 2020-04-28 MED ORDER — POVIDONE-IODINE 10 % EX SWAB
2.0000 "application " | Freq: Once | CUTANEOUS | Status: AC
  Administered 2020-04-28: 2 via TOPICAL

## 2020-04-28 MED ORDER — PHENYLEPHRINE HCL-NACL 20-0.9 MG/250ML-% IV SOLN
INTRAVENOUS | Status: DC | PRN
  Administered 2020-04-28: 60 ug/min via INTRAVENOUS

## 2020-04-28 MED ORDER — CEFAZOLIN SODIUM-DEXTROSE 2-3 GM-%(50ML) IV SOLR
INTRAVENOUS | Status: DC | PRN
  Administered 2020-04-28: 2 g via INTRAVENOUS

## 2020-04-28 MED ORDER — METOCLOPRAMIDE HCL 5 MG/ML IJ SOLN
INTRAMUSCULAR | Status: DC | PRN
  Administered 2020-04-28 (×2): 5 mg via INTRAVENOUS

## 2020-04-28 MED ORDER — OXYTOCIN-SODIUM CHLORIDE 30-0.9 UT/500ML-% IV SOLN
INTRAVENOUS | Status: DC | PRN
  Administered 2020-04-28: 300 mL via INTRAVENOUS

## 2020-04-28 MED ORDER — KETOROLAC TROMETHAMINE 30 MG/ML IJ SOLN
INTRAMUSCULAR | Status: AC
  Filled 2020-04-28: qty 1

## 2020-04-28 MED ORDER — SCOPOLAMINE 1 MG/3DAYS TD PT72
1.0000 | MEDICATED_PATCH | Freq: Once | TRANSDERMAL | Status: DC
  Administered 2020-04-28: 1.5 mg via TRANSDERMAL

## 2020-04-28 MED ORDER — MORPHINE SULFATE (PF) 0.5 MG/ML IJ SOLN
INTRAMUSCULAR | Status: AC
  Filled 2020-04-28: qty 10

## 2020-04-28 MED ORDER — PRENATAL MULTIVITAMIN CH
1.0000 | ORAL_TABLET | Freq: Every day | ORAL | Status: DC
  Administered 2020-04-29 – 2020-04-30 (×2): 1 via ORAL
  Filled 2020-04-28 (×2): qty 1

## 2020-04-28 MED ORDER — DEXAMETHASONE SODIUM PHOSPHATE 4 MG/ML IJ SOLN
INTRAMUSCULAR | Status: DC | PRN
  Administered 2020-04-28: 4 mg via INTRAVENOUS

## 2020-04-28 MED ORDER — COCONUT OIL OIL
1.0000 | TOPICAL_OIL | Status: DC | PRN
Start: 2020-04-28 — End: 2020-04-30
  Administered 2020-04-28: 1 via TOPICAL

## 2020-04-28 MED ORDER — ONDANSETRON HCL 4 MG/2ML IJ SOLN
INTRAMUSCULAR | Status: DC | PRN
  Administered 2020-04-28: 4 mg via INTRAVENOUS

## 2020-04-28 MED ORDER — ONDANSETRON HCL 4 MG/2ML IJ SOLN: 4.0000 mg | Freq: Three times a day (TID) | INTRAMUSCULAR | Status: DC | PRN

## 2020-04-28 MED ORDER — DIBUCAINE (PERIANAL) 1 % EX OINT: 1.0000 "application " | TOPICAL_OINTMENT | CUTANEOUS | Status: DC | PRN

## 2020-04-28 MED ORDER — KETOROLAC TROMETHAMINE 30 MG/ML IJ SOLN
30.0000 mg | Freq: Four times a day (QID) | INTRAMUSCULAR | Status: AC | PRN
  Administered 2020-04-28: 30 mg via INTRAVENOUS

## 2020-04-28 MED ORDER — ONDANSETRON HCL 4 MG/2ML IJ SOLN
INTRAMUSCULAR | Status: AC
  Filled 2020-04-28: qty 2

## 2020-04-28 MED ORDER — KETOROLAC TROMETHAMINE 30 MG/ML IJ SOLN: 30.0000 mg | Freq: Four times a day (QID) | INTRAMUSCULAR | Status: AC | PRN

## 2020-04-28 MED ORDER — NALBUPHINE HCL 10 MG/ML IJ SOLN
5.0000 mg | INTRAMUSCULAR | Status: DC | PRN
  Administered 2020-04-28: 5 mg via SUBCUTANEOUS
  Filled 2020-04-28: qty 1

## 2020-04-28 MED ORDER — SCOPOLAMINE 1 MG/3DAYS TD PT72
MEDICATED_PATCH | TRANSDERMAL | Status: AC
  Filled 2020-04-28: qty 1

## 2020-04-28 MED ORDER — DIPHENHYDRAMINE HCL 50 MG/ML IJ SOLN: 12.5000 mg | INTRAMUSCULAR | Status: DC | PRN

## 2020-04-28 MED ORDER — IBUPROFEN 800 MG PO TABS
800.0000 mg | ORAL_TABLET | Freq: Four times a day (QID) | ORAL | Status: DC
  Administered 2020-04-29 – 2020-04-30 (×5): 800 mg via ORAL
  Filled 2020-04-28 (×5): qty 1

## 2020-04-28 MED ORDER — OXYTOCIN-SODIUM CHLORIDE 30-0.9 UT/500ML-% IV SOLN: INTRAVENOUS | Status: DC | PRN

## 2020-04-28 MED ORDER — MORPHINE SULFATE (PF) 0.5 MG/ML IJ SOLN
INTRAMUSCULAR | Status: DC | PRN
  Administered 2020-04-28: 150 ug via INTRATHECAL

## 2020-04-28 MED ORDER — FENTANYL CITRATE (PF) 100 MCG/2ML IJ SOLN
INTRAMUSCULAR | Status: AC
  Filled 2020-04-28: qty 2

## 2020-04-28 MED ORDER — LACTATED RINGERS IV SOLN: INTRAVENOUS | Status: DC | PRN

## 2020-04-28 MED ORDER — SIMETHICONE 80 MG PO CHEW: 80.0000 mg | CHEWABLE_TABLET | ORAL | Status: DC | PRN

## 2020-04-28 MED ORDER — SODIUM CHLORIDE 0.9% FLUSH: 3.0000 mL | INTRAVENOUS | Status: DC | PRN

## 2020-04-28 MED ORDER — NALOXONE HCL 4 MG/10ML IJ SOLN
1.0000 ug/kg/h | INTRAVENOUS | Status: DC | PRN
  Filled 2020-04-28: qty 5

## 2020-04-28 MED ORDER — PHENYLEPHRINE HCL (PRESSORS) 10 MG/ML IV SOLN
INTRAVENOUS | Status: DC | PRN
  Administered 2020-04-28: 120 ug via INTRAVENOUS

## 2020-04-28 MED ORDER — NALBUPHINE HCL 10 MG/ML IJ SOLN
5.0000 mg | INTRAMUSCULAR | Status: DC | PRN
Start: 2020-04-28 — End: 2020-04-30

## 2020-04-28 MED ORDER — CEFAZOLIN SODIUM-DEXTROSE 2-4 GM/100ML-% IV SOLN
INTRAVENOUS | Status: AC
  Filled 2020-04-28: qty 100

## 2020-04-28 SURGICAL SUPPLY — 37 items
BENZOIN TINCTURE PRP APPL 2/3 (GAUZE/BANDAGES/DRESSINGS) ×2 IMPLANT
CLAMP CORD UMBIL (MISCELLANEOUS) IMPLANT
CLOTH BEACON ORANGE TIMEOUT ST (SAFETY) ×2 IMPLANT
DRSG OPSITE POSTOP 4X10 (GAUZE/BANDAGES/DRESSINGS) ×2 IMPLANT
ELECT REM PT RETURN 9FT ADLT (ELECTROSURGICAL) ×2
ELECTRODE REM PT RTRN 9FT ADLT (ELECTROSURGICAL) ×1 IMPLANT
EXTRACTOR VACUUM KIWI (MISCELLANEOUS) IMPLANT
GLOVE BIOGEL PI IND STRL 6.5 (GLOVE) ×1 IMPLANT
GLOVE BIOGEL PI IND STRL 7.0 (GLOVE) ×1 IMPLANT
GLOVE BIOGEL PI INDICATOR 6.5 (GLOVE) ×1
GLOVE BIOGEL PI INDICATOR 7.0 (GLOVE) ×1
GLOVE ECLIPSE 6.0 STRL STRAW (GLOVE) ×2 IMPLANT
GOWN STRL REUS W/TWL LRG LVL3 (GOWN DISPOSABLE) ×4 IMPLANT
HOVERMATT SINGLE USE (MISCELLANEOUS) ×2 IMPLANT
KIT ABG SYR 3ML LUER SLIP (SYRINGE) IMPLANT
NEEDLE HYPO 25X5/8 SAFETYGLIDE (NEEDLE) IMPLANT
NS IRRIG 1000ML POUR BTL (IV SOLUTION) ×2 IMPLANT
PACK C SECTION WH (CUSTOM PROCEDURE TRAY) ×2 IMPLANT
PAD OB MATERNITY 4.3X12.25 (PERSONAL CARE ITEMS) ×2 IMPLANT
PENCIL SMOKE EVAC W/HOLSTER (ELECTROSURGICAL) ×2 IMPLANT
RETRACTOR TRAXI PANNICULUS (MISCELLANEOUS) ×1 IMPLANT
RTRCTR C-SECT PINK 25CM LRG (MISCELLANEOUS) ×2 IMPLANT
STRIP CLOSURE SKIN 1/2X4 (GAUZE/BANDAGES/DRESSINGS) ×2 IMPLANT
SUT MNCRL 0 VIOLET CTX 36 (SUTURE) ×2 IMPLANT
SUT MNCRL AB 3-0 PS2 27 (SUTURE) ×2 IMPLANT
SUT MONOCRYL 0 CTX 36 (SUTURE) ×2
SUT PLAIN 0 NONE (SUTURE) IMPLANT
SUT PLAIN 2 0 (SUTURE) ×1
SUT PLAIN ABS 2-0 CT1 27XMFL (SUTURE) ×1 IMPLANT
SUT VIC AB 0 CTX 36 (SUTURE) ×2
SUT VIC AB 0 CTX36XBRD ANBCTRL (SUTURE) ×2 IMPLANT
SUT VIC AB 2-0 CT1 27 (SUTURE) ×1
SUT VIC AB 2-0 CT1 TAPERPNT 27 (SUTURE) ×1 IMPLANT
TOWEL OR 17X24 6PK STRL BLUE (TOWEL DISPOSABLE) ×2 IMPLANT
TRAXI PANNICULUS RETRACTOR (MISCELLANEOUS) ×1
TRAY FOLEY W/BAG SLVR 14FR LF (SET/KITS/TRAYS/PACK) ×2 IMPLANT
WATER STERILE IRR 1000ML POUR (IV SOLUTION) ×2 IMPLANT

## 2020-04-28 NOTE — Op Note (Signed)
Cesarean Section Procedure Note  Pre-operative Diagnosis: 1. Intrauterine pregnancy at [redacted]w[redacted]d  2. History of cesarean section, desires repeat  3. Desires permanent sterilization    Post-operative Diagnosis: same as above  Surgeon: Marlow Baars, MD  Assistants: Carrington Clamp, MD  Procedure: Repeat low transverse cesarean section with parkland bilateral tubal ligation  Anesthesia: Spinal anesthesia  Estimated Blood Loss: 275 mL         Drains: Foley catheter         Specimens: placenta to L&D                Complications:  None; patient tolerated the procedure well.         Disposition: PACU - hemodynamically stable.  Findings:  Normal uterus, tubes and ovaries bilaterally.  Viable female infant, 2645g (5lb 13.3oz) Apgars 8, 9.    Procedure Details   After spinal anesthesia was found to adequate , the patient was placed in the dorsal supine position with a leftward tilt, prepped and draped in the usual sterile manner. A Pfannenstiel incision was made and carried down through the subcutaneous tissue to the fascia.  The fascia was incised in the midline and the fascial incision was extended laterally with Mayo scissors. The superior aspect of the fascial incision was grasped with two Kocher clamp, tented up and the rectus muscles dissected off with sharp dissection due to moderate adhesions of the fascia to the rectus muscle. The rectus was then dissected off with blunt dissection and Mayo scissors inferiorly. The rectus muscles were separated in the midline. The abdominal peritoneum was identified, tented up, entered sharply, and the incision was extended superiorly and inferiorly with good visualization of the bladder. The vesicouterine peritoneum was identified, tented up, entered bluntly.  The Alexis retractor was deployed and the bladder flap was created with Metzenbaum scissors. Scalpel was then used to make a low transverse incision on the uterus which was extended laterally with blunt  dissection and then bandage scissors at the left aspect of the hysterotomy. The fetal vertex was identified, elevated out of the pelvis and brought to the hysterotomy.  The head was delivered easily followed by the shoulders and body.  After a 60 second delay per protocol, the cord was clamped and cut and the infant was passed to the waiting neonatologist.  The placenta was then delivered spontaneously, intact and appear normal, the uterus was cleared of all clot and debris  The hysterotomy was repaired with #0 Monocryl in running locked fashion.    At this time, attention was turned to the left fallopian tube.  The tube was grasped with two babcock clamps.  An avascular segment of the mesosalpinx was identified, and a parkland tubal ligation was performed in the usual fashion with #0 plain gut suture.  Tubal ostia were identified on both remaining sides of the tube.  This was repeated with the right fallopian tube. The portion of the left and right fallopian tubes were sent to pathology.  The hysterotomy was reexamined and excellent hemostasis was noted.  The abdominal cavity was cleared of all clot and debris.  The Alexis retractor was removed from the abdomen. The fascia and rectus muscles were inspected and were hemostatic.  The peritoneum was closed with 2-0 vicryl. The fascia was closed with 0 Vicryl in a running fashion. The subcuticular layer was irrigated and all bleeders cauterized.  The subcutaneous layer was re approximated with interrupted 2-0 plain gut.  The skin was closed with 4-0 monocryl in  a subcuticular fashion. The incision was dressed with benzoine, steri strips and pressure dressing. All sponge lap and needle counts were correct x3. Patient tolerated the procedure well and recovered in stable condition following the procedure.

## 2020-04-28 NOTE — H&P (Signed)
30 y.o. G3T5176 @ [redacted]w[redacted]d presents for repeat cesarean section.  Otherwise has good fetal movement and no bleeding.  Pregnancy complicated by:  1. History of cesarean section for non-reassuring fetal status and protracted active phase.  Declines TOLAC.  Desires RCS 2. Maternal obesity, class 3.  Prepregnancy BMI 44.  Past Medical History:  Diagnosis Date  . Anemia    as a child  . Asthma    as a child  . Chlamydia   . GERD (gastroesophageal reflux disease)   . Headache(784.0)    migranes  . Seasonal allergies   . Trichomonas infection     Past Surgical History:  Procedure Laterality Date  . CESAREAN SECTION N/A 10/26/2017   Procedure: CESAREAN SECTION;  Surgeon: Marlow Baars, MD;  Location: [facility] BIRTHING SUITES;  Service: Obstetrics;  Laterality: N/A;  . WISDOM TOOTH EXTRACTION      OB History  Gravida Para Term Preterm AB Living  4 1 1   2 1   SAB IAB Ectopic Multiple Live Births  2     0 1    # Outcome Date GA Lbr Len/2nd Weight Sex Delivery Anes PTL Lv  4 Current           3 SAB 03/24/19          2 Term 10/26/17 [redacted]w[redacted]d  2645 g M CS-LTranv EPI  LIV  1 SAB             Social History   Socioeconomic History  . Marital status: Single    Spouse name: Not on file  . Number of children: 0   . Years of education: some colle  . Highest education level: Not on file  Occupational History  . Occupation: does many duties     Comment: [employer]   Tobacco Use  . Smoking status: Never Smoker  . Smokeless tobacco: Never Used  Vaping Use  . Vaping Use: Never used  Substance and Sexual Activity  . Alcohol use: Not Currently    Alcohol/week: 19.0 standard drinks    Types: 5 Glasses of wine, 14 Shots of liquor per week  . Drug use: Not Currently    Frequency: 7.0 times per week    Types: Marijuana    Comment: last use in Jan 2019  . Sexual activity: Yes  Other Topics Concern  . Not on file  Social History Narrative   Lives with nephew (22).    Some college,  starting back in January.   [name], age 42.        Social Determinants of Health   Financial Resource Strain: Not on file  Food Insecurity: Not on file  Transportation Needs: Not on file  Physical Activity: Not on file  Stress: Not on file  Social Connections: Not on file  Intimate Partner Violence: Not on file   Shellfish allergy    Prenatal Transfer Tool  Maternal Diabetes: No Genetic Screening: Normal Maternal Ultrasounds/Referrals: Normal Fetal Ultrasounds or other Referrals:  None Maternal Substance Abuse:  No Significant Maternal Medications:  Meds include: Other:  pantoprazole Significant Maternal Lab Results: Group B Strep negative  ABO, Rh: --/--/O POS (03/14 0954) Antibody: NEG (03/14 0954) Rubella: Immune (09/02 0000) RPR: NON REACTIVE (03/14 0937)  HBsAg: Negative (09/02 0000)  HIV: Non-reactive (09/02 0000)  GBS:   Negative     Vitals:   04/28/20 0949  BP: 128/84  Pulse: 89  Resp: 18  Temp: 98.4 F (36.9 C)  SpO2:  99%     General:  NAD Abdomen:  soft, gravid Ex:  +SCDs FHTs:  134    A/P   30 y.o. T0B3112 [redacted]w[redacted]d presents for repeat cesarean section and bilateral tubal ligation.  Discussed risks of cesarean section to include, but not limited to, infection, bleeding, damage to surrounding strutcures (including bowel, bladder, tubes, ovaries, nerves, vessels, baby), need for additional procedures, risk of blood clot, need for transfusion, tubal failure resulting in pregnancy, including risk of ectopic pregnancy, risk of regret. Consent signed  Ancef 2gm on call to OR  [facility] GEFFEL Chestine Spore

## 2020-04-28 NOTE — Anesthesia Preprocedure Evaluation (Addendum)
Anesthesia Evaluation  Patient identified by MRN, date of birth, ID band Patient awake    Reviewed: Allergy & Precautions, NPO status , Patient's Chart, lab work & pertinent test results  Airway Mallampati: I  TM Distance: >3 FB Neck ROM: Full    Dental no notable dental hx. (+) Teeth Intact, Missing,    Pulmonary asthma ,    Pulmonary exam normal breath sounds clear to auscultation       Cardiovascular negative cardio ROS Normal cardiovascular exam Rhythm:Regular Rate:Normal     Neuro/Psych  Headaches, negative psych ROS   GI/Hepatic Neg liver ROS, GERD  Medicated,  Endo/Other  Morbid obesity  Renal/GU negative Renal ROS  negative genitourinary   Musculoskeletal negative musculoskeletal ROS (+)   Abdominal (+) + obese,   Peds negative pediatric ROS (+)  Hematology  (+) Blood dyscrasia, anemia ,   Anesthesia Other Findings   Reproductive/Obstetrics (+) Pregnancy                            Anesthesia Physical  Anesthesia Plan  ASA: III  Anesthesia Plan: Spinal   Post-op Pain Management:    Induction:   PONV Risk Score and Plan: 2 and Scopolamine patch - Pre-op  Airway Management Planned: Natural Airway  Additional Equipment: None  Intra-op Plan:   Post-operative Plan:   Informed Consent:     Dental advisory given  Plan Discussed with: Anesthesiologist  Anesthesia Plan Comments: (  )       Anesthesia Quick Evaluation

## 2020-04-28 NOTE — Transfer of Care (Signed)
Immediate Anesthesia Transfer of Care Note  Patient: Lindsay Perez  Procedure(s) Performed: REPEAT CESAREAN SECTION WITH BILATERAL TUBAL LIGATION (Bilateral )  Patient Location: PACU  Anesthesia Type:Spinal  Level of Consciousness: awake, alert  and oriented  Airway & Oxygen Therapy: Patient Spontanous Breathing  Post-op Assessment: Report given to RN and Post -op Vital signs reviewed and stable  Post vital signs: Reviewed and stable  Last Vitals:  Vitals Value Taken Time  BP    Temp    Pulse 78 04/28/20 1253  Resp 12 04/28/20 1253  SpO2 100 % 04/28/20 1253  Vitals shown include unvalidated device data.  Last Pain: There were no vitals filed for this visit.       Complications: No complications documented.

## 2020-04-28 NOTE — Anesthesia Procedure Notes (Signed)
Spinal  Patient location during procedure: OR Start time: 04/28/2020 11:36 AM End time: 04/28/2020 11:40 AM Staffing Anesthesiologist: Bethena Midget, MD Preanesthetic Checklist Completed: patient identified, IV checked, site marked, risks and benefits discussed, surgical consent, monitors and equipment checked, pre-op evaluation and timeout performed Spinal Block Patient position: sitting Prep: DuraPrep Patient monitoring: heart rate, cardiac monitor, continuous pulse ox and blood pressure Approach: midline Location: L3-4 Injection technique: single-shot Needle Needle type: Sprotte  Needle gauge: 24 G Needle length: 9 cm Assessment Sensory level: T4

## 2020-04-28 NOTE — Anesthesia Postprocedure Evaluation (Signed)
Anesthesia Post Note  Patient: Mande Auvil  Procedure(s) Performed: REPEAT CESAREAN SECTION WITH BILATERAL TUBAL LIGATION (Bilateral )     Patient location during evaluation: PACU Anesthesia Type: Spinal Level of consciousness: oriented and awake and alert Pain management: pain level controlled Vital Signs Assessment: post-procedure vital signs reviewed and stable Respiratory status: spontaneous breathing, respiratory function stable and patient connected to nasal cannula oxygen Cardiovascular status: blood pressure returned to baseline and stable Postop Assessment: no headache, no backache and no apparent nausea or vomiting Anesthetic complications: no   No complications documented.  Last Vitals:  Vitals:   04/28/20 1345 04/28/20 1357  BP: 127/72 115/75  Pulse: 75 75  Resp: 20 18  Temp: 36.7 C 36.5 C  SpO2: 99% 99%    Last Pain:  Vitals:   04/28/20 1357  TempSrc: Bladder  PainSc:                  Annalisa Colonna

## 2020-04-28 NOTE — Lactation Note (Signed)
This note was copied from a baby's chart. Lactation Consultation Note  Patient Name: Lindsay Perez [id number] Date: 04/28/2020 Reason for consult: Initial assessment;Term;Infant < 6lbs;Other (Comment) (P 2 , C/S, experienced BF- 14 months, mom encouraged to call with feeding cues.) Age:29 hours  Mom resting in bed , grandmother holding the baby which is asleep.  LC reviewed hand expressing and mom able to express 1/2 ml.  LC set up the DEBP , checked the sizing on the flange - #27 F more comfortable.  LC recommended post pumping after feedings later this evening and continue to hane express.  Due to areolas being dry, LC asked the [facility] to provide coconut oil. Mom aware to use just a dab of coconut oil.  LC provided the [facility] brochure with resources.   Maternal Data Has patient been taught Hand Expression?: Yes Does the patient have breastfeeding experience prior to this delivery?: Yes How long did the patient breastfeed?: per mom 1st baby breast fed for 14 months.  Feeding Mother's Current Feeding Choice: Breast Milk and Formula  LATCH Score ( Latch Score by the [facility] ) at 1427  Latch: Grasps breast easily, tongue down, lips flanged, rhythmical sucking.  Audible Swallowing: A few with stimulation  Type of Nipple: Everted at rest and after stimulation  Comfort (Breast/Nipple): Soft / non-tender  Hold (Positioning): Assistance needed to correctly position infant at breast and maintain latch.  LATCH Score: 8   Lactation Tools Discussed/Used Tools: Pump;Flanges Flange Size: 24;27 (#24 snug - switched to the #27 F and per mom more comfortable. LC checked and appeared to be a better fit. areola compressible) Breast pump type: Double-Electric Breast Pump Pump Education: Setup, frequency, and cleaning;Milk Storage Reason for Pumping: baby less than Pumped volume: 0.5 mL  Interventions Interventions: Breast feeding basics reviewed;Hand express;DEBP;Education  Discharge Pump:  Personal  Consult Status Consult Status: Follow-up Date: 04/28/20 Follow-up type: In-patient    [name] 04/28/2020, 5:29 PM

## 2020-04-29 ENCOUNTER — Encounter (HOSPITAL_COMMUNITY): Payer: Self-pay | Admitting: Obstetrics

## 2020-04-29 LAB — CBC
HCT: 29 % — ABNORMAL LOW (ref 36.0–46.0)
Hemoglobin: 9.3 g/dL — ABNORMAL LOW (ref 12.0–15.0)
MCH: 25.1 pg — ABNORMAL LOW (ref 26.0–34.0)
MCHC: 32.1 g/dL (ref 30.0–36.0)
MCV: 78.2 fL — ABNORMAL LOW (ref 80.0–100.0)
Platelets: 269 10*3/uL (ref 150–400)
RBC: 3.71 MIL/uL — ABNORMAL LOW (ref 3.87–5.11)
RDW: 15.9 % — ABNORMAL HIGH (ref 11.5–15.5)
WBC: 12.7 10*3/uL — ABNORMAL HIGH (ref 4.0–10.5)
nRBC: 0 % (ref 0.0–0.2)

## 2020-04-29 LAB — SURGICAL PATHOLOGY

## 2020-04-29 NOTE — Lactation Note (Signed)
This note was copied from a baby's chart. Lactation Consultation Note  Patient Name: Lindsay Perez [id number]'[id number] Date: 04/29/2020 Reason for consult: Follow-up assessment;Term;Infant < 6lbs (-4% weight loss, mom feeding choice is breast and formula feeding, infant is sleepy today, will latch but not suckle at the breast.) Age:29 hours P2, term female infant less than 6 lbs at birth with 4% weight loss. Per mom, infant briefly breast feed at 1600 pm for 5 minutes or less. Mom's current feeding choice is breast and bottle feeding, LEAD has been done with mom. 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 Per mom, she supplemented her 1st child with formula and took few days for her milk supply to get established. Per mom, infant was latching well yesterday for most feedings which was 10 to 15 minutes in length, infant has not latched well today been sleepy and most feeding was 5 minutes in length or less. Infant will latch but holds breast in mouth, mom attempted to latch infant on her left breast using the football hold position  with [facility] assistance  but infant  did not latch at this time.  LC encourage mom to continue latch infant according to cues, do not let infant go past 4 hours without feeding, continue to do as much STS as possible. Mom hand expressed 4 mls of colostrum that was spoon feed to infant, mom re-attempt to latch infant but infant would not latch, infant was given 8 mls of Similac Neosure 22 kcal on LC's gloved finger with curve tip syringe. Mom will continue to use the DEBP to help establish her milk supply and continue to latch infant at every feeding and supplement infant with her EBM first before offering formula. Mom has breastfeeding supplemental guidelines. Mom know to call RN or LC if she needs further assistance with latching  infant at the breast.  Maternal Data    Feeding Mother's Current Feeding Choice: Breast Milk and Formula (LEAD done, mom declined donor breast milk.)  LATCH Score Latch: Too sleepy or reluctant, no latch achieved, no sucking elicited.  Audible Swallowing: None  Type of Nipple: Everted at rest and after stimulation  Comfort (Breast/Nipple): Soft / non-tender  Hold (Positioning): Assistance needed to correctly position infant at breast and maintain latch.  LATCH Score: 5   Lactation Tools Discussed/Used    Interventions Interventions: Assisted with latch;Skin to skin;Hand express;Breast compression;Adjust position;Position options;Support pillows;Expressed milk;DEBP;Education  Discharge Pump: DEBP  Consult Status Consult Status: Follow-up Date: 04/30/20 Follow-up type: In-patient    Danelle Earthly 04/29/2020, 7:56 PM

## 2020-04-29 NOTE — Progress Notes (Signed)
Subjective: Postpartum Day 1: Cesarean Delivery Patient reports some incisional pain, managed with PO meds. Tolerating PO, voiding spontaneously and ambulating.     Objective: Patient Vitals for the past 24 hrs:  BP Temp Temp src Pulse Resp SpO2  04/29/20 0757 118/61 98.1 F (36.7 C) Oral 62 18 96 %  04/29/20 0530 129/74 98 F (36.7 C) Oral 74 18 98 %  04/29/20 0134 (!) 113/54 98.4 F (36.9 C) Oral 68 17 99 %  04/28/20 2338 -- -- -- -- 18 98 %  04/28/20 2122 121/61 98.1 F (36.7 C) Oral 66 18 98 %  04/28/20 1925 -- -- -- -- 17 98 %  04/28/20 1818 139/81 -- -- -- -- --  04/28/20 1745 -- 98.2 F (36.8 C) Oral -- 18 99 %  04/28/20 1630 126/71 98.1 F (36.7 C) Oral 70 18 97 %  04/28/20 1500 136/69 97.8 F (36.6 C) Oral 65 16 99 %  04/28/20 1357 115/75 97.7 F (36.5 C) Bladder 75 18 99 %  04/28/20 1345 127/72 98 F (36.7 C) -- 75 20 99 %  04/28/20 1330 125/65 -- -- 76 15 100 %  04/28/20 1315 129/69 -- -- 81 17 100 %  04/28/20 1300 129/72 -- -- 83 (!) 27 99 %  04/28/20 1253 127/70 97.9 F (36.6 C) -- 85 20 100 %   Physical Exam:  General: alert and no distress Lochia: appropriate Uterine Fundus: firm Incision: dressing no longer intact, removed, incision with steri-strips, dry, new honeycomb dressing placed DVT Evaluation: No evidence of DVT seen on physical exam.  Recent Labs    04/27/20 0937 04/29/20 0434  HGB 10.5* 9.3*  HCT 32.3* 29.0*    Assessment/Plan: Status post Cesarean section. Doing well postoperatively.  Continue current care. Christia Domke [id number] POD#1 sp CS + BTL [redacted]w[redacted]d 1. PPC: EBL 275cc, Hgb 10.5>9.3 2. ABLA: plan for PO iron at discharge 3. Obesity BMI 46. Does not have additional risk factors of DVT, encouraged ambulation 4. Desires neonatal circumcision, R/B/A of procedure discussed at length. Pt understands that neonatal circumcision is not considered medically necessary and is elective. The risks include, but are not limited to bleeding,  infection, damage to the penis, development of scar tissue, and having to have it redone at a later date. Pt understands theses risks and wishes to proceed 5. Vaccines: COVID vaccine 03/2019, completed booster, tdap and flu in pregnancy Rubella immune, blood type O+, breastfeeding, baby boy in room  Ryley Teater K Taam-Akelman 04/29/2020, 10:56 AM

## 2020-04-30 MED ORDER — IBUPROFEN 600 MG PO TABS: 600.0000 mg | ORAL_TABLET | Freq: Four times a day (QID) | ORAL | 0 refills | Status: DC | PRN

## 2020-04-30 MED ORDER — OXYCODONE-ACETAMINOPHEN 5-325 MG PO TABS: 1.0000 | ORAL_TABLET | Freq: Four times a day (QID) | ORAL | 0 refills | Status: DC | PRN

## 2020-04-30 NOTE — Discharge Summary (Signed)
Postpartum Discharge Summary      Patient Name: Lindsay Perez DOB: 1990-11-25 MRN: [id number]  Date of admission: 04/28/2020 Delivery date:04/28/2020  Delivering provider: Marlow Baars  Date of discharge: 04/30/2020  Admitting diagnosis: History of cesarean delivery [Z98.891] Intrauterine pregnancy: [redacted]w[redacted]d     Secondary diagnosis:  Active Problems:   History of cesarean delivery  Additional problems: BMI 30+    Discharge diagnosis: Term Pregnancy Delivered                                              Post partum procedures:NA Augmentation: N/A Complications: None  Hospital course: Sceduled C/S   30 y.o. yo [id number] at [redacted]w[redacted]d was admitted to the hospital 04/28/2020 for scheduled cesarean section with the following indication:Elective Repeat.Delivery details are as follows:  Membrane Rupture Time/Date: 12:00 PM ,04/28/2020   Delivery Method:C-Section, Low Transverse  Details of operation can be found in separate operative note.  Patient had an uncomplicated postpartum course.  She is ambulating, tolerating a regular diet, passing flatus, and urinating well. Patient is discharged home in stable condition on  04/30/20        Newborn Data: Birth date:04/28/2020  Birth time:12:01 PM  Gender:Female  Living status:Living  Apgars:8 ,9  Weight:2645 g      Physical exam  Vitals:   04/29/20 1125 04/29/20 1307 04/29/20 2257 04/30/20 0606  BP: (!) 117/49 (!) 116/55 (!) 122/57 126/75  Pulse: 68 66 71 73  Resp: 20 18 18 17   Temp: 98.1 F (36.7 C) 98.2 F (36.8 C) 98.1 F (36.7 C) 98.5 F (36.9 C)  TempSrc: Oral Oral Oral Oral  SpO2: 96% 100%  99%  Weight:      Height:       General: alert, cooperative and no distress Lochia: appropriate Uterine Fundus: firm Incision: Healing well with no significant drainage DVT Evaluation: No evidence of DVT seen on physical exam. Labs: Lab Results  Component Value Date   WBC 12.7 (H) 04/29/2020   HGB 9.3 (L) 04/29/2020   HCT 29.0 (L)  04/29/2020   MCV 78.2 (L) 04/29/2020   PLT 269 04/29/2020   CMP Latest Ref Rng & Units 01/29/2020  Glucose 70 - 99 mg/dL 76  BUN 6 - 20 mg/dL 6  Creatinine 01/31/2020 - 2.75 mg/dL 1.70  Sodium 0.17 - 494 mmol/L 133(L)  Potassium 3.5 - 5.1 mmol/L 3.6  Chloride 98 - 111 mmol/L 102  CO2 22 - 32 mmol/L 20(L)  Calcium 8.9 - 10.3 mg/dL 8.9  Total Protein 6.5 - 8.1 g/dL 6.8  Total Bilirubin 0.3 - 1.2 mg/dL 0.5  Alkaline Phos 38 - 126 U/L 74  AST 15 - 41 U/L 13(L)  ALT 0 - 44 U/L 11   Edinburgh Score: Edinburgh Postnatal Depression Scale Screening Tool 04/29/2020  I have been able to laugh and see the funny side of things. 0  I have looked forward with enjoyment to things. 0  I have blamed myself unnecessarily when things went wrong. 0  I have been anxious or worried for no good reason. 0  I have felt scared or panicky for no good reason. 0  Things have been getting on top of me. 0  I have been so unhappy that I have had difficulty sleeping. 0  I have felt sad or miserable. 0  I have been so unhappy that  I have been crying. 0  The thought of harming myself has occurred to me. 0  Edinburgh Postnatal Depression Scale Total 0      After visit meds:  Allergies as of 04/30/2020      Reactions   Shellfish Allergy Anaphylaxis      Medication List    STOP taking these medications   pantoprazole 20 MG tablet Commonly known as: Protonix     TAKE these medications   albuterol 108 (90 Base) MCG/ACT inhaler Commonly known as: VENTOLIN HFA Inhale 2 puffs into the lungs every 6 (six) hours as needed for wheezing or shortness of breath.   ferrous sulfate 325 (65 FE) MG tablet Take 325 mg by mouth daily.   ibuprofen 600 MG tablet Commonly known as: ADVIL Take 1 tablet (600 mg total) by mouth every 6 (six) hours as needed.   oxyCODONE-acetaminophen 5-325 MG tablet Commonly known as: PERCOCET/ROXICET Take 1-2 tablets by mouth every 6 (six) hours as needed for severe pain.   polyethylene  glycol powder 17 GM/SCOOP powder Commonly known as: GLYCOLAX/MIRALAX Take 17 g by mouth daily.   PRENATAL VITAMINS PO Take 1 tablet by mouth daily.            Discharge Care Instructions  (From admission, onward)         Start     Ordered   04/30/20 0000  Discharge wound care:       Comments: You may wash incision with soap and water.  Do not soak or submerge the incision for 2 weeks. Keep incision dry. You may need to keep a sanitary pad or panty liner between the incision and your clothing for comfort and to keep the incision dry. If you note drainage, increased pain, or increased redness of the incision, then please notify your physician.   04/30/20 1122           Discharge home in stable condition Infant Feeding: Bottle and Breast Infant Disposition:home with mother Discharge instruction: per After Visit Summary and Postpartum booklet. Activity: Advance as tolerated. Pelvic rest for 6 weeks.  Diet: routine diet Anticipated Birth Control: Unsure Postpartum Appointment:4 weeks Future Appointments:No future appointments. Follow up Visit:  Follow-up Information    Marlow Baars, MD Follow up in 4 week(s).   Specialty: Obstetrics Why: For a postpartum evaluation Contact information: [address] [place] [address] [place] [address] [phone]                   04/30/2020 Waynard Reeds, MD

## 2020-04-30 NOTE — Lactation Note (Signed)
This note was copied from a baby's chart. Lactation Consultation Note  Patient Name: Lindsay Perez [id number] Date: 04/30/2020 Reason for consult: Follow-up assessment;Term;Infant < 6lbs Age:29 hours   P2 mother whose infant is now 52 hours old.  This is a term baby at 39+3 weeks weighing < 6 lbs.  Mother breast fed her first child (now 67 years old) for 14 months.  Mother is breast feeding and supplementing with Similac 22 calorie formula. Her first born child weighed < 5 lbs.  Mother stated that her baby has started to awaken more and last fed for 45 minutes approximately 2 1/2 hours ago.  He was asleep on mother's chest when I arrived.  She stated that "sometimes he feeds and sometimes he doesn't."  Due to his current weight, I suggested supplementing between 15-30 mls after breast feeding until he is breast feeding well.  Mother stated she was going to bottle feed him at the last feeding but then he decided to breast feed well and fell asleep.  Mother desires a discharge today.  She has a DEBP for home use.  She also has our OP phone number for questions/concerns after discharge.  No support person present at this time.  Offered to return to assist/observe latching with the next feed.  Informed mother how to call for me.   Maternal Data    Feeding Mother's Current Feeding Choice: Breast Milk and Formula  LATCH Score                    Lactation Tools Discussed/Used    Interventions Interventions: Education  Discharge Discharge Education: Engorgement and breast care Pump: Personal  Consult Status Consult Status: Complete Date: 04/30/20 Follow-up type: Call as needed    Birt Reinoso R Nijee Heatwole 04/30/2020, 9:13 AM

## 2020-05-01 ENCOUNTER — Telehealth: Payer: Self-pay | Admitting: *Deleted

## 2020-05-01 NOTE — Telephone Encounter (Signed)
Transition Care Management Unsuccessful Follow-up Telephone Call  Date of discharge and from where:  04/30/2020 - [facility]  Attempts:  1st Attempt  Reason for unsuccessful TCM follow-up call:  Left voice message

## 2020-05-04 NOTE — Telephone Encounter (Signed)
Transition Care Management Unsuccessful Follow-up Telephone Call  Date of discharge and from where:  04/30/2020 - [facility]  Attempts:  2nd Attempt  Reason for unsuccessful TCM follow-up call:  Left voice message

## 2020-05-05 NOTE — Telephone Encounter (Signed)
Transition Care Management Follow-up Telephone Call  Date of discharge and from where: 04/30/2020 - [facility]  How have you been since you were released from the hospital? "I am fine"  Any questions or concerns? No  Items Reviewed:  Did the pt receive and understand the discharge instructions provided? Yes   Medications obtained and verified? Yes   Other? No   Any new allergies since your discharge? No   Dietary orders reviewed? No  Do you have support at home? Yes   Home Care and Equipment/Supplies: Were home health services ordered? not applicable If so, what is the name of the agency? N/A  Has the agency set up a time to come to the patient's home? not applicable Were any new equipment or medical supplies ordered?  No What is the name of the medical supply agency? N/A Were you able to get the supplies/equipment? not applicable Do you have any questions related to the use of the equipment or supplies? No  Functional Questionnaire: (I = Independent and D = Dependent) ADLs: I  Bathing/Dressing- I  Meal Prep- I  Eating- I  Maintaining continence- I  Transferring/Ambulation- I  Managing Meds- I  Follow up appointments reviewed:   PCP Hospital f/u appt confirmed? No    Specialist Hospital f/u appt confirmed? No  - pt knows that she needs to call her OB to make a postpartum appointment  Are transportation arrangements needed? No   If their condition worsens, is the pt aware to call PCP or go to the Emergency Dept.? Yes  Was the patient provided with contact information for the PCP's office or ED? Yes  Was to pt encouraged to call back with questions or concerns? Yes

## 2020-05-08 ENCOUNTER — Other Ambulatory Visit: Payer: Self-pay

## 2020-05-08 ENCOUNTER — Encounter (HOSPITAL_COMMUNITY): Payer: Self-pay | Admitting: Obstetrics and Gynecology

## 2020-05-08 ENCOUNTER — Inpatient Hospital Stay (HOSPITAL_COMMUNITY)
Admission: AD | Admit: 2020-05-08 | Discharge: 2020-05-08 | Disposition: A | Discharge status: Home / Self Care | Payer: Medicaid Other | Attending: Obstetrics and Gynecology | Admitting: Obstetrics and Gynecology

## 2020-05-08 DIAGNOSIS — O9903 Anemia complicating the puerperium: Secondary | ICD-10-CM

## 2020-05-08 DIAGNOSIS — O9081 Anemia of the puerperium: Secondary | ICD-10-CM | POA: Diagnosis present

## 2020-05-08 DIAGNOSIS — D649 Anemia, unspecified: Secondary | ICD-10-CM

## 2020-05-08 DIAGNOSIS — O165 Unspecified maternal hypertension, complicating the puerperium: Secondary | ICD-10-CM | POA: Diagnosis not present

## 2020-05-08 LAB — COMPREHENSIVE METABOLIC PANEL
ALT: 19 U/L (ref 0–44)
AST: 18 U/L (ref 15–41)
Albumin: 3.1 g/dL — ABNORMAL LOW (ref 3.5–5.0)
Alkaline Phosphatase: 86 U/L (ref 38–126)
Anion gap: 7 (ref 5–15)
BUN: 10 mg/dL (ref 6–20)
CO2: 24 mmol/L (ref 22–32)
Calcium: 8.9 mg/dL (ref 8.9–10.3)
Chloride: 106 mmol/L (ref 98–111)
Creatinine, Ser: 0.72 mg/dL (ref 0.44–1.00)
GFR, Estimated: >60 mL/min (ref >60)
Glucose, Bld: 86 mg/dL (ref 70–99)
Potassium: 3.9 mmol/L (ref 3.5–5.1)
Sodium: 137 mmol/L (ref 135–145)
Total Bilirubin: 0.3 mg/dL (ref 0.3–1.2)
Total Protein: 6.6 g/dL (ref 6.5–8.1)

## 2020-05-08 LAB — URINALYSIS, ROUTINE W REFLEX MICROSCOPIC
Bacteria, UA: NONE SEEN
Bilirubin Urine: NEGATIVE
Glucose, UA: NEGATIVE mg/dL
Ketones, ur: NEGATIVE mg/dL
Nitrite: NEGATIVE
Protein, ur: NEGATIVE mg/dL
Specific Gravity, Urine: 1.012 (ref 1.005–1.030)
pH: 6 (ref 5.0–8.0)

## 2020-05-08 LAB — CBC
HCT: 30.2 % — ABNORMAL LOW (ref 36.0–46.0)
Hemoglobin: 9.5 g/dL — ABNORMAL LOW (ref 12.0–15.0)
MCH: 24.2 pg — ABNORMAL LOW (ref 26.0–34.0)
MCHC: 31.5 g/dL (ref 30.0–36.0)
MCV: 76.8 fL — ABNORMAL LOW (ref 80.0–100.0)
Platelets: 385 10*3/uL (ref 150–400)
RBC: 3.93 MIL/uL (ref 3.87–5.11)
RDW: 15 % (ref 11.5–15.5)
WBC: 8.4 10*3/uL (ref 4.0–10.5)
nRBC: 0 % (ref 0.0–0.2)

## 2020-05-08 MED ORDER — NIFEDIPINE ER OSMOTIC RELEASE 30 MG PO TB24: 30.0000 mg | ORAL_TABLET | Freq: Every day | ORAL | 0 refills | Status: DC

## 2020-05-08 MED ORDER — OXYCODONE HCL 5 MG PO TABS
5.0000 mg | ORAL_TABLET | Freq: Once | ORAL | Status: AC
Start: 2020-05-08 — End: 2020-05-08
  Administered 2020-05-08: 5 mg via ORAL
  Filled 2020-05-08: qty 1

## 2020-05-08 MED ORDER — POLYSACCHARIDE IRON COMPLEX 150 MG PO CAPS: 150.0000 mg | ORAL_CAPSULE | Freq: Every day | ORAL | 0 refills | Status: DC

## 2020-05-08 NOTE — Discharge Instructions (Signed)
Hypertension During Pregnancy Hypertension is also called high blood pressure. High blood pressure means that the force of the blood moving in your body is high enough to cause problems for you and your baby. Different types of high blood pressure can happen during pregnancy. The types are:  High blood pressure before you got pregnant. This is called chronic hypertension.  This can continue during your pregnancy. Your doctor will want to keep checking your blood pressure. You may need medicine to control your blood pressure while you are pregnant. You will need follow-up visits after you have your baby.  High blood pressure that goes up during pregnancy when it was normal before. This is called gestational hypertension. It will often get better after you have your baby, but your doctor will need to watch your blood pressure to make sure that it is getting better.  You may develop high blood pressure after giving birth. This is called postpartum hypertension. This often occurs within 48 hours after childbirth but may occur up to 6 weeks after giving birth. Very high blood pressure during pregnancy is an emergency that needs treatment right away. How does this affect me? If you have high blood pressure during pregnancy, you have a higher chance of developing high blood pressure:  As you get older.  If you get pregnant again. In some cases, high blood pressure during pregnancy can cause:  Stroke.  Heart attack.  Damage to the kidneys, lungs, or liver.  Preeclampsia.  HELLP syndrome.  Seizures.  Problems with the placenta. How does this affect my baby? Your baby may:  Be born early.  Not weigh as much as he or she should.  Not handle labor well, leading to a C-section. This condition may also result in a baby's death before birth (stillbirth). What are the risks?  Having high blood pressure during a past pregnancy.  Being overweight.  Being age 35 or older.  Being pregnant  for the first time.  Being pregnant with more than one baby.  Becoming pregnant using fertility methods, such as IVF.  Having other problems, such as diabetes or kidney disease. What can I do to lower my risk?  Keep a healthy weight.  Eat a healthy diet.  Follow what your doctor tells you about treating any medical problems that you had before you got pregnant. It is very important to go to all of your doctor visits. Your doctor will check your blood pressure and make sure that your pregnancy is progressing as it should. Treatment should start early if a problem is found.   How is this treated? Treatment for high blood pressure during pregnancy can vary. It depends on the type of high blood pressure you have and how serious it is.  If you were taking medicine for your blood pressure before you got pregnant, talk with your doctor. You may need to change the medicine during pregnancy if it is not safe for your baby.  If your blood pressure goes up during pregnancy, your doctor may order medicine to treat this.  If you are at risk for preeclampsia, your doctor may tell you to take a low-dose aspirin while you are pregnant.  If you have very high blood pressure, you may need to stay in the hospital so you and your baby can be watched closely. You may also need to take medicine to lower your blood pressure.  In some cases, if your condition gets worse, you may need to have your baby early.   Follow these instructions at home: Eating and drinking  Drink enough fluid to keep your pee (urine) pale yellow.  Avoid caffeine.   Lifestyle  Do not smoke or use any products that contain nicotine or tobacco. If you need help quitting, ask your doctor.  Do not use alcohol or drugs.  Avoid stress.  Rest and get plenty of sleep.  Regular exercise can help. Ask your doctor what kinds of exercise are best for you. General instructions  Take over-the-counter and prescription medicines only as  told by your doctor.  Keep all prenatal and follow-up visits. Contact a doctor if:  You have symptoms that your doctor told you to watch for, such as: ? Headaches. ? A feeling like you may vomit (nausea). ? Vomiting. ? Belly (abdominal) pain. ? Feeling dizzy or light-headed. Get help right away if:  You have symptoms of serious problems, such as: ? Very bad belly pain that does not get better with treatment. ? A very bad headache that does not get better. ? Blurry vision. ? Double vision. ? Vomiting that does not get better. ? Sudden, fast weight gain. ? Sudden swelling in your hands, ankles, or face. ? Bleeding from your vagina. ? Blood in your pee. ? Shortness of breath. ? Chest pain. ? Weakness on one side of your body. ? Trouble talking.  Your baby is not moving as much as usual. These symptoms may be an emergency. Get help right away. Call your local emergency services (911 in the [place]  Do not wait to see if the symptoms will go away.  Do not drive yourself to the hospital. Summary  High blood pressure is also called hypertension.  High blood pressure means that the force of the blood moving in your body is high enough to cause problems for you and your baby.  Get help right away if you have symptoms of serious problems due to high blood pressure.  Keep all prenatal and follow-up visits. This information is not intended to replace advice given to you by your health care provider. Make sure you discuss any questions you have with your health care provider. Document Revised: 10/24/2019 Document Reviewed: 10/24/2019 Elsevier Patient Education  [place].   [place] medicine (25th ed., pp. [phone]). [place], [place]: Elsevier.">  Anemia  Anemia is a condition in which there is not enough red blood cells or hemoglobin in the blood. Hemoglobin is a substance in red blood cells that carries oxygen. When you do not have enough red blood cells or  hemoglobin (are anemic), your body cannot get enough oxygen and your organs may not work properly. As a result, you may feel very tired or have other problems. What are the causes? Common causes of anemia include:  Excessive bleeding. Anemia can be caused by excessive bleeding inside or outside the body, including bleeding from the intestines or from heavy menstrual periods in females.  Poor nutrition.  Long-lasting (chronic) kidney, thyroid, and liver disease.  Bone marrow disorders, spleen problems, and blood disorders.  Cancer and treatments for cancer.  HIV (human immunodeficiency virus) and AIDS (acquired immunodeficiency syndrome).  Infections, medicines, and autoimmune disorders that destroy red blood cells. What are the signs or symptoms? Symptoms of this condition include:  Minor weakness.  Dizziness.  Headache, or difficulties concentrating and sleeping.  Heartbeats that feel irregular or faster than normal (palpitations).  Shortness of breath, especially with exercise.  Pale skin, lips, and nails, or cold hands and feet.  Indigestion and  nausea. Symptoms may occur suddenly or develop slowly. If your anemia is mild, you may not have symptoms. How is this diagnosed? This condition is diagnosed based on blood tests, your medical history, and a physical exam. In some cases, a test may be needed in which cells are removed from the soft tissue inside of a bone and looked at under a microscope (bone marrow biopsy). Your health care provider may also check your stool (feces) for blood and may do additional testing to look for the cause of your bleeding. Other tests may include:  Imaging tests, such as a CT scan or MRI.  A procedure to see inside your esophagus and stomach (endoscopy).  A procedure to see inside your colon and rectum (colonoscopy). How is this treated? Treatment for this condition depends on the cause. If you continue to lose a lot of blood, you may need  to be treated at a hospital. Treatment may include:  Taking supplements of iron, vitamin M35, or folic acid.  Taking a hormone medicine (erythropoietin) that can help to stimulate red blood cell growth.  Having a blood transfusion. This may be needed if you lose a lot of blood.  Making changes to your diet.  Having surgery to remove your spleen. Follow these instructions at home:  Take over-the-counter and prescription medicines only as told by your health care provider.  Take supplements only as told by your health care provider.  Follow any diet instructions that you were given by your health care provider.  Keep all follow-up visits as told by your health care provider. This is important. Contact a health care provider if:  You develop new bleeding anywhere in the body. Get help right away if:  You are very weak.  You are short of breath.  You have pain in your abdomen or chest.  You are dizzy or feel faint.  You have trouble concentrating.  You have bloody stools, black stools, or tarry stools.  You vomit repeatedly or you vomit up blood. These symptoms may represent a serious problem that is an emergency. Do not wait to see if the symptoms will go away. Get medical help right away. Call your local emergency services (911 in the [place] Do not drive yourself to the hospital. Summary  Anemia is a condition in which you do not have enough red blood cells or enough of a substance in your red blood cells that carries oxygen (hemoglobin).  Symptoms may occur suddenly or develop slowly.  If your anemia is mild, you may not have symptoms.  This condition is diagnosed with blood tests, a medical history, and a physical exam. Other tests may be needed.  Treatment for this condition depends on the cause of the anemia. This information is not intended to replace advice given to you by your health care provider. Make sure you discuss any questions you have with your health  care provider. Document Revised: 01/08/2019 Document Reviewed: 01/08/2019 Elsevier Patient Education  2021 [employer].

## 2020-05-08 NOTE — MAU Provider Note (Addendum)
History     CSN: [id number]  Arrival date and time: 05/08/20 1327   Event Date/Time   First Provider Initiated Contact with Patient 05/08/20 1442      Chief Complaint  Patient presents with  . Hypertension  . Headache  . Abdominal Pain   30 y.o. G4P2 s/p RCS 10 days ago presenting with elevated BP and HA. Sx started 2 days ago. Reports BP at home was 140/80-90s. Reports frontal HA that improved temporarily. Rates pain 8/10. Denies visual changes but reports light sensitivity. Denies SOB, RUQ pain, and CP. Hx of headaches outside of pregnancy.   OB History    Gravida  4   Para  2   Term  2   Preterm      AB  2   Living  2     SAB  2   IAB      Ectopic      Multiple  0   Live Births  2           Past Medical History:  Diagnosis Date  . Anemia    as a child  . Asthma    as a child  . Chlamydia   . GERD (gastroesophageal reflux disease)   . Headache(784.0)    migranes  . Seasonal allergies   . Trichomonas infection     Past Surgical History:  Procedure Laterality Date  . CESAREAN SECTION N/A 10/26/2017   Procedure: CESAREAN SECTION;  Surgeon: Marlow Baars, MD;  Location: [facility] BIRTHING SUITES;  Service: Obstetrics;  Laterality: N/A;  . CESAREAN SECTION WITH BILATERAL TUBAL LIGATION Bilateral 04/28/2020   Procedure: REPEAT CESAREAN SECTION WITH BILATERAL TUBAL LIGATION;  Surgeon: Marlow Baars, MD;  Location: [facility] LD ORS;  Service: Obstetrics;  Laterality: Bilateral;  . WISDOM TOOTH EXTRACTION      Family History  Adopted: Yes  Problem Relation Age of Onset  . Drug abuse Mother   . Mental illness Brother   . Mental retardation Brother     Social History   Tobacco Use  . Smoking status: Never Smoker  . Smokeless tobacco: Never Used  Vaping Use  . Vaping Use: Never used  Substance Use Topics  . Alcohol use: Not Currently    Alcohol/week: 19.0 standard drinks    Types: 5 Glasses of wine, 14 Shots of liquor per week  . Drug use: Not Currently     Frequency: 7.0 times per week    Types: Marijuana    Comment: last use in Jan 2019    Allergies:  Allergies  Allergen Reactions  . Shellfish Allergy Anaphylaxis    No medications prior to admission.    Review of Systems  Eyes: Negative for visual disturbance.  Respiratory: Negative for shortness of breath.   Gastrointestinal: Negative for abdominal pain.  Neurological: Positive for headaches.   Physical Exam   Blood pressure (!) 159/82, pulse (!) 57, temperature 98.3 F (36.8 C), temperature source Oral, resp. rate 16, height 5' 1.75" (1.568 m), weight 107.1 kg, SpO2 97 %, unknown if currently breastfeeding. Patient Vitals for the past 24 hrs:  BP Temp Temp src Pulse Resp SpO2 Height Weight  05/08/20 1547 (!) 159/82 - - (!) 57 - - - -  05/08/20 1532 (!) 152/85 - - 60 - - - -  05/08/20 1502 (!) 147/100 - - 63 - - - -  05/08/20 1446 (!) 157/94 - - 67 - - - -  05/08/20 1432 (!) 142/88 - - 71 - - - -  05/08/20 1416 (!) 144/85 - - 67 - - - -  05/08/20 1401 (!) 143/82 - - 69 - - - -  05/08/20 1359 128/78 - - 72 - - - -  05/08/20 1341 (!) 141/97 98.3 F (36.8 C) Oral 75 16 97 % - -  05/08/20 1337 - - - - - - 5' 1.75" (1.568 m) 107.1 kg   Physical Exam Vitals and nursing note reviewed.  Constitutional:      General: She is not in acute distress.    Appearance: Normal appearance.  HENT:     Head: Normocephalic and atraumatic.  Pulmonary:     Effort: Pulmonary effort is normal. No respiratory distress.  Abdominal:     General: There is no distension.     Palpations: Abdomen is soft. There is no mass.     Tenderness: There is no abdominal tenderness. There is no guarding.     Comments: Low transfer incision well approximated and healing  Musculoskeletal:        General: No swelling or deformity. Normal range of motion.     Cervical back: Normal range of motion.     Right lower leg: No edema.     Left lower leg: No edema.  Skin:    General: Skin is warm and dry.   Neurological:     General: No focal deficit present.     Mental Status: She is alert and oriented to person, place, and time.  Psychiatric:        Mood and Affect: Mood normal.        Behavior: Behavior normal.    Results for orders placed or performed during the hospital encounter of 05/08/20 (from the past 24 hour(s))  Urinalysis, Routine w reflex microscopic Urine, Clean Catch     Status: Abnormal   Collection Time: 05/08/20  1:44 PM  Result Value Ref Range   Color, Urine YELLOW YELLOW   APPearance CLEAR CLEAR   Specific Gravity, Urine 1.012 1.005 - 1.030   pH 6.0 5.0 - 8.0   Glucose, UA NEGATIVE NEGATIVE mg/dL   Hgb urine dipstick LARGE (A) NEGATIVE   Bilirubin Urine NEGATIVE NEGATIVE   Ketones, ur NEGATIVE NEGATIVE mg/dL   Protein, ur NEGATIVE NEGATIVE mg/dL   Nitrite NEGATIVE NEGATIVE   Leukocytes,Ua LARGE (A) NEGATIVE   RBC / HPF 0-5 0 - 5 RBC/hpf   WBC, UA 6-10 0 - 5 WBC/hpf   Bacteria, UA NONE SEEN NONE SEEN   Squamous Epithelial / LPF 0-5 0 - 5  CBC     Status: Abnormal   Collection Time: 05/08/20  2:59 PM  Result Value Ref Range   WBC 8.4 4.0 - 10.5 K/uL   RBC 3.93 3.87 - 5.11 MIL/uL   Hemoglobin 9.5 (L) 12.0 - 15.0 g/dL   HCT 68.3 (L) 41.9 - 62.2 %   MCV 76.8 (L) 80.0 - 100.0 fL   MCH 24.2 (L) 26.0 - 34.0 pg   MCHC 31.5 30.0 - 36.0 g/dL   RDW 29.7 98.9 - 21.1 %   Platelets 385 150 - 400 K/uL   nRBC 0.0 0.0 - 0.2 %  Comprehensive metabolic panel     Status: Abnormal   Collection Time: 05/08/20  2:59 PM  Result Value Ref Range   Sodium 137 135 - 145 mmol/L   Potassium 3.9 3.5 - 5.1 mmol/L   Chloride 106 98 - 111 mmol/L   CO2 24 22 - 32 mmol/L   Glucose, Bld 86 70 -  99 mg/dL   BUN 10 6 - 20 mg/dL   Creatinine, Ser 6.80 0.44 - 1.00 mg/dL   Calcium 8.9 8.9 - 32.1 mg/dL   Total Protein 6.6 6.5 - 8.1 g/dL   Albumin 3.1 (L) 3.5 - 5.0 g/dL   AST 18 15 - 41 U/L   ALT 19 0 - 44 U/L   Alkaline Phosphatase 86 38 - 126 U/L   Total Bilirubin 0.3 0.3 - 1.2 mg/dL    GFR, Estimated >22 >48 mL/min   Anion gap 7 5 - 15    MAU Course  Procedures Oxycodone  MDM Labs ordered and reviewed. No signs of PEC. HA improved. Consult with Dr. Jolayne Panther, plan to start Procardia and outpt f/u. Will treat anemia. Stable for discharge home.   Assessment and Plan   1. Postpartum hypertension   2. Anemia, postpartum    Discharge home Follow up at [facility] on 05/11/20 for BP check-message left  Rx Procardia Rx Ferrex Strict return precautions  Allergies as of 05/08/2020      Reactions   Shellfish Allergy Anaphylaxis      Medication List    STOP taking these medications   ferrous sulfate 325 (65 FE) MG tablet     TAKE these medications   albuterol 108 (90 Base) MCG/ACT inhaler Commonly known as: VENTOLIN HFA Inhale 2 puffs into the lungs every 6 (six) hours as needed for wheezing or shortness of breath.   ibuprofen 600 MG tablet Commonly known as: ADVIL Take 1 tablet (600 mg total) by mouth every 6 (six) hours as needed.   iron polysaccharides 150 MG capsule Commonly known as: NIFEREX Take 1 capsule (150 mg total) by mouth daily.   NIFEdipine 30 MG 24 hr tablet Commonly known as: Procardia XL Take 1 tablet (30 mg total) by mouth daily.   oxyCODONE-acetaminophen 5-325 MG tablet Commonly known as: PERCOCET/ROXICET Take 1-2 tablets by mouth every 6 (six) hours as needed for severe pain.   polyethylene glycol powder 17 GM/SCOOP powder Commonly known as: GLYCOLAX/MIRALAX Take 17 g by mouth daily.   PRENATAL VITAMINS PO Take 1 tablet by mouth daily.       Donette Larry, CNM 05/08/2020, 4:23 PM

## 2020-05-08 NOTE — MAU Note (Signed)
Lindsay Perez is a 30 y.o. here in MAU reporting: s/p RCS/BTL on 04/28/20. States for the past couple of days has been having headaches. Pt also reports increased swelling in her left ankle. BP at home last night was 144/93 and today it was 148/82. Also having right lower abdominal pain.  Onset of complaint: ongoing for a couple of days  Pain score: 8/10  Vitals:   05/08/20 1341  BP: (!) 141/97  Pulse: 75  Resp: 16  Temp: 98.3 F (36.8 C)  SpO2: 97%     Lab orders placed from triage: none

## 2020-06-10 DIAGNOSIS — O1405 Mild to moderate pre-eclampsia, complicating the puerperium: Secondary | ICD-10-CM | POA: Diagnosis not present

## 2020-07-01 ENCOUNTER — Encounter: Payer: Self-pay | Admitting: Internal Medicine

## 2020-07-01 ENCOUNTER — Ambulatory Visit: Payer: Medicaid Other | Admitting: Internal Medicine

## 2020-07-01 ENCOUNTER — Other Ambulatory Visit: Payer: Self-pay

## 2020-07-01 ENCOUNTER — Telehealth (INDEPENDENT_AMBULATORY_CARE_PROVIDER_SITE_OTHER): Payer: Medicaid Other | Admitting: Internal Medicine

## 2020-07-01 DIAGNOSIS — G43909 Migraine, unspecified, not intractable, without status migrainosus: Secondary | ICD-10-CM | POA: Diagnosis not present

## 2020-07-01 MED ORDER — SUMATRIPTAN SUCCINATE 25 MG PO TABS: 25.0000 mg | ORAL_TABLET | ORAL | 0 refills | Status: DC | PRN

## 2020-07-01 NOTE — Progress Notes (Signed)
C/o headache, intermittent, everyday.  Taking Ibuprofen 600 mg.   Bloating- pain 5/10

## 2020-07-01 NOTE — Progress Notes (Signed)
Virtual Visit via Telephone Note  I connected with Lindsay Perez, on 07/01/2020 at 3:48 PM by telephone due to the COVID-19 pandemic and verified that I am speaking with the correct person using two identifiers.   Consent: I discussed the limitations, risks, security and privacy concerns of performing an evaluation and management service by telephone and the availability of in person appointments. I also discussed with the patient that there may be a patient responsible charge related to this service. The patient expressed understanding and agreed to proceed.   Location of Patient: Home  Location of Provider: Clinic    Persons participating in Telemedicine visit: Lindsay Perez [facility] Dr. Earlene Plater      History of Present Illness: Patient has concerns about headaches. Patient reports she has a history of migraines and these do feel like such. She did have delivery in March. Was seen in MAU on 3/25 for headaches and found to have PP HTN. Was started on procardia. Reports that HTN has improved and headaches have improved but not resolved. They do resolve with Ibuprofen 600 mg---but she is needing to take daily. She is monitoring BPs at home, <140/90 on average. She is not breastfeeding.    Past Medical History:  Diagnosis Date  . Anemia    as a child  . Asthma    as a child  . Chlamydia   . GERD (gastroesophageal reflux disease)   . Headache(784.0)    migranes  . Seasonal allergies   . Trichomonas infection    Allergies  Allergen Reactions  . Shellfish Allergy Anaphylaxis    Current Outpatient Medications on File Prior to Visit  Medication Sig Dispense Refill  . albuterol (VENTOLIN HFA) 108 (90 Base) MCG/ACT inhaler Inhale 2 puffs into the lungs every 6 (six) hours as needed for wheezing or shortness of breath. 18 g 1  . ibuprofen (ADVIL) 600 MG tablet Take 1 tablet (600 mg total) by mouth every 6 (six) hours as needed. 90 tablet 0  . NIFEdipine  (PROCARDIA XL) 30 MG 24 hr tablet Take 1 tablet (30 mg total) by mouth daily. 30 tablet 0   No current facility-administered medications on file prior to visit.    Observations/Objective: NAD. Speaking clearly.  Work of breathing normal.  Alert and oriented. Mood appropriate.   Assessment and Plan: 1. Migraine without status migrainosus, not intractable, unspecified migraine type History of migraine headaches. Is on medication for BP and monitoring at home, well controlled. Discussed discontinuing Ibuprofen due to concern for rebound headaches and other potential side effects of chronic NSAID use. Will start Imitrex as abortive agent. If continues to have persistent headaches, would consider switching to prophylactic agent.  - SUMAtriptan (IMITREX) 25 MG tablet; Take 1 tablet (25 mg total) by mouth every 2 (two) hours as needed for migraine. May repeat in 2 hours if headache persists or recurs. Do not take more than 2 tablets in 24 hours.  Dispense: 10 tablet; Refill: 0   Follow Up Instructions: PRN and for routine medical care    I discussed the assessment and treatment plan with the patient. The patient was provided an opportunity to ask questions and all were answered. The patient agreed with the plan and demonstrated an understanding of the instructions.   The patient was advised to call back or seek an in-person evaluation if the symptoms worsen or if the condition fails to improve as anticipated.     I provided 12 minutes total of non-face-to-face time during this  encounter including median intraservice time, reviewing previous notes, investigations, ordering medications, medical decision making, coordinating care and patient verbalized understanding at the end of the visit.    Marcy Siren, D.O. Primary Care at [facility]  07/01/2020, 3:48 PM

## 2020-08-03 DIAGNOSIS — N920 Excessive and frequent menstruation with regular cycle: Secondary | ICD-10-CM | POA: Diagnosis not present

## 2021-01-14 ENCOUNTER — Encounter: Payer: Self-pay | Admitting: Nurse Practitioner

## 2021-01-14 ENCOUNTER — Ambulatory Visit (INDEPENDENT_AMBULATORY_CARE_PROVIDER_SITE_OTHER): Payer: Medicaid Other | Admitting: Nurse Practitioner

## 2021-01-14 ENCOUNTER — Other Ambulatory Visit: Payer: Self-pay

## 2021-01-14 VITALS — BP 131/83 | HR 87 | Resp 16

## 2021-01-14 DIAGNOSIS — R609 Edema, unspecified: Secondary | ICD-10-CM | POA: Diagnosis not present

## 2021-01-14 MED ORDER — FUROSEMIDE 20 MG PO TABS: 10.0000 mg | ORAL_TABLET | Freq: Every day | ORAL | 0 refills | Status: DC

## 2021-01-14 NOTE — Progress Notes (Signed)
@Patient  ID: Lindsay Perez, female    DOB: 09/30/1990, 30 y.o.   MRN: [id number]  Chief Complaint  Patient presents with   Edema     Referring provider: Caryl Never*  HPI  Patient presents today for an acute visit.  She has been having increased peripheral edema.  We will order labs today.  Encourage patient to stay active with walking routine.  Encourage patient to wear compression hose.  Will order Lasix over the next couple days. Denies f/c/s, n/v/d, hemoptysis, PND, leg swelling Denies chest pain or edema      Allergies  Allergen Reactions   Bee Pollen Itching   Shellfish Allergy Anaphylaxis, Swelling and Other (See Comments)    Immunization History  Administered Date(s) Administered   PFIZER(Purple Top)SARS-COV-2 Vaccination 01/14/2020   Tdap 07/25/2017   Unspecified SARS-COV-2 Vaccination 03/18/2019, 04/14/2019    Past Medical History:  Diagnosis Date   Anemia    as a child   Asthma    as a child   Chlamydia    GERD (gastroesophageal reflux disease)    Headache(784.0)    migranes   Seasonal allergies    Trichomonas infection     Tobacco History: Social History   Tobacco Use  Smoking Status Never  Smokeless Tobacco Never   Counseling given: Yes    Outpatient Encounter Medications as of 01/14/2021  Medication Sig   [DISCONTINUED] furosemide (LASIX) 20 MG tablet Take 0.5 tablets (10 mg total) by mouth daily for 2 days.   albuterol (VENTOLIN HFA) 108 (90 Base) MCG/ACT inhaler Inhale 2 puffs into the lungs every 6 (six) hours as needed for wheezing or shortness of breath.   [DISCONTINUED] ibuprofen (ADVIL) 600 MG tablet Take 1 tablet (600 mg total) by mouth every 6 (six) hours as needed.   [DISCONTINUED] NIFEdipine (PROCARDIA XL) 30 MG 24 hr tablet Take 1 tablet (30 mg total) by mouth daily.   [DISCONTINUED] SUMAtriptan (IMITREX) 25 MG tablet Take 1 tablet (25 mg total) by mouth every 2 (two) hours as needed for migraine. May repeat in 2  hours if headache persists or recurs. Do not take more than 2 tablets in 24 hours.   No facility-administered encounter medications on file as of 01/14/2021.     Review of Systems  Review of Systems  Constitutional: Negative.   HENT: Negative.    Cardiovascular:  Positive for leg swelling.  Gastrointestinal: Negative.   Allergic/Immunologic: Negative.   Neurological: Negative.   Psychiatric/Behavioral: Negative.         Physical Exam  BP 131/83   Pulse 87   Resp 16   Wt Readings from Last 5 Encounters:  03/25/22 235 lb 9.6 oz (106.9 kg)  03/14/22 232 lb (105.2 kg)  01/12/22 235 lb (106.6 kg)  12/28/21 234 lb (106.1 kg)  12/02/21 234 lb (106.1 kg)     Physical Exam Vitals and nursing note reviewed.  Constitutional:      General: She is not in acute distress.    Appearance: She is well-developed.  Cardiovascular:     Rate and Rhythm: Normal rate and regular rhythm.  Pulmonary:     Effort: Pulmonary effort is normal.     Breath sounds: Normal breath sounds.  Neurological:     Mental Status: She is alert and oriented to person, place, and time.      Lab Results:  CBC    Component Value Date/Time   WBC 8.2 12/28/2021 1527   RBC 4.68 12/28/2021 1527   HGB  10.9 (L) 12/28/2021 1527   HGB 10.9 (L) 01/14/2021 1604   HCT 34.8 (L) 12/28/2021 1527   HCT 34.0 01/14/2021 1604   HCT 33 08/23/2019 0000   PLT 333.0 12/28/2021 1527   PLT 319 01/14/2021 1604   MCV 74.5 (L) 12/28/2021 1527   MCV 71 (L) 01/14/2021 1604   MCV 74 (A) 08/23/2019 0000   MCH 22.9 (L) 01/14/2021 1604   MCH 24.2 (L) 05/08/2020 1459   MCHC 31.3 12/28/2021 1527   RDW 16.2 (H) 12/28/2021 1527   RDW 15.7 (H) 01/14/2021 1604   RDW 16.1 08/23/2019 0000   LYMPHSABS 3.0 03/04/2014 0135   MONOABS 0 08/23/2019 0000   EOSABS 0 08/23/2019 0000   BASOSABS 0 08/23/2019 0000    BMET    Component Value Date/Time   NA 143 12/28/2021 1527   NA 137 01/14/2021 1604   K 3.7 12/28/2021 1527   CL 110  12/28/2021 1527   CO2 22 12/28/2021 1527   GLUCOSE 83 12/28/2021 1527   BUN 7 12/28/2021 1527   BUN 10 01/14/2021 1604   CREATININE 0.68 12/28/2021 1527   CALCIUM 9.5 12/28/2021 1527   GFRNONAA >60 05/08/2020 1459   GFRAA >60 04/06/2015 1038    BNP No results found for: "BNP"  ProBNP No results found for: "PROBNP"  Imaging: No results found.   Assessment & Plan:   Peripheral edema Will order labs  Stay active - walking routine  Stay well hydrated  Wear compression hose  Will order low dose lasix for 2 days     Follow up:  Follow up for weight loss     Fenton Foy, NP 05/09/2022

## 2021-01-14 NOTE — Patient Instructions (Addendum)
Peripheral Edema:  Will order labs  Stay active - walking routine  Stay well hydrated  Wear compression hose  Will order low dose lasix for 2 days     Follow up:  Follow up for weight loss   Exercising to Stay Healthy To become healthy and stay healthy, it is recommended that you do moderate-intensity and vigorous-intensity exercise. You can tell that you are exercising at a moderate intensity if your heart starts beating faster and you start breathing faster but can still hold a conversation. You can tell that you are exercising at a vigorous intensity if you are breathing much harder and faster and cannot hold a conversation while exercising. How can exercise benefit me? Exercising regularly is important. It has many health benefits, such as: Improving overall fitness, flexibility, and endurance. Increasing bone density. Helping with weight control. Decreasing body fat. Increasing muscle strength and endurance. Reducing stress and tension, anxiety, depression, or anger. Improving overall health. What guidelines should I follow while exercising? Before you start a new exercise program, talk with your health care provider. Do not exercise so much that you hurt yourself, feel dizzy, or get very short of breath. Wear comfortable clothes and wear shoes with good support. Drink plenty of water while you exercise to prevent dehydration or heat stroke. Work out until your breathing and your heartbeat get faster (moderate intensity). How often should I exercise? Choose an activity that you enjoy, and set realistic goals. Your health care provider can help you make an activity plan that is individually designed and works best for you. Exercise regularly as told by your health care provider. This may include: Doing strength training two times a week, such as: Lifting weights. Using resistance bands. Push-ups. Sit-ups. Yoga. Doing a certain intensity of exercise for a given amount  of time. Choose from these options: A total of 150 minutes of moderate-intensity exercise every week. A total of 75 minutes of vigorous-intensity exercise every week. A mix of moderate-intensity and vigorous-intensity exercise every week. Children, pregnant women, people who have not exercised regularly, people who are overweight, and older adults may need to talk with a health care provider about what activities are safe to perform. If you have a medical condition, be sure to talk with your health care provider before you start a new exercise program. What are some exercise ideas? Moderate-intensity exercise ideas include: Walking 1 mile (1.6 km) in about 15 minutes. Biking. Hiking. Golfing. Dancing. Water aerobics. Vigorous-intensity exercise ideas include: Walking 4.5 miles (7.2 km) or more in about 1 hour. Jogging or running 5 miles (8 km) in about 1 hour. Biking 10 miles (16.1 km) or more in about 1 hour. Lap swimming. Roller-skating or in-line skating. Cross-country skiing. Vigorous competitive sports, such as football, basketball, and soccer. Jumping rope. Aerobic dancing. What are some everyday activities that can help me get exercise? Yard work, such as: Child psychotherapist. Raking and bagging leaves. Washing your car. Pushing a stroller. Shoveling snow. Gardening. Washing windows or floors. How can I be more active in my day-to-day activities? Use stairs instead of an elevator. Take a walk during your lunch break. If you drive, park your car farther away from your work or school. If you take public transportation, get off one stop early and walk the rest of the way. Stand up or walk around during all of your indoor phone calls. Get up, stretch, and walk around every 30 minutes throughout the day. Enjoy exercise with a friend.  Support to continue exercising will help you keep a regular routine of activity. Where to find more information You can find more information  about exercising to stay healthy from: U.S. Department of Health and Human Services: ThisPath.fi Centers for Disease Control and Prevention (CDC): FootballExhibition.com.br Summary Exercising regularly is important. It will improve your overall fitness, flexibility, and endurance. Regular exercise will also improve your overall health. It can help you control your weight, reduce stress, and improve your bone density. Do not exercise so much that you hurt yourself, feel dizzy, or get very short of breath. Before you start a new exercise program, talk with your health care provider. This information is not intended to replace advice given to you by your health care provider. Make sure you discuss any questions you have with your health care provider. Document Revised: 05/29/2020 Document Reviewed: 05/29/2020 Elsevier Patient Education  2022 Elsevier Inc.    Calorie Counting for [employer] Calories are units of energy. Your body needs a certain number of calories from food to keep going throughout the day. When you eat or drink more calories than your body needs, your body stores the extra calories mostly as fat. When you eat or drink fewer calories than your body needs, your body burns fat to get the energy it needs. Calorie counting means keeping track of how many calories you eat and drink each day. Calorie counting can be helpful if you need to lose weight. If you eat fewer calories than your body needs, you should lose weight. Ask your health care provider what a healthy weight is for you. For calorie counting to work, you will need to eat the right number of calories each day to lose a healthy amount of weight per week. A dietitian can help you figure out how many calories you need in a day and will suggest ways to reach your calorie goal. A healthy amount of weight to lose each week is usually 1-2 lb (0.5-0.9 kg). This usually means that your daily calorie intake should be reduced by 500-750  calories. Eating 1,200-1,500 calories a day can help most women lose weight. Eating 1,500-1,800 calories a day can help most men lose weight. What do I need to know about calorie counting? Work with your health care provider or dietitian to determine how many calories you should get each day. To meet your daily calorie goal, you will need to: Find out how many calories are in each food that you would like to eat. Try to do this before you eat. Decide how much of the food you plan to eat. Keep a food log. Do this by writing down what you ate and how many calories it had. To successfully lose weight, it is important to balance calorie counting with a healthy lifestyle that includes regular activity. Where do I find calorie information? The number of calories in a food can be found on a Nutrition Facts label. If a food does not have a Nutrition Facts label, try to look up the calories online or ask your dietitian for help. Remember that calories are listed per serving. If you choose to have more than one serving of a food, you will have to multiply the calories per serving by the number of servings you plan to eat. For example, the label on a package of bread might say that a serving size is 1 slice and that there are 90 calories in a serving. If you eat 1 slice, you will have eaten  90 calories. If you eat 2 slices, you will have eaten 180 calories. How do I keep a food log? After each time that you eat, record the following in your food log as soon as possible: What you ate. Be sure to include toppings, sauces, and other extras on the food. How much you ate. This can be measured in cups, ounces, or number of items. How many calories were in each food and drink. The total number of calories in the food you ate. Keep your food log near you, such as in a pocket-sized notebook or on an app or website on your mobile phone. Some programs will calculate calories for you and show you how many calories you  have left to meet your daily goal. What are some portion-control tips? Know how many calories are in a serving. This will help you know how many servings you can have of a certain food. Use a measuring cup to measure serving sizes. You could also try weighing out portions on a kitchen scale. With time, you will be able to estimate serving sizes for some foods. Take time to put servings of different foods on your favorite plates or in your favorite bowls and cups so you know what a serving looks like. Try not to eat straight from a food's packaging, such as from a bag or box. Eating straight from the package makes it hard to see how much you are eating and can lead to overeating. Put the amount you would like to eat in a cup or on a plate to make sure you are eating the right portion. Use smaller plates, glasses, and bowls for smaller portions and to prevent overeating. Try not to multitask. For example, avoid watching TV or using your computer while eating. If it is time to eat, sit down at a table and enjoy your food. This will help you recognize when you are full. It will also help you be more mindful of what and how much you are eating. What are tips for following this plan? Reading food labels Check the calorie count compared with the serving size. The serving size may be smaller than what you are used to eating. Check the source of the calories. Try to choose foods that are high in protein, fiber, and vitamins, and low in saturated fat, trans fat, and sodium. Shopping Read nutrition labels while you shop. This will help you make healthy decisions about which foods to buy. Pay attention to nutrition labels for low-fat or fat-free foods. These foods sometimes have the same number of calories or more calories than the full-fat versions. They also often have added sugar, starch, or salt to make up for flavor that was removed with the fat. Make a grocery list of lower-calorie foods and stick to  it. Cooking Try to cook your favorite foods in a healthier way. For example, try baking instead of frying. Use low-fat dairy products. Meal planning Use more fruits and vegetables. One-half of your plate should be fruits and vegetables. Include lean proteins, such as chicken, [place], and fish. Lifestyle Each week, aim to do one of the following: 150 minutes of moderate exercise, such as walking. 75 minutes of vigorous exercise, such as running. General information Know how many calories are in the foods you eat most often. This will help you calculate calorie counts faster. Find a way of tracking calories that works for you. Get creative. Try different apps or programs if writing down calories does not work  for you. What foods should I eat?  Eat nutritious foods. It is better to have a nutritious, high-calorie food, such as an avocado, than a food with few nutrients, such as a bag of potato chips. Use your calories on foods and drinks that will fill you up and will not leave you hungry soon after eating. Examples of foods that fill you up are nuts and nut butters, vegetables, lean proteins, and high-fiber foods such as whole grains. High-fiber foods are foods with more than 5 g of fiber per serving. Pay attention to calories in drinks. Low-calorie drinks include water and unsweetened drinks. The items listed above may not be a complete list of foods and beverages you can eat. Contact a dietitian for more information. What foods should I limit? Limit foods or drinks that are not good sources of vitamins, minerals, or protein or that are high in unhealthy fats. These include: Candy. Other sweets. Sodas, specialty coffee drinks, alcohol, and juice. The items listed above may not be a complete list of foods and beverages you should avoid. Contact a dietitian for more information. How do I count calories when eating out? Pay attention to portions. Often, portions are much larger when eating  out. Try these tips to keep portions smaller: Consider sharing a meal instead of getting your own. If you get your own meal, eat only half of it. Before you start eating, ask for a container and put half of your meal into it. When available, consider ordering smaller portions from the menu instead of full portions. Pay attention to your food and drink choices. Knowing the way food is cooked and what is included with the meal can help you eat fewer calories. If calories are listed on the menu, choose the lower-calorie options. Choose dishes that include vegetables, fruits, whole grains, low-fat dairy products, and lean proteins. Choose items that are boiled, broiled, grilled, or steamed. Avoid items that are buttered, battered, fried, or served with cream sauce. Items labeled as crispy are usually fried, unless stated otherwise. Choose water, low-fat milk, unsweetened iced tea, or other drinks without added sugar. If you want an alcoholic beverage, choose a lower-calorie option, such as a glass of wine or light beer. Ask for dressings, sauces, and syrups on the side. These are usually high in calories, so you should limit the amount you eat. If you want a salad, choose a garden salad and ask for grilled meats. Avoid extra toppings such as bacon, cheese, or fried items. Ask for the dressing on the side, or ask for olive oil and vinegar or lemon to use as dressing. Estimate how many servings of a food you are given. Knowing serving sizes will help you be aware of how much food you are eating at restaurants. Where to find more information Centers for Disease Control and Prevention: FootballExhibition.com.br U.S. Department of Agriculture: WrestlingReporter.dk Summary Calorie counting means keeping track of how many calories you eat and drink each day. If you eat fewer calories than your body needs, you should lose weight. A healthy amount of weight to lose per week is usually 1-2 lb (0.5-0.9 kg). This usually means reducing  your daily calorie intake by 500-750 calories. The number of calories in a food can be found on a Nutrition Facts label. If a food does not have a Nutrition Facts label, try to look up the calories online or ask your dietitian for help. Use smaller plates, glasses, and bowls for smaller portions and to prevent overeating.  Use your calories on foods and drinks that will fill you up and not leave you hungry shortly after a meal. This information is not intended to replace advice given to you by your health care provider. Make sure you discuss any questions you have with your health care provider. Document Revised: 03/14/2019 Document Reviewed: 03/14/2019 Elsevier Patient Education  2022 [employer].

## 2021-01-15 ENCOUNTER — Encounter: Payer: Self-pay | Admitting: Nurse Practitioner

## 2021-01-15 LAB — COMPREHENSIVE METABOLIC PANEL
ALT: 24 IU/L (ref 0–32)
AST: 23 IU/L (ref 0–40)
Albumin/Globulin Ratio: 1.5 (ref 1.2–2.2)
Albumin: 4.5 g/dL (ref 3.9–5.0)
Alkaline Phosphatase: 101 IU/L (ref 44–121)
BUN/Creatinine Ratio: 14 (ref 9–23)
BUN: 10 mg/dL (ref 6–20)
Bilirubin Total: 0.4 mg/dL (ref 0.0–1.2)
CO2: 24 mmol/L (ref 20–29)
Calcium: 9.5 mg/dL (ref 8.7–10.2)
Chloride: 99 mmol/L (ref 96–106)
Creatinine, Ser: 0.69 mg/dL (ref 0.57–1.00)
Globulin, Total: 3 g/dL (ref 1.5–4.5)
Glucose: 91 mg/dL (ref 70–99)
Potassium: 4.2 mmol/L (ref 3.5–5.2)
Sodium: 137 mmol/L (ref 134–144)
Total Protein: 7.5 g/dL (ref 6.0–8.5)
eGFR: 121 mL/min/{1.73_m2} (ref >59)

## 2021-01-15 LAB — CBC
Hematocrit: 34 % (ref 34.0–46.6)
Hemoglobin: 10.9 g/dL — ABNORMAL LOW (ref 11.1–15.9)
MCH: 22.9 pg — ABNORMAL LOW (ref 26.6–33.0)
MCHC: 32.1 g/dL (ref 31.5–35.7)
MCV: 71 fL — ABNORMAL LOW (ref 79–97)
Platelets: 319 10*3/uL (ref 150–450)
RBC: 4.76 x10E6/uL (ref 3.77–5.28)
RDW: 15.7 % — ABNORMAL HIGH (ref 11.7–15.4)
WBC: 9.2 10*3/uL (ref 3.4–10.8)

## 2021-01-15 LAB — TSH: TSH: 1 u[IU]/mL (ref 0.450–4.500)

## 2021-02-01 ENCOUNTER — Encounter: Payer: Self-pay | Admitting: Family Medicine

## 2021-02-01 ENCOUNTER — Other Ambulatory Visit: Payer: Self-pay

## 2021-02-01 ENCOUNTER — Ambulatory Visit (INDEPENDENT_AMBULATORY_CARE_PROVIDER_SITE_OTHER): Payer: Medicaid Other | Admitting: Family Medicine

## 2021-02-01 ENCOUNTER — Other Ambulatory Visit (HOSPITAL_COMMUNITY): Payer: Self-pay

## 2021-02-01 VITALS — BP 129/80 | HR 98 | Temp 98.1°F | Resp 16 | Wt 265.8 lb

## 2021-02-01 DIAGNOSIS — Z6841 Body Mass Index (BMI) 40.0 and over, adult: Secondary | ICD-10-CM | POA: Diagnosis not present

## 2021-02-01 MED ORDER — PHENTERMINE HCL 37.5 MG PO CAPS
37.5000 mg | ORAL_CAPSULE | ORAL | 0 refills | Status: DC
  Filled 2021-02-01: qty 30, 30d supply, fill #0

## 2021-02-01 NOTE — Progress Notes (Signed)
Patient is here to talk about her weight loss journey.Patient has no other concerns today

## 2021-02-01 NOTE — Progress Notes (Signed)
Established Patient Office Visit  Subjective:  Patient ID: Lindsay Perez, female    DOB: 05/14/1990  Age: 30 y.o. MRN: [id number]  CC:  Chief Complaint  Patient presents with   Weight Loss    HPI Nihal Doan presents for complaint of desiring weight loss. She reports that she has utilized several methods in the past.   Past Medical History:  Diagnosis Date   Anemia    as a child   Asthma    as a child   Chlamydia    GERD (gastroesophageal reflux disease)    Headache(784.0)    migranes   Seasonal allergies    Trichomonas infection     Past Surgical History:  Procedure Laterality Date   CESAREAN SECTION N/A 10/26/2017   Procedure: CESAREAN SECTION;  Surgeon: Marlow Baars, MD;  Location: [facility];  Service: Obstetrics;  Laterality: N/A;   CESAREAN SECTION WITH BILATERAL TUBAL LIGATION Bilateral 04/28/2020   Procedure: REPEAT CESAREAN SECTION WITH BILATERAL TUBAL LIGATION;  Surgeon: Marlow Baars, MD;  Location: [facility];  Service: Obstetrics;  Laterality: Bilateral;   WISDOM TOOTH EXTRACTION      Family History  Adopted: Yes  Problem Relation Age of Onset   Drug abuse Mother    Mental illness Brother    Mental retardation Brother     Social History   Socioeconomic History   Marital status: Single    Spouse name: Not on file   Number of children: 0    Years of education: some colle   Highest education level: Not on file  Occupational History   Occupation: does many duties     Comment: Conservation officer, nature   Tobacco Use   Smoking status: Never   Smokeless tobacco: Never  Vaping Use   Vaping Use: Never used  Substance and Sexual Activity   Alcohol use: Not Currently    Alcohol/week: 19.0 standard drinks    Types: 5 Glasses of wine, 14 Shots of liquor per week   Drug use: Not Currently    Frequency: 7.0 times per week    Types: Marijuana    Comment: last use in Jan 2019   Sexual activity: Yes  Other Topics Concern   Not on file   Social History Narrative   Lives with nephew (22).    Some college, starting back in January.   [name], age 77.        Social Determinants of Health   Financial Resource Strain: Not on file  Food Insecurity: Not on file  Transportation Needs: Not on file  Physical Activity: Not on file  Stress: Not on file  Social Connections: Not on file  Intimate Partner Violence: Not on file    ROS Review of Systems  All other systems reviewed and are negative.  Objective:   Today's Vitals: BP 129/80    Pulse 98    Temp 98.1 F (36.7 C) (Oral)    Resp 16    Wt 265 lb 12.8 oz (120.6 kg)    SpO2 97%    BMI 49.01 kg/m   Physical Exam Vitals and nursing note reviewed.  Constitutional:      General: She is not in acute distress.    Appearance: She is obese.  Cardiovascular:     Rate and Rhythm: Normal rate and regular rhythm.  Pulmonary:     Effort: Pulmonary effort is normal.     Breath sounds: Normal breath sounds.  Neurological:  General: No focal deficit present.     Mental Status: She is alert and oriented to person, place, and time.    Assessment & Plan:   1. Class 3 severe obesity due to excess calories without serious comorbidity with body mass index (BMI) of 45.0 to 49.9 in adult [facility]) Discussed dietary and activity options. Phentermine prescribed with goal of 5lbs minimum weight loss in next 30 days. Will monitor   Outpatient Encounter Medications as of 02/01/2021  Medication Sig   albuterol (VENTOLIN HFA) 108 (90 Base) MCG/ACT inhaler Inhale 2 puffs into the lungs every 6 (six) hours as needed for wheezing or shortness of breath.   ibuprofen (ADVIL) 600 MG tablet Take 1 tablet (600 mg total) by mouth every 6 (six) hours as needed.   NIFEdipine (PROCARDIA XL) 30 MG 24 hr tablet Take 1 tablet (30 mg total) by mouth daily.   phentermine 37.5 MG capsule Take 1 capsule (37.5 mg total) by mouth every morning.   SUMAtriptan (IMITREX) 25 MG tablet Take 1  tablet (25 mg total) by mouth every 2 (two) hours as needed for migraine. May repeat in 2 hours if headache persists or recurs. Do not take more than 2 tablets in 24 hours.   furosemide (LASIX) 20 MG tablet Take 0.5 tablets (10 mg total) by mouth daily for 2 days.   No facility-administered encounter medications on file as of 02/01/2021.    Follow-up: Return in about 4 weeks (around 03/01/2021) for follow up.   [name], MD

## 2021-02-02 ENCOUNTER — Encounter: Payer: Self-pay | Admitting: Family Medicine

## 2021-02-02 ENCOUNTER — Ambulatory Visit: Payer: Medicaid Other | Admitting: Family Medicine

## 2021-03-02 DIAGNOSIS — H539 Unspecified visual disturbance: Secondary | ICD-10-CM | POA: Diagnosis not present

## 2021-03-02 DIAGNOSIS — H5789 Other specified disorders of eye and adnexa: Secondary | ICD-10-CM | POA: Diagnosis not present

## 2021-03-03 ENCOUNTER — Other Ambulatory Visit (HOSPITAL_COMMUNITY): Payer: Self-pay

## 2021-03-03 MED ORDER — PREDNISOLONE ACETATE 1 % OP SUSP
OPHTHALMIC | 4 refills | Status: DC
  Filled 2021-03-03: qty 5, 30d supply, fill #0

## 2021-03-04 ENCOUNTER — Ambulatory Visit: Payer: Medicaid Other | Admitting: Family Medicine

## 2021-03-09 ENCOUNTER — Ambulatory Visit (INDEPENDENT_AMBULATORY_CARE_PROVIDER_SITE_OTHER): Payer: Medicaid Other | Admitting: Family Medicine

## 2021-03-09 ENCOUNTER — Encounter: Payer: Self-pay | Admitting: Family Medicine

## 2021-03-09 ENCOUNTER — Other Ambulatory Visit: Payer: Self-pay

## 2021-03-09 ENCOUNTER — Other Ambulatory Visit (HOSPITAL_COMMUNITY): Payer: Self-pay

## 2021-03-09 VITALS — BP 120/81 | HR 71 | Temp 98.3°F | Resp 16 | Wt 253.6 lb

## 2021-03-09 DIAGNOSIS — Z6841 Body Mass Index (BMI) 40.0 and over, adult: Secondary | ICD-10-CM

## 2021-03-09 DIAGNOSIS — G43909 Migraine, unspecified, not intractable, without status migrainosus: Secondary | ICD-10-CM | POA: Diagnosis not present

## 2021-03-09 DIAGNOSIS — M545 Low back pain, unspecified: Secondary | ICD-10-CM | POA: Diagnosis not present

## 2021-03-09 MED ORDER — PHENTERMINE HCL 37.5 MG PO CAPS
37.5000 mg | ORAL_CAPSULE | ORAL | 0 refills | Status: DC
  Filled 2021-03-09: qty 30, 30d supply, fill #0

## 2021-03-09 NOTE — Progress Notes (Signed)
Established Patient Office Visit  Subjective:  Patient ID: Lindsay Perez, female    DOB: 30-Mar-1990  Age: 31 y.o. MRN: [id number]  CC:  Chief Complaint  Patient presents with   Follow-up   Weight Check    HPI Lindsay Perez presents for follow up of obesity. Paitent reports that she has been following dietary recommendations   Past Medical History:  Diagnosis Date   Anemia    as a child   Asthma    as a child   Chlamydia    GERD (gastroesophageal reflux disease)    Headache(784.0)    migranes   Seasonal allergies    Trichomonas infection     Past Surgical History:  Procedure Laterality Date   CESAREAN SECTION N/A 10/26/2017   Procedure: CESAREAN SECTION;  Surgeon: Jerelyn Charles, MD;  Location: [facility];  Service: Obstetrics;  Laterality: N/A;   CESAREAN SECTION WITH BILATERAL TUBAL LIGATION Bilateral 04/28/2020   Procedure: REPEAT CESAREAN SECTION WITH BILATERAL TUBAL LIGATION;  Surgeon: Jerelyn Charles, MD;  Location: [facility];  Service: Obstetrics;  Laterality: Bilateral;   WISDOM TOOTH EXTRACTION      Family History  Adopted: Yes  Problem Relation Age of Onset   Drug abuse Mother    Mental illness Brother    Mental retardation Brother     Social History   Socioeconomic History   Marital status: Single    Spouse name: Not on file   Number of children: 0    Years of education: some colle   Highest education level: Not on file  Occupational History   Occupation: does many duties     Comment: Corporate treasurer   Tobacco Use   Smoking status: Never   Smokeless tobacco: Never  Vaping Use   Vaping Use: Never used  Substance and Sexual Activity   Alcohol use: Not Currently    Alcohol/week: 19.0 standard drinks    Types: 5 Glasses of wine, 14 Shots of liquor per week   Drug use: Not Currently    Frequency: 7.0 times per week    Types: Marijuana    Comment: last use in Jan 2019   Sexual activity: Yes  Other Topics Concern   Not on  file  Social History Narrative   Lives with nephew (108).    Some college, starting back in January.   [name], age 15.        Social Determinants of Health   Financial Resource Strain: Not on file  Food Insecurity: Not on file  Transportation Needs: Not on file  Physical Activity: Not on file  Stress: Not on file  Social Connections: Not on file  Intimate Partner Violence: Not on file    ROS Review of Systems  All other systems reviewed and are negative.  Objective:   Today's Vitals: BP 120/81    Pulse 71    Temp 98.3 F (36.8 C) (Oral)    Resp 16    Wt 253 lb 9.6 oz (115 kg)    SpO2 99%    BMI 46.76 kg/m   Physical Exam Vitals and nursing note reviewed.  Constitutional:      General: She is not in acute distress.    Appearance: She is obese.  Cardiovascular:     Rate and Rhythm: Normal rate and regular rhythm.  Pulmonary:     Effort: Pulmonary effort is normal.     Breath sounds: Normal breath sounds.  Neurological:  General: No focal deficit present.     Mental Status: She is alert and oriented to person, place, and time.    Assessment & Plan:   1. Class 3 severe obesity due to excess calories without serious comorbidity with body mass index (BMI) of 45.0 to 49.9 in adult (HCC) 12 lb weight loss. Patient prescribed phentermine. Will monitor  2. Migraine without status migrainosus, not intractable, unspecified migraine type Keep scheduled appt with consultant for further eval/mgt  3. Midline low back pain without sciatica, unspecified chronicity Continue p[resent management.   Outpatient Encounter Medications as of 03/09/2021  Medication Sig   albuterol (VENTOLIN HFA) 108 (90 Base) MCG/ACT inhaler Inhale 2 puffs into the lungs every 6 (six) hours as needed for wheezing or shortness of breath.   ibuprofen (ADVIL) 600 MG tablet Take 1 tablet (600 mg total) by mouth every 6 (six) hours as needed.   NIFEdipine (PROCARDIA XL) 30 MG 24 hr tablet  Take 1 tablet (30 mg total) by mouth daily.   phentermine 37.5 MG capsule Take 1 capsule (37.5 mg total) by mouth every morning.   prednisoLONE acetate (PRED FORTE) 1 % ophthalmic suspension Instill 1 drop into each eye 4 times daily as directed   SUMAtriptan (IMITREX) 25 MG tablet Take 1 tablet (25 mg total) by mouth every 2 (two) hours as needed for migraine. May repeat in 2 hours if headache persists or recurs. Do not take more than 2 tablets in 24 hours.   [DISCONTINUED] phentermine 37.5 MG capsule Take 1 capsule (37.5 mg total) by mouth every morning.   furosemide (LASIX) 20 MG tablet Take 0.5 tablets (10 mg total) by mouth daily for 2 days.   No facility-administered encounter medications on file as of 03/09/2021.    Follow-up: Return in about 4 weeks (around 04/06/2021) for follow up.   Becky Sax, MD

## 2021-03-09 NOTE — Progress Notes (Signed)
Patient is here for weight check from medication phentermine. Patient has been having headaches and lower back pain. Patient is concern as to where these issues may be coming from

## 2021-03-15 ENCOUNTER — Other Ambulatory Visit (HOSPITAL_COMMUNITY): Payer: Self-pay

## 2021-04-06 ENCOUNTER — Ambulatory Visit: Payer: Medicaid Other | Admitting: Family Medicine

## 2021-04-15 ENCOUNTER — Other Ambulatory Visit: Payer: Self-pay

## 2021-04-15 ENCOUNTER — Ambulatory Visit (INDEPENDENT_AMBULATORY_CARE_PROVIDER_SITE_OTHER): Payer: No Typology Code available for payment source | Admitting: Family Medicine

## 2021-04-15 ENCOUNTER — Encounter: Payer: Self-pay | Admitting: Family Medicine

## 2021-04-15 VITALS — BP 124/77 | HR 101 | Temp 98.1°F | Resp 16 | Wt 246.8 lb

## 2021-04-15 DIAGNOSIS — Z6841 Body Mass Index (BMI) 40.0 and over, adult: Secondary | ICD-10-CM | POA: Diagnosis not present

## 2021-04-15 NOTE — Progress Notes (Signed)
? ?Established Patient Office Visit ? ?Subjective:  ?Patient ID: Lindsay Perez, female    DOB: Feb 15, 1991  Age: 31 y.o. MRN: [id number] ? ?CC:  ?Chief Complaint  ?Patient presents with  ? Weight Check  ? ? ?HPI ?Lindsay Perez presents for follow up of wt loss. Denies acute complaints or concerns.  ? ?Past Medical History:  ?Diagnosis Date  ? Anemia   ? as a child  ? Asthma   ? as a child  ? Chlamydia   ? GERD (gastroesophageal reflux disease)   ? Headache(784.0)   ? migranes  ? Seasonal allergies   ? Trichomonas infection   ? ? ?Past Surgical History:  ?Procedure Laterality Date  ? CESAREAN SECTION N/A 10/26/2017  ? Procedure: CESAREAN SECTION;  Surgeon: Marlow Baars, MD;  Location: [facility] BIRTHING SUITES;  Service: Obstetrics;  Laterality: N/A;  ? CESAREAN SECTION WITH BILATERAL TUBAL LIGATION Bilateral 04/28/2020  ? Procedure: REPEAT CESAREAN SECTION WITH BILATERAL TUBAL LIGATION;  Surgeon: Marlow Baars, MD;  Location: [facility] LD ORS;  Service: Obstetrics;  Laterality: Bilateral;  ? WISDOM TOOTH EXTRACTION    ? ? ?Family History  ?Adopted: Yes  ?Problem Relation Age of Onset  ? Drug abuse Mother   ? Mental illness Brother   ? Mental retardation Brother   ? ? ?Social History  ? ?Socioeconomic History  ? Marital status: Single  ?  Spouse name: Not on file  ? Number of children: 0   ? Years of education: some colle  ? Highest education level: Not on file  ?Occupational History  ? Occupation: does many duties   ?  Comment: [employer]   ?Tobacco Use  ? Smoking status: Never  ? Smokeless tobacco: Never  ?Vaping Use  ? Vaping Use: Never used  ?Substance and Sexual Activity  ? Alcohol use: Not Currently  ?  Alcohol/week: 19.0 standard drinks  ?  Types: 5 Glasses of wine, 14 Shots of liquor per week  ? Drug use: Not Currently  ?  Frequency: 7.0 times per week  ?  Types: Marijuana  ?  Comment: last use in Jan 2019  ? Sexual activity: Yes  ?Other Topics Concern  ? Not on file  ?Social History Narrative  ? Lives with  nephew (22).   ? Some college, starting back in January.  ? [name], age 34.   ?    ? ?Social Determinants of Health  ? ?Financial Resource Strain: Not on file  ?Food Insecurity: Not on file  ?Transportation Needs: Not on file  ?Physical Activity: Not on file  ?Stress: Not on file  ?Social Connections: Not on file  ?Intimate Partner Violence: Not on file  ? ? ?ROS ?Review of Systems  ?All other systems reviewed and are negative. ? ?Objective:  ? ?Today's Vitals: BP 124/77   Pulse (!) 101   Temp 98.1 ?F (36.7 ?C) (Oral)   Resp 16   Wt 246 lb 12.8 oz (111.9 kg)   BMI 45.51 kg/m?  ? ?Physical Exam ?Vitals and nursing note reviewed.  ?Constitutional:   ?   General: She is not in acute distress. ?   Appearance: She is obese.  ?Cardiovascular:  ?   Rate and Rhythm: Normal rate and regular rhythm.  ?Pulmonary:  ?   Effort: Pulmonary effort is normal.  ?   Breath sounds: Normal breath sounds.  ?Neurological:  ?   General: No focal deficit present.  ?   Mental Status: She is alert and  oriented to person, place, and time.  ? ? ?Assessment & Plan:  ? ?1. Class 3 severe obesity due to excess calories without serious comorbidity with body mass index (BMI) of 45.0 to 49.9 in adult [facility]) ?Patient doing well with a 7 lb wt loss in the past month and about 19 lbs in the past 2 months. Continue present management and goal is 4-6lbs wt loss in the next 30 days. monitor ? ?Outpatient Encounter Medications as of 04/15/2021  ?Medication Sig  ? albuterol (VENTOLIN HFA) 108 (90 Base) MCG/ACT inhaler Inhale 2 puffs into the lungs every 6 (six) hours as needed for wheezing or shortness of breath.  ? ibuprofen (ADVIL) 600 MG tablet Take 1 tablet (600 mg total) by mouth every 6 (six) hours as needed.  ? NIFEdipine (PROCARDIA XL) 30 MG 24 hr tablet Take 1 tablet (30 mg total) by mouth daily.  ? phentermine 37.5 MG capsule Take 1 capsule (37.5 mg total) by mouth every morning.  ? prednisoLONE acetate (PRED FORTE) 1 % ophthalmic  suspension Instill 1 drop into each eye 4 times daily as directed  ? SUMAtriptan (IMITREX) 25 MG tablet Take 1 tablet (25 mg total) by mouth every 2 (two) hours as needed for migraine. May repeat in 2 hours if headache persists or recurs. Do not take more than 2 tablets in 24 hours.  ? furosemide (LASIX) 20 MG tablet Take 0.5 tablets (10 mg total) by mouth daily for 2 days.  ? ?No facility-administered encounter medications on file as of 04/15/2021.  ? ? ?Follow-up: No follow-ups on file.  ? ?[name], MD ? ?

## 2021-04-15 NOTE — Progress Notes (Signed)
Patient is here for weight check. Patient said that her choices are not the best and she is doing better about that. Patient has loss a little weight ? ?

## 2021-04-16 ENCOUNTER — Other Ambulatory Visit: Payer: Self-pay | Admitting: Family Medicine

## 2021-04-16 ENCOUNTER — Other Ambulatory Visit (HOSPITAL_COMMUNITY): Payer: Self-pay

## 2021-04-19 ENCOUNTER — Other Ambulatory Visit (HOSPITAL_COMMUNITY): Payer: Self-pay

## 2021-04-20 ENCOUNTER — Other Ambulatory Visit: Payer: Self-pay | Admitting: Family Medicine

## 2021-04-20 ENCOUNTER — Other Ambulatory Visit (HOSPITAL_COMMUNITY): Payer: Self-pay

## 2021-04-20 MED ORDER — PHENTERMINE HCL 37.5 MG PO CAPS: 37.5000 mg | ORAL_CAPSULE | ORAL | 0 refills | Status: DC

## 2021-04-22 ENCOUNTER — Other Ambulatory Visit (HOSPITAL_COMMUNITY): Payer: Self-pay

## 2021-04-22 MED ORDER — PHENTERMINE HCL 37.5 MG PO CAPS
37.5000 mg | ORAL_CAPSULE | ORAL | 0 refills | Status: DC
  Filled 2021-04-22: qty 30, 30d supply, fill #0

## 2021-05-13 ENCOUNTER — Encounter: Payer: Self-pay | Admitting: Family Medicine

## 2021-05-13 ENCOUNTER — Ambulatory Visit (INDEPENDENT_AMBULATORY_CARE_PROVIDER_SITE_OTHER): Payer: No Typology Code available for payment source | Admitting: Family Medicine

## 2021-05-13 ENCOUNTER — Other Ambulatory Visit (HOSPITAL_COMMUNITY): Payer: Self-pay

## 2021-05-13 DIAGNOSIS — Z6841 Body Mass Index (BMI) 40.0 and over, adult: Secondary | ICD-10-CM | POA: Diagnosis not present

## 2021-05-13 MED ORDER — PHENTERMINE HCL 37.5 MG PO CAPS: 37.5000 mg | ORAL_CAPSULE | ORAL | 0 refills | Status: DC

## 2021-05-13 MED ORDER — POLYETHYLENE GLYCOL 3350 17 GM/SCOOP PO POWD
17.0000 g | Freq: Every day | ORAL | 1 refills | Status: AC
  Filled 2021-05-13: qty 476, 28d supply, fill #0

## 2021-05-13 MED ORDER — PHENTERMINE HCL 37.5 MG PO CAPS
37.5000 mg | ORAL_CAPSULE | ORAL | 0 refills | Status: DC
  Filled 2021-05-13 – 2021-05-25 (×2): qty 30, 30d supply, fill #0

## 2021-05-13 NOTE — Progress Notes (Signed)
? ?Established Patient Office Visit ? ?Subjective:  ?Patient ID: Lindsay Perez, female    DOB: [birth year]  Age: 31 y.o. MRN: [id number] ? ?CC:  ?Chief Complaint  ?Patient presents with  ? Constipation  ? Weight Check  ? ? ?HPI ?Lindsay Perez presents for follow up of obesity. She denies acute complaints or concerns.  ? ?Past Medical History:  ?Diagnosis Date  ? Anemia   ? as a child  ? Asthma   ? as a child  ? Chlamydia   ? GERD (gastroesophageal reflux disease)   ? Headache(784.0)   ? migranes  ? Seasonal allergies   ? Trichomonas infection   ? ? ?Past Surgical History:  ?Procedure Laterality Date  ? CESAREAN SECTION N/A 10/26/2017  ? Procedure: CESAREAN SECTION;  Surgeon: Marlow Baars, MD;  Location: [facility] BIRTHING SUITES;  Service: Obstetrics;  Laterality: N/A;  ? CESAREAN SECTION WITH BILATERAL TUBAL LIGATION Bilateral 04/28/2020  ? Procedure: REPEAT CESAREAN SECTION WITH BILATERAL TUBAL LIGATION;  Surgeon: Marlow Baars, MD;  Location: [facility] LD ORS;  Service: Obstetrics;  Laterality: Bilateral;  ? WISDOM TOOTH EXTRACTION    ? ? ?Family History  ?Adopted: Yes  ?Problem Relation Age of Onset  ? Drug abuse Mother   ? Mental illness Brother   ? Mental retardation Brother   ? ? ?Social History  ? ?Socioeconomic History  ? Marital status: Single  ?  Spouse name: Not on file  ? Number of children: 0   ? Years of education: some colle  ? Highest education level: Not on file  ?Occupational History  ? Occupation: does many duties   ?  Comment: [employer]   ?Tobacco Use  ? Smoking status: Never  ? Smokeless tobacco: Never  ?Vaping Use  ? Vaping Use: Never used  ?Substance and Sexual Activity  ? Alcohol use: Not Currently  ?  Alcohol/week: 19.0 standard drinks  ?  Types: 5 Glasses of wine, 14 Shots of liquor per week  ? Drug use: Not Currently  ?  Frequency: 7.0 times per week  ?  Types: Marijuana  ?  Comment: last use in Jan 2019  ? Sexual activity: Yes  ?Other Topics Concern  ? Not on file  ?Social History  Narrative  ? Lives with nephew (22).   ? Some college, starting back in January.  ? [name], age 29.   ?    ? ?Social Determinants of Health  ? ?Financial Resource Strain: Not on file  ?Food Insecurity: Not on file  ?Transportation Needs: Not on file  ?Physical Activity: Not on file  ?Stress: Not on file  ?Social Connections: Not on file  ?Intimate Partner Violence: Not on file  ? ? ?ROS ?Review of Systems  ?All other systems reviewed and are negative. ? ?Objective:  ? ?Today's Vitals: BP 123/80   Pulse 85   Temp 98 ?F (36.7 ?C) (Oral)   Resp 16   Wt 244 lb 3.2 oz (110.8 kg)   SpO2 95%   BMI 45.03 kg/m?  ? ?Physical Exam ?Vitals and nursing note reviewed.  ?Constitutional:   ?   General: She is not in acute distress. ?   Appearance: She is obese.  ?Cardiovascular:  ?   Rate and Rhythm: Normal rate and regular rhythm.  ?Pulmonary:  ?   Effort: Pulmonary effort is normal.  ?   Breath sounds: Normal breath sounds.  ?Neurological:  ?   General: No focal deficit present.  ?  Mental Status: She is alert and oriented to person, place, and time.  ? ? ?Assessment & Plan:  ? ?1. Class 3 severe obesity due to excess calories without serious comorbidity with body mass index (BMI) of 45.0 to 49.9 in adult [facility]) ?Patient's weight continues to trend downward. Meds refilled. monitor ? ? ? ? ?Outpatient Encounter Medications as of 05/13/2021  ?Medication Sig  ? albuterol (VENTOLIN HFA) 108 (90 Base) MCG/ACT inhaler Inhale 2 puffs into the lungs every 6 (six) hours as needed for wheezing or shortness of breath.  ? ibuprofen (ADVIL) 600 MG tablet Take 1 tablet (600 mg total) by mouth every 6 (six) hours as needed.  ? NIFEdipine (PROCARDIA XL) 30 MG 24 hr tablet Take 1 tablet (30 mg total) by mouth daily.  ? phentermine 37.5 MG capsule Take 1 capsule (37.5 mg total) by mouth every morning.  ? prednisoLONE acetate (PRED FORTE) 1 % ophthalmic suspension Instill 1 drop into each eye 4 times daily as directed  ?  SUMAtriptan (IMITREX) 25 MG tablet Take 1 tablet (25 mg total) by mouth every 2 (two) hours as needed for migraine. May repeat in 2 hours if headache persists or recurs. Do not take more than 2 tablets in 24 hours.  ? Acetaminophen (TYLENOL) 325 MG CAPS Tylenol  ? furosemide (LASIX) 20 MG tablet Take 0.5 tablets (10 mg total) by mouth daily for 2 days.  ? Prenatal Vit-Fe Fumarate-FA (PRENATAL 1+1 PO) Prenatal  ? ?No facility-administered encounter medications on file as of 05/13/2021.  ? ? ?Follow-up: No follow-ups on file.  ? ?[name], MD ? ?

## 2021-05-14 ENCOUNTER — Encounter: Payer: Self-pay | Admitting: Family Medicine

## 2021-05-25 ENCOUNTER — Other Ambulatory Visit (HOSPITAL_COMMUNITY): Payer: Self-pay

## 2021-06-15 ENCOUNTER — Ambulatory Visit: Payer: No Typology Code available for payment source | Admitting: Family Medicine

## 2021-06-23 ENCOUNTER — Ambulatory Visit: Payer: No Typology Code available for payment source | Admitting: Family Medicine

## 2021-07-21 ENCOUNTER — Ambulatory Visit: Payer: No Typology Code available for payment source | Admitting: Family Medicine

## 2021-07-22 ENCOUNTER — Ambulatory Visit: Payer: No Typology Code available for payment source | Admitting: Family Medicine

## 2021-07-26 ENCOUNTER — Other Ambulatory Visit (HOSPITAL_COMMUNITY): Payer: Self-pay

## 2021-07-26 MED ORDER — AMOXICILLIN-POT CLAVULANATE 875-125 MG PO TABS
ORAL_TABLET | ORAL | 0 refills | Status: DC
  Filled 2021-07-26: qty 14, 7d supply, fill #0

## 2021-07-26 MED ORDER — IBUPROFEN 800 MG PO TABS
ORAL_TABLET | ORAL | 0 refills | Status: DC
  Filled 2021-07-26: qty 30, 7d supply, fill #0

## 2021-08-03 ENCOUNTER — Encounter: Payer: Self-pay | Admitting: Family Medicine

## 2021-08-03 ENCOUNTER — Other Ambulatory Visit (HOSPITAL_COMMUNITY): Payer: Self-pay

## 2021-08-03 ENCOUNTER — Ambulatory Visit (INDEPENDENT_AMBULATORY_CARE_PROVIDER_SITE_OTHER): Payer: No Typology Code available for payment source | Admitting: Family Medicine

## 2021-08-03 VITALS — BP 124/84 | HR 68 | Temp 98.1°F | Resp 16 | Wt 239.6 lb

## 2021-08-03 DIAGNOSIS — B3731 Acute candidiasis of vulva and vagina: Secondary | ICD-10-CM | POA: Diagnosis not present

## 2021-08-03 DIAGNOSIS — Z6841 Body Mass Index (BMI) 40.0 and over, adult: Secondary | ICD-10-CM | POA: Diagnosis not present

## 2021-08-03 MED ORDER — PHENTERMINE HCL 37.5 MG PO CAPS
37.5000 mg | ORAL_CAPSULE | ORAL | 0 refills | Status: DC
  Filled 2021-08-03: qty 30, 30d supply, fill #0

## 2021-08-03 MED ORDER — FLUCONAZOLE 150 MG PO TABS
150.0000 mg | ORAL_TABLET | Freq: Once | ORAL | 0 refills | Status: AC
  Filled 2021-08-03: qty 1, 1d supply, fill #0

## 2021-08-03 NOTE — Progress Notes (Signed)
Patient is here for weight check. Patient has been on phentermine. Patient has yeast infection due to ABX that she is on for tooth pain

## 2021-08-05 ENCOUNTER — Encounter: Payer: Self-pay | Admitting: Family Medicine

## 2021-08-16 ENCOUNTER — Other Ambulatory Visit (HOSPITAL_COMMUNITY): Payer: Self-pay

## 2021-08-16 MED ORDER — IBUPROFEN 600 MG PO TABS
ORAL_TABLET | ORAL | 0 refills | Status: DC
  Filled 2021-08-16: qty 10, 3d supply, fill #0

## 2021-08-24 ENCOUNTER — Other Ambulatory Visit (HOSPITAL_COMMUNITY): Payer: Self-pay

## 2021-08-24 MED ORDER — MEDROXYPROGESTERONE ACETATE 10 MG PO TABS
ORAL_TABLET | ORAL | 3 refills | Status: DC
  Filled 2021-08-24: qty 30, 30d supply, fill #0

## 2021-08-24 MED ORDER — NAPROXEN 500 MG PO TABS
ORAL_TABLET | ORAL | 0 refills | Status: DC
  Filled 2021-08-24: qty 20, 10d supply, fill #0

## 2021-08-27 ENCOUNTER — Other Ambulatory Visit (HOSPITAL_COMMUNITY): Payer: Self-pay

## 2021-08-31 ENCOUNTER — Ambulatory Visit (INDEPENDENT_AMBULATORY_CARE_PROVIDER_SITE_OTHER): Payer: No Typology Code available for payment source | Admitting: Family Medicine

## 2021-08-31 ENCOUNTER — Encounter: Payer: Self-pay | Admitting: Family Medicine

## 2021-08-31 ENCOUNTER — Other Ambulatory Visit (HOSPITAL_COMMUNITY): Payer: Self-pay

## 2021-08-31 VITALS — BP 123/87 | HR 87 | Temp 98.0°F | Resp 16 | Wt 237.0 lb

## 2021-08-31 DIAGNOSIS — K6289 Other specified diseases of anus and rectum: Secondary | ICD-10-CM

## 2021-08-31 DIAGNOSIS — Z7689 Persons encountering health services in other specified circumstances: Secondary | ICD-10-CM | POA: Diagnosis not present

## 2021-08-31 DIAGNOSIS — Z6841 Body Mass Index (BMI) 40.0 and over, adult: Secondary | ICD-10-CM

## 2021-08-31 MED ORDER — PHENTERMINE HCL 37.5 MG PO CAPS
37.5000 mg | ORAL_CAPSULE | ORAL | 0 refills | Status: DC
  Filled 2021-08-31 – 2021-09-13 (×2): qty 30, 30d supply, fill #0

## 2021-08-31 NOTE — Progress Notes (Signed)
Patient is here for monthly weight check for phentermine Patient came in for monthly weight check. Patient has no other concerns today   Last Weight  Most recent update: 08/31/2021  2:16 PM    Weight  107.5 kg (237 lb)

## 2021-09-01 ENCOUNTER — Encounter: Payer: Self-pay | Admitting: Family Medicine

## 2021-09-01 NOTE — Progress Notes (Signed)
Established Patient Office Visit  Subjective    Patient ID: Lindsay Perez, female    DOB: 19-Sep-1990  Age: 31 y.o. MRN: [id number]  CC:  Chief Complaint  Patient presents with   Weight Loss    HPI [name] presents for weight management. She also reports that she continues with rectal pain. She had history of hemorrhoids in the past.   Outpatient Encounter Medications as of 08/31/2021  Medication Sig   Acetaminophen (TYLENOL) 325 MG CAPS Tylenol   albuterol (VENTOLIN HFA) 108 (90 Base) MCG/ACT inhaler Inhale 2 puffs into the lungs every 6 (six) hours as needed for wheezing or shortness of breath.   amoxicillin-clavulanate (AUGMENTIN) 875-125 MG tablet Take 1 tablet by mouth 2 times daily for 7 days.   ibuprofen (ADVIL) 600 MG tablet Take 1 tablet (600 mg total) by mouth every 6 (six) hours as needed.   ibuprofen (ADVIL) 600 MG tablet Take 1 tablet by mouth every 4 to 6 hours.   ibuprofen (ADVIL) 800 MG tablet Take 1 tablet (800 mg total) by mouth every 6 (six) hours as needed.   medroxyPROGESTERone (PROVERA) 10 MG tablet Take 1 tablet by mouth daily for 10 days to induce menses   naproxen (NAPROSYN) 500 MG tablet Take 1 tablet by mouth twice a day for 10 days.   NIFEdipine (PROCARDIA XL) 30 MG 24 hr tablet Take 1 tablet (30 mg total) by mouth daily.   phentermine 37.5 MG capsule Take 1 capsule (37.5 mg total) by mouth every morning.   polyethylene glycol powder (GLYCOLAX/MIRALAX) 17 GM/SCOOP powder Mix 17 g of powder in 4 oz of water or juice & drink by mouth daily.   prednisoLONE acetate (PRED FORTE) 1 % ophthalmic suspension Instill 1 drop into each eye 4 times daily as directed   Prenatal Vit-Fe Fumarate-FA (PRENATAL 1+1 PO) Prenatal   SUMAtriptan (IMITREX) 25 MG tablet Take 1 tablet (25 mg total) by mouth every 2 (two) hours as needed for migraine. May repeat in 2 hours if headache persists or recurs. Do not take more than 2 tablets in 24 hours.   [DISCONTINUED]  ibuprofen (ADVIL) 600 MG tablet Take 1 tablet by mouth every 6 (six) hours as needed.   [DISCONTINUED] phentermine 37.5 MG capsule Take 1 capsule (37.5 mg total) by mouth every morning.   No facility-administered encounter medications on file as of 08/31/2021.    Past Medical History:  Diagnosis Date   Anemia    as a child   Asthma    as a child   Chlamydia    GERD (gastroesophageal reflux disease)    Headache(784.0)    migranes   Seasonal allergies    Trichomonas infection     Past Surgical History:  Procedure Laterality Date   CESAREAN SECTION N/A 10/26/2017   Procedure: CESAREAN SECTION;  Surgeon: Marlow Baars, MD;  Location: [facility]IRTHING SUIT[facility];  Service: Obstetrics;  Laterality: N/A;   CESAREAN SECTION WITH BILATERAL TUBAL LIGATION Bilateral 04/28/2020   Procedure: REPEAT CESAREAN SECTION WITH BILATERAL TUBAL LIGATION;  Surgeon: Marlow Baars, MD;  Location: [facility] LD ORS;  Service: Obstetrics;  Laterality: Bilateral;   WISDOM TOOTH EXTRACTION      Family History  Adopted: Yes  Problem Relation Age of Onset   Drug abuse Mother    Mental illness Brother    Mental retardation Brother     Social History   Socioeconomic History   Marital status: Single    Spouse name: Not on file  Number of children: 0    Years of education: some colle   Highest education level: Not on file  Occupational History   Occupation: does many duties     Comment: [employer]   Tobacco Use   Smoking status: Never   Smokeless tobacco: Never  Vaping Use   Vaping Use: Never used  Substance and Sexual Activity   Alcohol use: Not Currently    Alcohol/week: 19.0 standard drinks of alcohol    Types: 5 Glasses of wine, 14 Shots of liquor per week   Drug use: Not Currently    Frequency: 7.0 times per week    Types: Marijuana    Comment: last use in Jan 2019   Sexual activity: Yes  Other Topics Concern   Not on file  Social History Narrative   Lives with nephew (22).    Some college,  starting back in January.   [name], age 84.        Social Determinants of Health   Financial Resource Strain: Not on file  Food Insecurity: Not on file  Transportation Needs: Not on file  Physical Activity: Not on file  Stress: Not on file  Social Connections: Not on file  Intimate Partner Violence: Not on file    Review of Systems  Gastrointestinal:  Negative for blood in stool and constipation.  All other systems reviewed and are negative.       Objective    BP 123/87   Pulse 87   Temp 98 F (36.7 C) (Oral)   Resp 16   Wt 237 lb (107.5 kg)   SpO2 97%   BMI 43.70 kg/m   Physical Exam Vitals and nursing note reviewed.  Constitutional:      General: She is not in acute distress.    Appearance: She is obese.  Cardiovascular:     Rate and Rhythm: Normal rate and regular rhythm.  Pulmonary:     Effort: Pulmonary effort is normal.     Breath sounds: Normal breath sounds.  Neurological:     General: No focal deficit present.     Mental Status: She is alert and oriented to person, place, and time.         Assessment & Plan:   1. Encounter for weight management Phentermine refilled.   2. Class 3 severe obesity due to excess calories without serious comorbidity with body mass index (BMI) of 45.0 to 49.9 in adult [facility]) As above  3. Rectal pain Patient no longer constipated. Patient to continue preparation H and referral made to GI for further eval/mgt - Ambulatory referral to Gastroenterology    No follow-ups on file.   [name], MD

## 2021-09-03 ENCOUNTER — Encounter: Payer: Self-pay | Admitting: *Deleted

## 2021-09-03 DIAGNOSIS — Z98891 History of uterine scar from previous surgery: Secondary | ICD-10-CM | POA: Insufficient documentation

## 2021-09-09 ENCOUNTER — Other Ambulatory Visit (HOSPITAL_COMMUNITY): Payer: Self-pay

## 2021-09-13 ENCOUNTER — Other Ambulatory Visit (HOSPITAL_COMMUNITY): Payer: Self-pay

## 2021-09-15 ENCOUNTER — Encounter: Payer: Self-pay | Admitting: Nurse Practitioner

## 2021-09-21 ENCOUNTER — Encounter: Payer: Self-pay | Admitting: *Deleted

## 2021-09-21 ENCOUNTER — Ambulatory Visit: Payer: Self-pay

## 2021-09-21 ENCOUNTER — Other Ambulatory Visit (HOSPITAL_COMMUNITY): Payer: Self-pay

## 2021-09-21 ENCOUNTER — Ambulatory Visit: Payer: Self-pay | Admitting: *Deleted

## 2021-09-21 NOTE — Telephone Encounter (Signed)
Summary: Possible yeast infection   Pt believes she has a yeast infection, she is requesting a refill of what she was prescribed previously for a yeast infection but she does not remember the name   [facility]  [address] [place] [address]  Phone: [phone] Fax: [phone]   [phone]       Attempted to contact patient to review sx of yeast infection. No answer, unable to leave message. Recording , unable to complete call at this time, please try call again later. Please advise if diflucan can be refilled without appt.

## 2021-09-21 NOTE — Telephone Encounter (Signed)
  Chief Complaint: yeast symptoms Symptoms: itching, discharge Frequency: took antibiotic Pertinent Negatives: Patient denies fever Disposition: [] ED /[] Urgent Care (no appt availability in office) / [] Appointment(In office/virtual)/ []  [facility]/ [] Home Care/ [] Refused Recommended Disposition /[] [facility]/ [x]  Follow-up with PCP Additional Notes: Pt returned call. Pt was seen for this 08/03/21 after on antibiotic for dental work. She had further dental work and given antibiotic and now has vaginitis/yeast infection symptoms again, she is asking if Diflucan can be called in again at [facility] outpt pharm. Med on current med list but not available for refill, one time order.   Reason for Disposition  [1] Symptoms of a yeast infection (i.e., itchy, white discharge, not bad smelling) AND [2] feels like prior vaginal yeast infections  Answer Assessment - Initial Assessment Questions 1. SYMPTOM: "What's the main symptom you're concerned about?" (e.g., pain, itching, dryness)     itching 2. LOCATION: "Where is the  vaginal area located?" (e.g., inside/outside, left/right)     Inside and out 3. ONSET: "When did the  symptoms  start?"     Saturday, was on antibiotic for dental work 4. PAIN: "Is there any pain?" If Yes, ask: "How bad is it?" (Scale: 1-10; mild, moderate, severe)   -  MILD (1-3): Doesn't interfere with normal activities.    -  MODERATE (4-7): Interferes with normal activities (e.g., work or school) or awakens from sleep.     -  SEVERE (8-10): Excruciating pain, unable to do any normal activities.     no 5. ITCHING: "Is there any itching?" If Yes, ask: "How bad is it?" (Scale: 1-10; mild, moderate, severe)     moderate 6. CAUSE: "What do you think is causing the discharge?" "Have you had the same problem before? What happened then?"     Cottage cheese 7. OTHER SYMPTOMS: "Do you have any other symptoms?" (e.g., fever, itching, vaginal bleeding, pain with urination,  injury to genital area, vaginal foreign body)     Never had a UTI 8. PREGNANCY: "Is there any chance you are pregnant?" "When was your last menstrual period?"     no  Protocols used: Vaginal Symptoms-A-AH

## 2021-09-21 NOTE — Telephone Encounter (Signed)
Pt is calling to follow up on medication request for diflucan.

## 2021-09-21 NOTE — Telephone Encounter (Signed)
Patient called, left VM to return the call to the office to discuss symptoms with a nurse.  Summary: Possible yeast infection   Pt believes she has a yeast infection, she is requesting a refill of what she was prescribed previously for a yeast infection but she does not remember the name   [facility]  [address] [place] [place] [address]  Phone: [phone] Fax: [phone]   [phone]

## 2021-09-21 NOTE — Telephone Encounter (Signed)
Opened chart to answer question but then the line disconnected.

## 2021-09-22 ENCOUNTER — Other Ambulatory Visit: Payer: Self-pay | Admitting: *Deleted

## 2021-09-22 ENCOUNTER — Other Ambulatory Visit (HOSPITAL_COMMUNITY)
Admission: RE | Admit: 2021-09-22 | Discharge: 2021-09-22 | Disposition: A | Discharge status: Home / Self Care | Payer: No Typology Code available for payment source | Source: Ambulatory Visit | Attending: Family Medicine | Admitting: Family Medicine

## 2021-09-22 ENCOUNTER — Ambulatory Visit: Payer: No Typology Code available for payment source

## 2021-09-22 DIAGNOSIS — B3731 Acute candidiasis of vulva and vagina: Secondary | ICD-10-CM | POA: Insufficient documentation

## 2021-09-22 NOTE — Telephone Encounter (Signed)
Patient given appt 

## 2021-09-22 NOTE — Progress Notes (Signed)
Patient came in for a self swab. Patient c/o possible yeast infection. Patient has itching and vaginal discomfort

## 2021-09-24 ENCOUNTER — Telehealth: Payer: Self-pay | Admitting: *Deleted

## 2021-09-24 LAB — CERVICOVAGINAL ANCILLARY ONLY
Bacterial Vaginitis (gardnerella): POSITIVE — AB
Candida Glabrata: POSITIVE — AB
Candida Vaginitis: NEGATIVE
Chlamydia: NEGATIVE
Comment: NEGATIVE
Comment: NEGATIVE
Comment: NEGATIVE
Comment: NEGATIVE
Comment: NEGATIVE
Comment: NORMAL
Neisseria Gonorrhea: NEGATIVE
Trichomonas: NEGATIVE

## 2021-09-24 NOTE — Telephone Encounter (Signed)
Patient was called  to come by and pick up a sample of Hylafem PH for her bacteria vaginitis symptoms. Patient said she would try.  Left up front for pick up

## 2021-09-28 ENCOUNTER — Other Ambulatory Visit: Payer: Self-pay | Admitting: Family Medicine

## 2021-09-28 ENCOUNTER — Other Ambulatory Visit (HOSPITAL_COMMUNITY): Payer: Self-pay

## 2021-09-28 ENCOUNTER — Ambulatory Visit: Payer: Self-pay | Admitting: *Deleted

## 2021-09-28 MED ORDER — FLUCONAZOLE 150 MG PO TABS
150.0000 mg | ORAL_TABLET | Freq: Once | ORAL | 0 refills | Status: AC
  Filled 2021-09-28: qty 1, 1d supply, fill #0

## 2021-09-28 NOTE — Telephone Encounter (Signed)
Message from Carleene Overlie sent at 09/28/2021  9:32 AM EDT  Summary: discuss medication   Patient inquiring if medication for yeast infection has been sent in to the pharmacy or if the provider is waiting to see her at tomorrow's 8-16 ov to prescribe medication   Please advise           Call History   Type Contact Phone/Fax User  09/28/2021 09:31 AM EDT Phone (Incoming) Lindsay Perez, Lindsay Perez (Self) [phone] Rexene Edison) Donnelly Angelica R   Reason for Disposition  [1] Caller has URGENT medicine question about med that PCP or specialist prescribed AND [2] triager unable to answer question  Answer Assessment - Initial Assessment Questions 1. NAME of MEDICINE: "What medicine(s) are you calling about?"     Medication that was to be called in for the bacterial vaginosis.  I came in and did a self swab.    2. QUESTION: "What is your question?" (e.g., double dose of medicine, side effect)     Did she call in the prescription for the bacterial vaginosis?  Please call to [facility].  If Dr. Andrey Campanile wants to respond via MyChart that would be fine too.   She sent a message to Dr. Andrey Campanile via MyChart about this but hasn't gotten a response.   3. PRESCRIBER: "Who prescribed the medicine?" Reason: if prescribed by specialist, call should be referred to that group.     Dr. Georganna Skeans  4. SYMPTOMS: "Do you have any symptoms?" If Yes, ask: "What symptoms are you having?"  "How bad are the symptoms (e.g., mild, moderate, severe)     N/A 5. PREGNANCY:  "Is there any chance that you are pregnant?" "When was your last menstrual period?"     N/A  She also wanted me to change her appt. From 09/29/2021 because she doesn't have child care. Rescheduled for 10/21/2021 at 1:40 with Dr. Andrey Campanile.  Protocols used: Medication Question Call-A-AH

## 2021-09-29 ENCOUNTER — Ambulatory Visit: Payer: No Typology Code available for payment source | Admitting: Family Medicine

## 2021-09-29 ENCOUNTER — Telehealth: Payer: Self-pay | Admitting: Family Medicine

## 2021-09-29 NOTE — Telephone Encounter (Signed)
Pt received medication for yeast infection but nothing for BV / please advise

## 2021-09-29 NOTE — Telephone Encounter (Signed)
Please fill medication

## 2021-09-30 ENCOUNTER — Other Ambulatory Visit (HOSPITAL_COMMUNITY): Payer: Self-pay

## 2021-09-30 ENCOUNTER — Other Ambulatory Visit: Payer: Self-pay | Admitting: Family Medicine

## 2021-09-30 MED ORDER — METRONIDAZOLE 500 MG PO TABS
500.0000 mg | ORAL_TABLET | Freq: Two times a day (BID) | ORAL | 0 refills | Status: AC
  Filled 2021-09-30: qty 14, 7d supply, fill #0

## 2021-09-30 NOTE — Progress Notes (Signed)
fl

## 2021-10-05 ENCOUNTER — Other Ambulatory Visit (HOSPITAL_COMMUNITY): Payer: Self-pay

## 2021-10-05 MED ORDER — TRAMADOL HCL 50 MG PO TABS
ORAL_TABLET | ORAL | 0 refills | Status: DC
  Filled 2021-10-05: qty 10, 2d supply, fill #0

## 2021-10-05 MED ORDER — IBUPROFEN 800 MG PO TABS
ORAL_TABLET | ORAL | 0 refills | Status: AC
  Filled 2021-10-05: qty 18, 4d supply, fill #0

## 2021-10-06 ENCOUNTER — Other Ambulatory Visit (HOSPITAL_COMMUNITY): Payer: Self-pay

## 2021-10-13 ENCOUNTER — Encounter: Payer: Self-pay | Admitting: Nurse Practitioner

## 2021-10-13 ENCOUNTER — Ambulatory Visit (INDEPENDENT_AMBULATORY_CARE_PROVIDER_SITE_OTHER): Payer: No Typology Code available for payment source | Admitting: Nurse Practitioner

## 2021-10-13 VITALS — BP 110/72 | HR 81 | Ht 61.75 in | Wt 235.0 lb

## 2021-10-13 DIAGNOSIS — K602 Anal fissure, unspecified: Secondary | ICD-10-CM | POA: Diagnosis not present

## 2021-10-13 DIAGNOSIS — D649 Anemia, unspecified: Secondary | ICD-10-CM | POA: Diagnosis not present

## 2021-10-13 MED ORDER — AMBULATORY NON FORMULARY MEDICATION: 1 refills | Status: AC

## 2021-10-13 NOTE — Progress Notes (Signed)
10/13/2021 Lindsay Perez [id number] 25-Jun-1990   CHIEF COMPLAINT: Rectal pain   HISTORY OF PRESENT ILLNESS: Lindsay Perez is a 31 year old female with a past medical history of obesity, headaches, anemia GERD. She presents to our office today as referred by Dr. Georganna Skeans for further evaluation regarding rectal pain. S/P C section 2018, C section and tubal ligation 04/2020.  She complains of having rectal pain which is worse after she passes a bowel movement for the past 4 to 5 months.  She intermittently sees a small amount of bright red blood on the toilet tissue and in the toilet water.  She sometimes has rectal pain which occurs when sitting in a chair.  Rectal pain has worsened over the past few weeks for which she takes ibuprofen 600 mg in the morning and 800 mg at bedtime.  She has a history of hemorrhoids since her first pregnancy which are typically well controlled when she took a stool softener and fiber supplement as needed.  She has recently used Preparation H without improvement.  No history of anal trauma.  No fever or chills.  No purulent anorectal discharge.  She sometimes passes clear mucus and other times bloody mucus per the rectum.  She is adopted, no known family history of colorectal cancer or IBD.   She has a history of anemia since childhood throughout her pregnancies.  Her laboratory studies in epic showed she has had microscopic anemia since at least 2014.  Her baseline hemoglobin level is around 10 -11.  Her most recent hemoglobin level was 10.9 on 01/14/2021.  Prior to 04/2020, her periods were fairly regular without heavy bleeding.  Since she underwent a C-section and tubal ligation 04/2020, she reports having heavier menstrual leading for the first 4 days of 8 to 10-day cycle.  She sometimes skips a period but for the past 2 months her cycles have occurred every 30 days.  She has taken oral iron in the past which resulted in constipation so she stopped taking  it.  She has a history of GERD symptoms in 2018.  She was seen by gastroenterologist Dr. Rickard Rhymes with [facility] 07/2016.  At that time, she noted taking NSAIDs for headaches and she was prescribed omeprazole 40 mg daily with instructions to avoid NSAIDs.  Few weeks later, she underwent a positive pregnancy test and had intermittent reflux symptoms during pregnancy which abated after delivery.  She had intermittent reflux symptoms during her second pregnancy which also resolved following delivery 04/2020.  She denies having any current heartburn.  No dysphagia.  She infrequently takes Tums if she experiences any heartburn.  She is on phentermine to lose weight as prescribed by her PCP.       Latest Ref Rng & Units 01/14/2021    4:04 PM 05/08/2020    2:59 PM 04/29/2020    4:34 AM  CBC  WBC 3.4 - 10.8 x10E3/uL 9.2  8.4  12.7   Hemoglobin 11.1 - 15.9 g/dL 78.2  9.5  9.3   Hematocrit 34.0 - 46.6 % 34.0  30.2  29.0   Platelets 150 - 450 x10E3/uL 319  385  269    MCV 71     Latest Ref Rng & Units 01/14/2021    4:04 PM 05/08/2020    2:59 PM 01/29/2020    3:28 PM  CMP  Glucose 70 - 99 mg/dL 91  86  76   BUN 6 - 20 mg/dL 10  10  6  Creatinine 0.57 - 1.00 mg/dL 2.50  5.39  7.67   Sodium 134 - 144 mmol/L 137  137  133   Potassium 3.5 - 5.2 mmol/L 4.2  3.9  3.6   Chloride 96 - 106 mmol/L 99  106  102   CO2 20 - 29 mmol/L 24  24  20    Calcium 8.7 - 10.2 mg/dL 9.5  8.9  8.9   Total Protein 6.0 - 8.5 g/dL 7.5  6.6  6.8   Total Bilirubin 0.0 - 1.2 mg/dL 0.4  0.3  0.5   Alkaline Phos 44 - 121 IU/L 101  86  74   AST 0 - 40 IU/L 23  18  13    ALT 0 - 32 IU/L 24  19  11     TSH 1.0.   Past Medical History:  Diagnosis Date   Anemia    as a child   Asthma    as a child   Chlamydia    GERD (gastroesophageal reflux disease)    Headache(784.0)    migranes   Seasonal allergies    Trichomonas infection    Past Surgical History:  Procedure Laterality Date   CESAREAN SECTION N/A  10/26/2017   Procedure: CESAREAN SECTION;  Surgeon: , MD;  Location: [facility] BIRTHING SUITES;  Service: Obstetrics;  Laterality: N/A;   CESAREAN SECTION WITH BILATERAL TUBAL LIGATION Bilateral 04/28/2020   Procedure: REPEAT CESAREAN SECTION WITH BILATERAL TUBAL LIGATION;  Surgeon: Marlow Baars, MD;  Location: [facility];  Service: Obstetrics;  Laterality: Bilateral;   WISDOM TOOTH EXTRACTION     Social History: She is single.  She has 2 boys.  She is studying accounting.  She is a [employer].  No tobacco. Past marijuana use. infrequent alcohol intake.   Family History: She was adopted.  She reported her biological mother possibly had hepatitis B.  Allergies  Allergen Reactions   Bee Pollen Itching   Shellfish Allergy Anaphylaxis and Swelling      Outpatient Encounter Medications as of 10/13/2021  Medication Sig   albuterol (VENTOLIN HFA) 108 (90 Base) MCG/ACT inhaler Inhale 2 puffs into the lungs every 6 (six) hours as needed for wheezing or shortness of breath.   AMBULATORY NON FORMULARY MEDICATION Medication Name: Diltiazem 2% mixed with Lidocaine 5% Sig: apply a pea size amount inside rectum three times a day   ibuprofen (ADVIL) 800 MG tablet Take 1 tablet by mouth every 6 - 8 hours as needed for pain   medroxyPROGESTERone (PROVERA) 10 MG tablet Take 1 tablet by mouth daily for 10 days to induce menses (Patient taking differently: Take as needed)   phentermine 37.5 MG capsule Take 1 capsule by mouth every morning.   polyethylene glycol powder (GLYCOLAX/MIRALAX) 17 GM/SCOOP powder Mix 17 g of powder in 4 oz of water or juice & drink by mouth daily.   prednisoLONE acetate (PRED FORTE) 1 % ophthalmic suspension Instill 1 drop into each eye 4 times daily as directed   traMADol (ULTRAM) 50 MG tablet Take 1 tablet by mouth every 6 hours as needed for pain   [DISCONTINUED] Acetaminophen (TYLENOL) 325 MG CAPS Tylenol   [DISCONTINUED] amoxicillin-clavulanate (AUGMENTIN)  875-125 MG tablet Take 1 tablet by mouth 2 times daily for 7 days.   [DISCONTINUED] ibuprofen (ADVIL) 600 MG tablet Take 1 tablet (600 mg total) by mouth every 6 (six) hours as needed.   [DISCONTINUED] ibuprofen (ADVIL) 600 MG tablet Take 1 tablet by mouth every 4 to 6 hours.   [  DISCONTINUED] naproxen (NAPROSYN) 500 MG tablet Take 1 tablet by mouth twice a day for 10 days.   [DISCONTINUED] NIFEdipine (PROCARDIA XL) 30 MG 24 hr tablet Take 1 tablet (30 mg total) by mouth daily.   [DISCONTINUED] Prenatal Vit-Fe Fumarate-FA (PRENATAL 1+1 PO) Prenatal   [DISCONTINUED] SUMAtriptan (IMITREX) 25 MG tablet Take 1 tablet (25 mg total) by mouth every 2 (two) hours as needed for migraine. May repeat in 2 hours if headache persists or recurs. Do not take more than 2 tablets in 24 hours.   No facility-administered encounter medications on file as of 10/13/2021.    REVIEW OF SYSTEMS:  Gen: Denies fever, sweats or chills. No weight loss.  CV: Denies chest pain, palpitations or edema. Resp: Denies cough, shortness of breath of hemoptysis.  GI: See HPI. GU : Denies urinary burning, blood in urine, increased urinary frequency or incontinence. GYN: See HPI.  MS: Denies joint pain, muscles aches or weakness. Derm: Denies rash, itchiness, skin lesions or unhealing ulcers. Psych: Denies anxiety and depression.  Heme: Denies bruising, easy bleeding. Neuro:  Denies headaches, dizziness or paresthesias. Endo:  Denies any problems with DM, thyroid or adrenal function.  PHYSICAL EXAM: BP 110/72 Comment: large cuff  Pulse 81   Ht 5' 1.75" (1.568 m)   Wt 235 lb (106.6 kg)   BMI 43.33 kg/m  Wt Readings from Last 3 Encounters:  10/13/21 235 lb (106.6 kg)  08/31/21 237 lb (107.5 kg)  08/03/21 239 lb 9.6 oz (108.7 kg)    General: 31 year old female in no acute distress. Head: Normocephalic and atraumatic. Eyes:  Sclerae non-icteric, conjunctive pink. Ears: Normal auditory acuity. Mouth: Dentition intact. No  ulcers or lesions.  Neck: Supple, no lymphadenopathy or thyromegaly.  Lungs: Clear bilaterally to auscultation without wheezes, crackles or rhonchi. Heart: Regular rate and rhythm. No murmur, rub or gallop appreciated.  Abdomen: Soft, nontender, non distended. No masses. No hepatosplenomegaly. Normoactive bowel sounds x 4 quadrants.  Rectal: Small superficial posterior fissure which used a scant amount of blood on exam, anterior sentinel tag with associated anterior anal fissure without active bleeding but was tender on exam.  Internal hemorrhoids present without prolapse.  No stool or mass in the rectal vault.  P.J. CMA present during exam. Musculoskeletal: Symmetrical with no gross deformities. Skin: Warm and dry. No rash or lesions on visible extremities. Extremities: No edema. Neurological: Alert oriented x 4, no focal deficits.  Psychological:  Alert and cooperative. Normal mood and affect.  ASSESSMENT AND PLAN:  84) 31 year old female with a history of internal hemorrhoids with rectal pain and rectal bleeding, likely due to anal fissures.  -Diltiazem2%/Lidocaine 5% fissure ointment apply a small amount inside the anal opening into the external anal area 3 times daily for 6 weeks -Sitz bath with warm water or soak in tub 10 minutes twice daily -MiraLAX nightly as tolerated -I discussed scheduling a future diagnostic colonoscopy.  Colonoscopy procedure, benefits and risks discussed. -Avoid Ibuprofen/NSAID use -Follow-up with Dr. Tarri Glenn in 6 to 8 weeks, to further discuss endoscopic evaluation at that time -Patient to contact our office if her symptoms worsen  2) Chronic microcytic anemia since at least 2014 -CBC, IBC + Ferritin  -Follow-up with gynecologist regarding menorrhagia -Future colonoscopy as noted above, consider EGD as well if iron deficient  3) GERD -Tums as needed  4) Obesity, on Phentermine  -Continue follow-up with PCP   CC:  Dorna Mai, MD

## 2021-10-13 NOTE — Patient Instructions (Addendum)
1) Avoid Ibuprofen or other NSAID use  2) Tylenol 325mg  two tabs twice daily is ok to take for pain   3) Sitz bath or soak in bathtub with warm water  4) Apply fissure ointment inside the anal opening and to the external anal area three times daily x 6 weeks   5) Follow up with your gynecologist regarding heavy menstrual bleeding   6) Follow up with Dr. in 6 to 8 week to assess for fissure healing and to discuss scheduling a diagnostic colonoscopy   7) Avoid constipation. Take Miralax 1 capful mixed in 8 ounces of water at bed time for constipation as tolerated.  Your provider has requested that you go to the basement level for lab work before leaving today. Press "B" on the elevator. The lab is located at the first door on the left as you exit the elevator.  Due to recent changes in healthcare laws, you may see the results of your imaging and laboratory studies on MyChart before your provider has had a chance to review them.  We understand that in some cases there may be results that are confusing or concerning to you. Not all laboratory results come back in the same time frame and the provider may be waiting for multiple results in order to interpret others.  Please give Orvan Falconer 48 hours in order for your provider to thoroughly review all the results before contacting the office for clarification of your results.   Your provider has prescribed Diltiazem/Lidocaine mixture for you. Please follow the directions written on your prescription bottle or given to you specifically by your provider. Since this is a specialty medication and is not readily available at most local pharmacies, we have sent your prescription to:  [facility] information is below: Address: [address] [place], [place]  [phone]  *Please DO NOT go directly from our office to pick up this medication! Give the pharmacy 1 day to process the prescription as this is compounded and takes  time to make.   I appreciate the opportunity to care for you. [phone], CRNP

## 2021-10-14 NOTE — Progress Notes (Signed)
Reviewed and agree with management plans. ? ?Brenn Gatton L. Slevin Gunby, MD, MPH  ?

## 2021-10-21 ENCOUNTER — Ambulatory Visit: Payer: No Typology Code available for payment source | Admitting: Family Medicine

## 2021-11-11 ENCOUNTER — Ambulatory Visit (INDEPENDENT_AMBULATORY_CARE_PROVIDER_SITE_OTHER): Payer: No Typology Code available for payment source | Admitting: Family Medicine

## 2021-11-11 ENCOUNTER — Encounter: Payer: Self-pay | Admitting: Family Medicine

## 2021-11-11 ENCOUNTER — Other Ambulatory Visit (HOSPITAL_COMMUNITY): Payer: Self-pay

## 2021-11-11 VITALS — BP 129/82 | HR 95 | Temp 98.1°F | Resp 16 | Wt 234.6 lb

## 2021-11-11 DIAGNOSIS — Z7689 Persons encountering health services in other specified circumstances: Secondary | ICD-10-CM | POA: Diagnosis not present

## 2021-11-11 DIAGNOSIS — Z6841 Body Mass Index (BMI) 40.0 and over, adult: Secondary | ICD-10-CM

## 2021-11-11 MED ORDER — PHENTERMINE HCL 37.5 MG PO CAPS
37.5000 mg | ORAL_CAPSULE | ORAL | 0 refills | Status: DC
  Filled 2021-11-11: qty 30, 30d supply, fill #0

## 2021-11-12 ENCOUNTER — Encounter: Payer: Self-pay | Admitting: Family Medicine

## 2021-11-12 NOTE — Progress Notes (Signed)
Established Patient Office Visit  Subjective    Patient ID: Lindsay Perez, female    DOB: 1990-05-31  Age: 31 y.o. MRN: [id number]  CC:  Chief Complaint  Patient presents with   Weight Check    HPI Cadince Hilscher presents for weight management. Patient denies acute complaints.    Outpatient Encounter Medications as of 11/11/2021  Medication Sig   albuterol (VENTOLIN HFA) 108 (90 Base) MCG/ACT inhaler Inhale 2 puffs into the lungs every 6 (six) hours as needed for wheezing or shortness of breath.   AMBULATORY NON FORMULARY MEDICATION Medication Name: Diltiazem 2% mixed with Lidocaine 5% Sig: apply a pea size amount inside rectum three times a day   ibuprofen (ADVIL) 800 MG tablet Take 1 tablet by mouth every 6 - 8 hours as needed for pain   medroxyPROGESTERone (PROVERA) 10 MG tablet Take 1 tablet by mouth daily for 10 days to induce menses (Patient taking differently: Take as needed)   phentermine 37.5 MG capsule Take 1 capsule by mouth every morning.   polyethylene glycol powder (GLYCOLAX/MIRALAX) 17 GM/SCOOP powder Mix 17 g of powder in 4 oz of water or juice & drink by mouth daily.   prednisoLONE acetate (PRED FORTE) 1 % ophthalmic suspension Instill 1 drop into each eye 4 times daily as directed   traMADol (ULTRAM) 50 MG tablet Take 1 tablet by mouth every 6 hours as needed for pain   [DISCONTINUED] phentermine 37.5 MG capsule Take 1 capsule by mouth every morning.   No facility-administered encounter medications on file as of 11/11/2021.    Past Medical History:  Diagnosis Date   Anemia    as a child   Asthma    as a child   Chlamydia    GERD (gastroesophageal reflux disease)    Headache(784.0)    migranes   Seasonal allergies    Trichomonas infection     Past Surgical History:  Procedure Laterality Date   CESAREAN SECTION N/A 10/26/2017   Procedure: CESAREAN SECTION;  Surgeon: Marlow Baars, MD;  Location: [facility] BIRTHING SUITES;  Service: Obstetrics;   Laterality: N/A;   CESAREAN SECTION WITH BILATERAL TUBAL LIGATION Bilateral 04/28/2020   Procedure: REPEAT CESAREAN SECTION WITH BILATERAL TUBAL LIGATION;  Surgeon: Marlow Baars, MD;  Location: [facility] LD ORS;  Service: Obstetrics;  Laterality: Bilateral;   WISDOM TOOTH EXTRACTION      Family History  Adopted: Yes  Problem Relation Age of Onset   Drug abuse Mother    Hepatitis Mother    Other Father        hx unknown    Social History   Socioeconomic History   Marital status: Single    Spouse name: Not on file   Number of children: 2   Years of education: some colle   Highest education level: Not on file  Occupational History    Employer:     Comment: cancer center at [employer]  Tobacco Use   Smoking status: Never   Smokeless tobacco: Never  Vaping Use   Vaping Use: Never used  Substance and Sexual Activity   Alcohol use: Not Currently    Comment: social   Drug use: Not Currently    Frequency: 7.0 times per week    Types: Marijuana    Comment: last use in Jan 2019   Sexual activity: Yes  Other Topics Concern   Not on file  Social History Narrative   Lives with nephew (22).    Some college, starting  back in January.   [name], age 16.        Social Determinants of Health   Financial Resource Strain: Not on file  Food Insecurity: Not on file  Transportation Needs: Not on file  Physical Activity: Not on file  Stress: Not on file  Social Connections: Not on file  Intimate Partner Violence: Not on file    Review of Systems  All other systems reviewed and are negative.       Objective    BP 129/82   Pulse 95   Temp 98.1 F (36.7 C) (Oral)   Resp 16   Wt 234 lb 9.6 oz (106.4 kg)   BMI 43.26 kg/m   Physical Exam Vitals and nursing note reviewed.  Constitutional:      General: She is not in acute distress.    Appearance: She is obese.  Cardiovascular:     Rate and Rhythm: Normal rate and regular rhythm.  Pulmonary:      Effort: Pulmonary effort is normal.     Breath sounds: Normal breath sounds.  Neurological:     General: No focal deficit present.     Mental Status: She is alert and oriented to person, place, and time.         Assessment & Plan:   1. Encounter for weight management Encouraged continued compliance. Meds refilled.   2. Class 3 severe obesity due to excess calories without serious comorbidity with body mass index (BMI) of 40.0 to 44.9 in adult [facility])     Return in about 4 weeks (around 12/09/2021).   Becky Sax, MD

## 2021-11-30 ENCOUNTER — Other Ambulatory Visit (HOSPITAL_COMMUNITY): Payer: Self-pay

## 2021-11-30 ENCOUNTER — Encounter: Payer: Self-pay | Admitting: Gastroenterology

## 2021-11-30 ENCOUNTER — Ambulatory Visit (INDEPENDENT_AMBULATORY_CARE_PROVIDER_SITE_OTHER): Payer: No Typology Code available for payment source | Admitting: Gastroenterology

## 2021-11-30 VITALS — BP 132/86 | HR 81 | Ht 61.0 in | Wt 234.0 lb

## 2021-11-30 DIAGNOSIS — K602 Anal fissure, unspecified: Secondary | ICD-10-CM

## 2021-11-30 DIAGNOSIS — K625 Hemorrhage of anus and rectum: Secondary | ICD-10-CM | POA: Diagnosis not present

## 2021-11-30 DIAGNOSIS — K6289 Other specified diseases of anus and rectum: Secondary | ICD-10-CM | POA: Diagnosis not present

## 2021-11-30 DIAGNOSIS — D649 Anemia, unspecified: Secondary | ICD-10-CM

## 2021-11-30 MED ORDER — NA SULFATE-K SULFATE-MG SULF 17.5-3.13-1.6 GM/177ML PO SOLN
1.0000 | Freq: Once | ORAL | 0 refills | Status: AC
  Filled 2021-11-30: qty 354, 1d supply, fill #0

## 2021-11-30 MED ORDER — AMBULATORY NON FORMULARY MEDICATION: 3 refills | Status: DC

## 2021-11-30 NOTE — Progress Notes (Addendum)
Referring Provider: Dorna Mai, MD Primary Care Physician:  Dorna Mai, MD  Chief complaint: Anal fissure   IMPRESSION:  Suspect that her persistent rectal bleeding is due to incompletely treated anal fissure. Will switch from Diltazem to  Nitroglycerin. Colonoscopy to exclude other concurrent etiologies given the persistent symptoms.    Chronic microcytic anemia since 2014. Labs today.  Previously attributed to menorrhagia.  May need to consider EGD at time of colonoscopy.  Reflux.  Symptomatically controlled with Tums as needed.  PLAN: - Anemia labs - Colonoscopy - High fiber diet, sitz bath, stool softeners -  Start Nitroglycerin 0.125% gel applied to the rectum TID x 6-8 weeks (not to be used with concurrent phosphodiesterase inhibitors) instead of Diltiazem - Referral to Dr. Dema Severin if fissure does not heal with local therapy   HPI: Lindsay Perez is a 31 y.o. female who returns in follow-up.  She was seen by Carl Best 10/13/2021 for anal fissures presenting with rectal pain and bleeding.  She was treated with diltiazem 2%/lidocaine 5% ointment and MiraLAX.  She returns today in follow-up.  This is my first office visit with [facility].  Although somewhat improved, she continues to have rectal pain and blood on the toilet paper with defecation at least 2-3 times a week despite use of Mirlax and Diltiazem ointment.    Past Medical History:  Diagnosis Date   Anemia    as a child   Asthma    as a child   Chlamydia    GERD (gastroesophageal reflux disease)    Headache(784.0)    migranes   Seasonal allergies    Trichomonas infection     Past Surgical History:  Procedure Laterality Date   CESAREAN SECTION N/A 10/26/2017   Procedure: CESAREAN SECTION;  Surgeon: Jerelyn Charles, MD;  Location: [place];  Service: Obstetrics;  Laterality: N/A;   CESAREAN SECTION WITH BILATERAL TUBAL LIGATION Bilateral 04/28/2020   Procedure: REPEAT CESAREAN SECTION  WITH BILATERAL TUBAL LIGATION;  Surgeon: Jerelyn Charles, MD;  Location: [place] LD ORS;  Service: Obstetrics;  Laterality: Bilateral;   WISDOM TOOTH EXTRACTION        Current Outpatient Medications  Medication Sig Dispense Refill   albuterol (VENTOLIN HFA) 108 (90 Base) MCG/ACT inhaler Inhale 2 puffs into the lungs every 6 (six) hours as needed for wheezing or shortness of breath. 18 g 1   AMBULATORY NON FORMULARY MEDICATION Medication Name: Diltiazem 2% mixed with Lidocaine 5% Sig: apply a pea size amount inside rectum three times a day 30 g 1   AMBULATORY NON FORMULARY MEDICATION Medication Name: Nitroglycerin 0.125% gel apply a pea size amount to your rectum three times daily x 6-8 weeks. 45 g 3   ibuprofen (ADVIL) 800 MG tablet Take 1 tablet by mouth every 6 - 8 hours as needed for pain 18 tablet 0   Na Sulfate-K Sulfate-Mg Sulf 17.5-3.13-1.6 GM/177ML SOLN Take 1 kit by mouth once for 1 dose. 354 mL 0   phentermine 37.5 MG capsule Take 1 capsule by mouth every morning. 30 capsule 0   polyethylene glycol powder (GLYCOLAX/MIRALAX) 17 GM/SCOOP powder Mix 17 g of powder in 4 oz of water or juice & drink by mouth daily. 289 g 1   traMADol (ULTRAM) 50 MG tablet Take 1 tablet by mouth every 6 hours as needed for pain 10 tablet 0   medroxyPROGESTERone (PROVERA) 10 MG tablet Take 1 tablet by mouth daily for 10 days to induce menses (Patient not taking: Reported  on 11/30/2021) 30 tablet 3   prednisoLONE acetate (PRED FORTE) 1 % ophthalmic suspension Instill 1 drop into each eye 4 times daily as directed (Patient not taking: Reported on 11/30/2021) 5 mL 4   No current facility-administered medications for this visit.    Allergies as of 11/30/2021 - Review Complete 11/30/2021  Allergen Reaction Noted   Bee pollen Itching 06/16/2016   Shellfish allergy Anaphylaxis, Swelling, and Other (See Comments) 03/24/2012    Family History  Adopted: Yes  Problem Relation Age of Onset   Drug abuse Mother     Hepatitis Mother    Other Father        hx unknown     Physical Exam: General:   Alert,  well-nourished, pleasant and cooperative in NAD Head:  Normocephalic and atraumatic. Eyes:  Sclera clear, no icterus.   Conjunctiva pink. Abdomen:  Soft, nontender, nondistended, normal bowel sounds, no rebound or guarding. No hepatosplenomegaly.   Rectal:   No chemical dermatitis. Small posterior fissure that is well healed. But, she has pain in the anterior canal that is extremely sensitive.  Anterior sentinel tag. Internal hemorrhoids without prolapse. Normal anocutaneous reflex. No stool in the rectal vault. No mass or fecal impaction. Normal anal resting tone.  Chaperone: Heather Neurologic:  Alert and  oriented x4;  grossly nonfocal Skin:  Intact without significant lesions or rashes. Psych:  Alert and cooperative. Normal mood and affect.    Unique Sillas L. Tarri Glenn, MD, MPH 11/30/2021, 3:26 PM

## 2021-11-30 NOTE — Patient Instructions (Signed)
Your provider has requested that you go to the basement level for lab work before leaving today. Press "B" on the elevator. The lab is located at the first door on the left as you exit the elevator.  We have sent a prescription for nitroglycerin 0.125% gel to [facility]. You should apply a pea size amount to your rectum three times daily x 6-8 weeks.  [facility]'s information is below: Address: [address], [place], [place] [address]  [phone]  *Please DO NOT go directly from our office to pick up this medication! Give the pharmacy 1 day to process the prescription as this is compounded and takes time to make.  You have been scheduled for a colonoscopy. Please follow written instructions given to you at your visit today.  Please pick up your prep supplies at the pharmacy within the next 1-3 days. If you use inhalers (even only as needed), please bring them with you on the day of your procedure.  _______________________________________________________  If you are age 14 or older, your body mass index should be between 23-30. Your Body mass index is 44.21 kg/m. If this is out of the aforementioned range listed, please consider follow up with your Primary Care Provider.  If you are age 40 or younger, your body mass index should be between 19-25. Your Body mass index is 44.21 kg/m. If this is out of the aformentioned range listed, please consider follow up with your Primary Care Provider.   ________________________________________________________  The [place] [place] [facility] providers would like to encourage you to use [facility] to communicate with providers for non-urgent requests or questions.  Due to long hold times on the telephone, sending your provider a message by [facility] may be a faster and more efficient way to get a response.  Please allow 48 business hours for a response.  Please remember that this is for non-urgent requests.   _______________________________________________________

## 2021-12-01 ENCOUNTER — Other Ambulatory Visit (HOSPITAL_COMMUNITY): Payer: Self-pay

## 2021-12-02 ENCOUNTER — Ambulatory Visit: Payer: No Typology Code available for payment source | Admitting: Gastroenterology

## 2021-12-02 ENCOUNTER — Encounter: Payer: Self-pay | Admitting: Gastroenterology

## 2021-12-02 VITALS — BP 108/64 | HR 84 | Temp 98.2°F | Ht 61.0 in | Wt 234.0 lb

## 2021-12-02 MED ORDER — SODIUM CHLORIDE 0.9 % IV SOLN: 500.0000 mL | INTRAVENOUS | Status: DC

## 2021-12-02 NOTE — Progress Notes (Signed)
Indication for colonoscopy: Rectal pain not improving despite treatment for anal fissure  Please see my 11/30/2021 office note for complete details.  Unfortunately, it has not been 10 days since last dose of phentermine. Will need to reschedule her colonoscopy.

## 2021-12-02 NOTE — Progress Notes (Signed)
Patient took Phenterimine yesterday.  Dr. Tarri Glenn informed and procedure cancelled.  Courtney asked to reschedule procedure holding Phenterimine for 10 days. SChaplin, RN,BSN

## 2021-12-08 ENCOUNTER — Ambulatory Visit: Payer: No Typology Code available for payment source | Admitting: Family Medicine

## 2021-12-14 ENCOUNTER — Ambulatory Visit: Payer: No Typology Code available for payment source | Admitting: Family Medicine

## 2021-12-15 ENCOUNTER — Encounter: Payer: No Typology Code available for payment source | Admitting: Gastroenterology

## 2021-12-22 ENCOUNTER — Encounter: Payer: Self-pay | Admitting: Certified Registered Nurse Anesthetist

## 2021-12-27 ENCOUNTER — Telehealth: Payer: Self-pay | Admitting: Gastroenterology

## 2021-12-27 ENCOUNTER — Other Ambulatory Visit (HOSPITAL_COMMUNITY): Payer: Self-pay

## 2021-12-27 ENCOUNTER — Ambulatory Visit: Payer: No Typology Code available for payment source | Admitting: Family Medicine

## 2021-12-27 MED ORDER — NA SULFATE-K SULFATE-MG SULF 17.5-3.13-1.6 GM/177ML PO SOLN
1.0000 | Freq: Once | ORAL | 0 refills | Status: DC
  Filled 2021-12-27: qty 354, 1d supply, fill #0

## 2021-12-27 NOTE — Telephone Encounter (Signed)
Patient called has procedure scheduled for tomorrow and she has no prep please send to [facility] pharmacy.

## 2021-12-27 NOTE — Telephone Encounter (Signed)
Patient will pick up sample of Plenvu for procedure tomorrow.

## 2021-12-28 ENCOUNTER — Ambulatory Visit (AMBULATORY_SURGERY_CENTER): Payer: No Typology Code available for payment source | Admitting: Gastroenterology

## 2021-12-28 ENCOUNTER — Encounter: Payer: Self-pay | Admitting: Gastroenterology

## 2021-12-28 ENCOUNTER — Other Ambulatory Visit (INDEPENDENT_AMBULATORY_CARE_PROVIDER_SITE_OTHER): Payer: No Typology Code available for payment source

## 2021-12-28 ENCOUNTER — Telehealth: Payer: Self-pay

## 2021-12-28 VITALS — BP 107/64 | HR 75 | Temp 97.8°F | Resp 16 | Ht 61.0 in | Wt 234.0 lb

## 2021-12-28 DIAGNOSIS — K6289 Other specified diseases of anus and rectum: Secondary | ICD-10-CM

## 2021-12-28 DIAGNOSIS — D649 Anemia, unspecified: Secondary | ICD-10-CM | POA: Diagnosis not present

## 2021-12-28 DIAGNOSIS — K602 Anal fissure, unspecified: Secondary | ICD-10-CM

## 2021-12-28 LAB — COMPREHENSIVE METABOLIC PANEL
ALT: 16 U/L (ref 0–35)
AST: 19 U/L (ref 0–37)
Albumin: 4.3 g/dL (ref 3.5–5.2)
Alkaline Phosphatase: 85 U/L (ref 39–117)
BUN: 7 mg/dL (ref 6–23)
CO2: 22 mEq/L (ref 19–32)
Calcium: 9.5 mg/dL (ref 8.4–10.5)
Chloride: 110 mEq/L (ref 96–112)
Creatinine, Ser: 0.68 mg/dL (ref 0.40–1.20)
GFR: 117.27 mL/min (ref >60.00)
Glucose, Bld: 83 mg/dL (ref 70–99)
Potassium: 3.7 mEq/L (ref 3.5–5.1)
Sodium: 143 mEq/L (ref 135–145)
Total Bilirubin: 0.5 mg/dL (ref 0.2–1.2)
Total Protein: 8 g/dL (ref 6.0–8.3)

## 2021-12-28 LAB — CBC
HCT: 34.8 % — ABNORMAL LOW (ref 36.0–46.0)
Hemoglobin: 10.9 g/dL — ABNORMAL LOW (ref 12.0–15.0)
MCHC: 31.3 g/dL (ref 30.0–36.0)
MCV: 74.5 fl — ABNORMAL LOW (ref 78.0–100.0)
Platelets: 333 10*3/uL (ref 150.0–400.0)
RBC: 4.68 Mil/uL (ref 3.87–5.11)
RDW: 16.2 % — ABNORMAL HIGH (ref 11.5–15.5)
WBC: 8.2 10*3/uL (ref 4.0–10.5)

## 2021-12-28 LAB — C-REACTIVE PROTEIN: CRP: 2.2 mg/dL (ref 0.5–20.0)

## 2021-12-28 LAB — IBC + FERRITIN
Ferritin: 28.9 ng/mL (ref 10.0–291.0)
Iron: 29 ug/dL — ABNORMAL LOW (ref 42–145)
Saturation Ratios: 7 % — ABNORMAL LOW (ref 20.0–50.0)
TIBC: 413 ug/dL (ref 250.0–450.0)
Transferrin: 295 mg/dL (ref 212.0–360.0)

## 2021-12-28 MED ORDER — SODIUM CHLORIDE 0.9 % IV SOLN: 500.0000 mL | Freq: Once | INTRAVENOUS | Status: DC

## 2021-12-28 NOTE — Telephone Encounter (Signed)
Referral faxed to [facility] 

## 2021-12-28 NOTE — Telephone Encounter (Signed)
-----   Message from Tressia Danas, MD sent at 12/28/2021  2:55 PM EST ----- Please arrange referral to Dr. Marin Olp for anal fissure.  Thank you.  KLB

## 2021-12-28 NOTE — Patient Instructions (Signed)
Continue to use topical nitroglycerin.  High fiber diet is recommended; use a daily bulking agent like Metamucil or Benefiber.  Sitz baths may provide some relief.    YOU HAD AN ENDOSCOPIC PROCEDURE TODAY AT THE [facility]:   Refer to the procedure report that was given to you for any specific questions about what was found during the examination.  If the procedure report does not answer your questions, please call your gastroenterologist to clarify.  If you requested that your care partner not be given the details of your procedure findings, then the procedure report has been included in a sealed envelope for you to review at your convenience later.  YOU SHOULD EXPECT: Some feelings of bloating in the abdomen. Passage of more gas than usual.  Walking can help get rid of the air that was put into your GI tract during the procedure and reduce the bloating. If you had a lower endoscopy (such as a colonoscopy or flexible sigmoidoscopy) you may notice spotting of blood in your stool or on the toilet paper. If you underwent a bowel prep for your procedure, you may not have a normal bowel movement for a few days.  Please Note:  You might notice some irritation and congestion in your nose or some drainage.  This is from the oxygen used during your procedure.  There is no need for concern and it should clear up in a day or so.  SYMPTOMS TO REPORT IMMEDIATELY:  Following lower endoscopy (colonoscopy or flexible sigmoidoscopy):  Excessive amounts of blood in the stool  Significant tenderness or worsening of abdominal pains  Swelling of the abdomen that is new, acute  Fever of 100F or higher   For urgent or emergent issues, a gastroenterologist can be reached at any hour by calling [phone]. Do not use MyChart messaging for urgent concerns.    DIET:  We do recommend a small meal at first, but then you may proceed to your regular diet.  Drink plenty of fluids but you should avoid  alcoholic beverages for 24 hours.  ACTIVITY:  You should plan to take it easy for the rest of today and you should NOT DRIVE or use heavy machinery until tomorrow (because of the sedation medicines used during the test).    FOLLOW UP: Our staff will call the number listed on your records the next business day following your procedure.  We will call around 7:15- 8:00 am to check on you and address any questions or concerns that you may have regarding the information given to you following your procedure. If we do not reach you, we will leave a message.     If any biopsies were taken you will be contacted by phone or by letter within the next 1-3 weeks.  Please call us at [phone] if you have not heard about the biopsies in 3 weeks.    SIGNATURES/CONFIDENTIALITY: You and/or your care partner have signed paperwork which will be entered into your electronic medical record.  These signatures attest to the fact that that the information above on your After Visit Summary has been reviewed and is understood.  Full responsibility of the confidentiality of this discharge information lies with you and/or your care-partner.

## 2021-12-28 NOTE — Op Note (Addendum)
[facility] Patient Name: Lindsay Perez Procedure Date: 12/28/2021 2:32 PM MRN: [id number] Endoscopist: Tressia Danas MD, MD, [id number] Age: 31 Referring MD:  Date of Birth: [birth year] Gender: Female Account #: [id number] Procedure:                Colonoscopy Indications:              Rectal bleeding, Rectal pain not responding to                            topical Diltiazem or Nitroglycerin Medicines:                Monitored Anesthesia Care Procedure:                Pre-Anesthesia Assessment:                           - Prior to the procedure, a History and Physical                            was performed, and patient medications and                            allergies were reviewed. The patient's tolerance of                            previous anesthesia was also reviewed. The risks                            and benefits of the procedure and the sedation                            options and risks were discussed with the patient.                            All questions were answered, and informed consent                            was obtained. Prior Anticoagulants: The patient has                            taken no anticoagulant or antiplatelet agents. ASA                            Grade Assessment: III - A patient with severe                            systemic disease. After reviewing the risks and                            benefits, the patient was deemed in satisfactory                            condition to undergo the procedure.  After obtaining informed consent, the colonoscope                            was passed under direct vision. Throughout the                            procedure, the patient's blood pressure, pulse, and                            oxygen saturations were monitored continuously. The                            CF [id number] #[id number] was introduced through the anus                            and advanced  to the 3 cm into the ileum. A second                            forward view of the right colon was performed. The                            colonoscopy was performed without difficulty. The                            patient tolerated the procedure well. The quality                            of the bowel preparation was good. The terminal                            ileum, ileocecal valve, appendiceal orifice, and                            rectum were photographed. Scope In: 2:38:26 PM Scope Out: 2:48:04 PM Scope Withdrawal Time: 0 hours 6 minutes 23 seconds  Total Procedure Duration: 0 hours 9 minutes 38 seconds  Findings:                 A posterior and anterior anal fissure was found on                            perianal exam.                           The colon (entire examined portion) appeared normal.                           The terminal ileum appeared normal.                           The exam was otherwise without abnormality on                            direct and retroflexion views. Complications:  No immediate complications. Estimated Blood Loss:     Estimated blood loss: none. Impression:               - Anal fissures found on perianal exam. Not                            responding to Diltiazem or Nitroglycerin.                           - The entire examined colon and terminal ileum are                            otherwise normal.                           - No specimens collected. Recommendation:           - Patient has a contact number available for                            emergencies. The signs and symptoms of potential                            delayed complications were discussed with the                            patient. Return to normal activities tomorrow.                            Written discharge instructions were provided to the                            patient.                           - High fiber diet. Use a daily stool bulking  agent                            such as Metamucil or Benefiber.                           - Continue present medications.                           - Continue topical nitroglycerin.                           - Sitz baths may provide some additional relief.                           - Referral to surgery.                           - Repeat colonoscopy at age 23 for screening                            purposes. Cala Bradford  Boomer Winders MD, MD 12/28/2021 2:54:56 PM This report has been signed electronically.

## 2021-12-28 NOTE — Progress Notes (Signed)
Indication for colonoscopy: Rectal pain and bleeding not improving despite treatment for anal fissure  Please see my 11/30/21 office not for complete details. There has been no change in history or physical exam. She remains an appropriate candidate for monitored anesthesia care in the [facility].

## 2021-12-28 NOTE — Progress Notes (Signed)
Report given to PACU, vss 

## 2021-12-29 ENCOUNTER — Telehealth: Payer: Self-pay

## 2021-12-29 ENCOUNTER — Other Ambulatory Visit (HOSPITAL_COMMUNITY): Payer: Self-pay

## 2021-12-29 NOTE — Telephone Encounter (Signed)
  Follow up Call-     12/28/2021    2:01 PM 12/02/2021   10:31 AM  Call back number  Post procedure Call Back phone  # [phone]  Permission to leave phone message Yes Yes     Patient questions:  Do you have a fever, pain , or abdominal swelling? No. Pain Score  0 *  Have you tolerated food without any problems? Yes.    Have you been able to return to your normal activities? Yes.    Do you have any questions about your discharge instructions: Diet   No. Medications  No. Follow up visit  No.  Do you have questions or concerns about your Care? No.  Actions: * If pain score is 4 or above: No action needed, pain <4.

## 2022-01-04 ENCOUNTER — Other Ambulatory Visit: Payer: Self-pay

## 2022-01-04 DIAGNOSIS — D649 Anemia, unspecified: Secondary | ICD-10-CM

## 2022-01-12 ENCOUNTER — Encounter: Payer: Self-pay | Admitting: Family Medicine

## 2022-01-12 ENCOUNTER — Ambulatory Visit (INDEPENDENT_AMBULATORY_CARE_PROVIDER_SITE_OTHER): Payer: No Typology Code available for payment source | Admitting: Family Medicine

## 2022-01-12 ENCOUNTER — Other Ambulatory Visit (HOSPITAL_COMMUNITY): Payer: Self-pay

## 2022-01-12 VITALS — BP 121/80 | HR 87 | Temp 98.1°F | Resp 16 | Wt 235.0 lb

## 2022-01-12 DIAGNOSIS — Z6841 Body Mass Index (BMI) 40.0 and over, adult: Secondary | ICD-10-CM

## 2022-01-12 DIAGNOSIS — Z7689 Persons encountering health services in other specified circumstances: Secondary | ICD-10-CM | POA: Diagnosis not present

## 2022-01-12 MED ORDER — PHENTERMINE HCL 37.5 MG PO CAPS
37.5000 mg | ORAL_CAPSULE | ORAL | 0 refills | Status: DC
  Filled 2022-01-12: qty 30, 30d supply, fill #0

## 2022-01-12 NOTE — Progress Notes (Unsigned)
Weight check for patient. Patient is currently on phentermine. No other concerns today

## 2022-01-13 ENCOUNTER — Encounter: Payer: Self-pay | Admitting: Family Medicine

## 2022-01-13 NOTE — Progress Notes (Signed)
Established Patient Office Visit  Subjective    Patient ID: Lindsay Perez, female    DOB: 12/17/1990  Age: 31 y.o. MRN: [id number]  CC:  Chief Complaint  Patient presents with   Weight Check    HPI Lindsay Perez presents for routine weight management. Patient reports that Lindsay Perez was off of meds 2/2 GI evaluation.    Outpatient Encounter Medications as of 01/12/2022  Medication Sig   albuterol (VENTOLIN HFA) 108 (90 Base) MCG/ACT inhaler Inhale 2 puffs into the lungs every 6 (six) hours as needed for wheezing or shortness of breath.   AMBULATORY NON FORMULARY MEDICATION Medication Name: Diltiazem 2% mixed with Lidocaine 5% Sig: apply a pea size amount inside rectum three times a day (Patient not taking: Reported on 12/02/2021)   AMBULATORY NON FORMULARY MEDICATION Medication Name: Nitroglycerin 0.125% gel apply a pea size amount to your rectum three times daily x 6-8 weeks. (Patient not taking: Reported on 12/28/2021)   ibuprofen (ADVIL) 800 MG tablet Take 1 tablet by mouth every 6 - 8 hours as needed for pain   phentermine 37.5 MG capsule Take 1 capsule by mouth every morning.   polyethylene glycol powder (GLYCOLAX/MIRALAX) 17 GM/SCOOP powder Mix 17 g of powder in 4 oz of water or juice & drink by mouth daily.   prednisoLONE acetate (PRED FORTE) 1 % ophthalmic suspension Instill 1 drop into each eye 4 times daily as directed (Patient not taking: Reported on 11/30/2021)   traMADol (ULTRAM) 50 MG tablet Take 1 tablet by mouth every 6 hours as needed for pain (Patient not taking: Reported on 12/02/2021)   [DISCONTINUED] phentermine 37.5 MG capsule Take 1 capsule by mouth every morning.   No facility-administered encounter medications on file as of 01/12/2022.    Past Medical History:  Diagnosis Date   Anemia    as a child   Asthma    as a child   Chlamydia    GERD (gastroesophageal reflux disease)    Headache(784.0)    migranes   Seasonal allergies    Trichomonas  infection     Past Surgical History:  Procedure Laterality Date   CESAREAN SECTION N/A 10/26/2017   Procedure: CESAREAN SECTION;  Surgeon: Jerelyn Charles, MD;  Location: [place];  Service: Obstetrics;  Laterality: N/A;   CESAREAN SECTION WITH BILATERAL TUBAL LIGATION Bilateral 04/28/2020   Procedure: REPEAT CESAREAN SECTION WITH BILATERAL TUBAL LIGATION;  Surgeon: Jerelyn Charles, MD;  Location: [facility];  Service: Obstetrics;  Laterality: Bilateral;   WISDOM TOOTH EXTRACTION      Family History  Adopted: Yes  Problem Relation Age of Onset   Drug abuse Mother    Hepatitis Mother    Other Father        hx unknown    Social History   Socioeconomic History   Marital status: Single    Spouse name: Not on file   Number of children: 2   Years of education: some colle   Highest education level: Not on file  Occupational History    Employer: [employer]    Comment: cancer center at [place] Use   Smoking status: Never   Smokeless tobacco: Never  Vaping Use   Vaping Use: Never used  Substance and Sexual Activity   Alcohol use: Not Currently    Comment: social   Drug use: Not Currently    Frequency: 7.0 times per week    Types: Marijuana    Comment: last use in  Jan 2019   Sexual activity: Yes  Other Topics Concern   Not on file  Social History Narrative   Lives with nephew (22).    Some college, starting back in January.   [name], age 53.        Social Determinants of Health   Financial Resource Strain: Not on file  Food Insecurity: Not on file  Transportation Needs: Not on file  Physical Activity: Not on file  Stress: Not on file  Social Connections: Not on file  Intimate Partner Violence: Not on file    Review of Systems  All other systems reviewed and are negative.       Objective    BP 121/80   Pulse 87   Temp 98.1 F (36.7 C) (Oral)   Resp 16   Wt 235 lb (106.6 kg)   LMP 12/26/2021 Comment: tubal  BMI 44.40  kg/m   Physical Exam Vitals and nursing note reviewed.  Constitutional:      General: Lindsay Perez is not in acute distress.    Appearance: Lindsay Perez is obese.  Cardiovascular:     Rate and Rhythm: Normal rate and regular rhythm.  Pulmonary:     Effort: Pulmonary effort is normal.     Breath sounds: Normal breath sounds.  Neurological:     General: No focal deficit present.     Mental Status: Lindsay Perez is alert and oriented to person, place, and time.         Assessment & Plan:   1. Encounter for weight management Will refill phentermine and monitor  2. Class 3 severe obesity due to excess calories without serious comorbidity with body mass index (BMI) of 40.0 to 44.9 in adult [facility])     Return in about 4 weeks (around 02/09/2022) for follow up.   [name], MD

## 2022-01-14 DIAGNOSIS — K600 Acute anal fissure: Secondary | ICD-10-CM | POA: Diagnosis not present

## 2022-01-19 ENCOUNTER — Ambulatory Visit: Payer: No Typology Code available for payment source | Admitting: Family Medicine

## 2022-01-21 ENCOUNTER — Other Ambulatory Visit (HOSPITAL_COMMUNITY): Payer: Self-pay

## 2022-02-16 ENCOUNTER — Ambulatory Visit: Payer: Medicaid Other | Admitting: Family Medicine

## 2022-03-14 ENCOUNTER — Encounter: Payer: Self-pay | Admitting: Family Medicine

## 2022-03-14 ENCOUNTER — Other Ambulatory Visit (HOSPITAL_COMMUNITY): Payer: Self-pay

## 2022-03-14 ENCOUNTER — Ambulatory Visit (INDEPENDENT_AMBULATORY_CARE_PROVIDER_SITE_OTHER): Payer: 59 | Admitting: Family Medicine

## 2022-03-14 VITALS — BP 120/80 | HR 96 | Temp 98.1°F | Resp 16 | Wt 232.0 lb

## 2022-03-14 DIAGNOSIS — R110 Nausea: Secondary | ICD-10-CM | POA: Diagnosis not present

## 2022-03-14 DIAGNOSIS — Z7689 Persons encountering health services in other specified circumstances: Secondary | ICD-10-CM | POA: Diagnosis not present

## 2022-03-14 DIAGNOSIS — R519 Headache, unspecified: Secondary | ICD-10-CM | POA: Diagnosis not present

## 2022-03-14 DIAGNOSIS — Z6841 Body Mass Index (BMI) 40.0 and over, adult: Secondary | ICD-10-CM | POA: Diagnosis not present

## 2022-03-14 MED ORDER — PHENTERMINE HCL 37.5 MG PO CAPS
37.5000 mg | ORAL_CAPSULE | ORAL | 0 refills | Status: DC
  Filled 2022-03-14: qty 30, 30d supply, fill #0

## 2022-03-14 MED ORDER — PROMETHAZINE HCL 12.5 MG PO TABS
12.5000 mg | ORAL_TABLET | Freq: Three times a day (TID) | ORAL | 0 refills | Status: AC | PRN
  Filled 2022-03-14: qty 30, 10d supply, fill #0

## 2022-03-14 NOTE — Progress Notes (Unsigned)
Patient would like to talk about nausea with headache for 2wks. Patient has taken OTC medication with no relief.

## 2022-03-15 NOTE — Progress Notes (Unsigned)
Established Patient Office Visit  Subjective    Patient ID: Lindsay Perez, female    DOB: Aug 04, 1990  Age: 32 y.o. MRN: [id number]  CC:  Chief Complaint  Patient presents with   Weight Check    HPI [name] presents for routine weight management. She also reports that she has been having intermittent headaches. The headaches have +/- aura. She denies known trauma or injury. She denies fever/chills or viral sx. She has also had intermittent nausea with vomiting that seems to coincide with headaches.    Outpatient Encounter Medications as of 03/14/2022  Medication Sig   promethazine (PHENERGAN) 12.5 MG tablet Take 1 tablet (12.5 mg total) by mouth every 8 (eight) hours as needed for nausea or vomiting.   albuterol (VENTOLIN HFA) 108 (90 Base) MCG/ACT inhaler Inhale 2 puffs into the lungs every 6 (six) hours as needed for wheezing or shortness of breath.   AMBULATORY NON FORMULARY MEDICATION Medication Name: Diltiazem 2% mixed with Lidocaine 5% Sig: apply a pea size amount inside rectum three times a day (Patient not taking: Reported on 12/02/2021)   AMBULATORY NON FORMULARY MEDICATION Medication Name: Nitroglycerin 0.125% gel apply a pea size amount to your rectum three times daily x 6-8 weeks. (Patient not taking: Reported on 12/28/2021)   ibuprofen (ADVIL) 800 MG tablet Take 1 tablet by mouth every 6 - 8 hours as needed for pain   phentermine 37.5 MG capsule Take 1 capsule by mouth every morning.   polyethylene glycol powder (GLYCOLAX/MIRALAX) 17 GM/SCOOP powder Mix 17 g of powder in 4 oz of water or juice & drink by mouth daily.   prednisoLONE acetate (PRED FORTE) 1 % ophthalmic suspension Instill 1 drop into each eye 4 times daily as directed (Patient not taking: Reported on 11/30/2021)   traMADol (ULTRAM) 50 MG tablet Take 1 tablet by mouth every 6 hours as needed for pain (Patient not taking: Reported on 12/02/2021)   [DISCONTINUED] phentermine 37.5 MG capsule Take 1  capsule by mouth every morning.   No facility-administered encounter medications on file as of 03/14/2022.    Past Medical History:  Diagnosis Date   Anemia    as a child   Asthma    as a child   Chlamydia    GERD (gastroesophageal reflux disease)    Headache(784.0)    migranes   Seasonal allergies    Trichomonas infection     Past Surgical History:  Procedure Laterality Date   CESAREAN SECTION N/A 10/26/2017   Procedure: CESAREAN SECTION;  Surgeon: Jerelyn Charles, MD;  Location: [place];  Service: Obstetrics;  Laterality: N/A;   CESAREAN SECTION WITH BILATERAL TUBAL LIGATION Bilateral 04/28/2020   Procedure: REPEAT CESAREAN SECTION WITH BILATERAL TUBAL LIGATION;  Surgeon: Jerelyn Charles, MD;  Location: [facility];  Service: Obstetrics;  Laterality: Bilateral;   WISDOM TOOTH EXTRACTION      Family History  Adopted: Yes  Problem Relation Age of Onset   Drug abuse Mother    Hepatitis Mother    Other Father        hx unknown    Social History   Socioeconomic History   Marital status: Single    Spouse name: Not on file   Number of children: 2   Years of education: some colle   Highest education level: Not on file  Occupational History    Employer: [employer]    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    Some college, starting back in January.   [name], age 63.        Social Determinants of Health   Financial Resource Strain: Not on file  Food Insecurity: Not on file  Transportation Needs: Not on file  Physical Activity: Not on file   Stress: Not on file  Social Connections: Not on file  Intimate Partner Violence: Not on file    Review of Systems  Constitutional:  Negative for chills and fever.  Gastrointestinal:  Positive for nausea and vomiting.  Neurological:  Positive for headaches.  All other systems reviewed and are negative.       Objective    BP 120/80   Pulse 96   Temp 98.1 F (36.7 C) (Oral)   Resp 16   Wt 232 lb (105.2 kg)   BMI 43.84 kg/m   Physical Exam Vitals and nursing note reviewed.  Constitutional:      General: She is not in acute distress.    Appearance: She is obese.  HENT:     Head: Normocephalic and atraumatic.     Right Ear: Tympanic membrane normal.     Left Ear: Tympanic membrane normal.     Nose: Congestion present.  Cardiovascular:     Rate and Rhythm: Normal rate and regular rhythm.  Pulmonary:     Effort: Pulmonary effort is normal.     Breath sounds: Normal breath sounds.  Abdominal:     Palpations: Abdomen is soft.     Tenderness: There is no abdominal tenderness.  Neurological:     General: No focal deficit present.     Mental Status: She is alert and oriented to person, place, and time.         Assessment & Plan:   1. Nonintractable headache, unspecified chronicity pattern, unspecified headache type Sx diary recommended. Promethazine prescribed.   2. Nausea without vomiting Adequate fluids recommended. As above  3. Encounter for weight management Continues to do well. Phentermine refilled.   4. Class 3 severe obesity due to excess calories without serious comorbidity with body mass index (BMI) of 40.0 to 44.9 in adult [facility])     Return in about 4 weeks (around 04/11/2022) for follow up.   Becky Sax, MD

## 2022-03-16 ENCOUNTER — Encounter: Payer: Self-pay | Admitting: Family Medicine

## 2022-03-21 DIAGNOSIS — K601 Chronic anal fissure: Secondary | ICD-10-CM | POA: Diagnosis not present

## 2022-03-22 ENCOUNTER — Encounter (HOSPITAL_BASED_OUTPATIENT_CLINIC_OR_DEPARTMENT_OTHER): Payer: Self-pay | Admitting: Surgery

## 2022-03-22 NOTE — Progress Notes (Signed)
Spoke w/ via phone for pre-op interview--- Audreyanna Lab needs dos----NONE               Lab results------ COVID test -----patient states asymptomatic no test needed Arrive at -------0600 NPO after MN NO Solid Food.  Med rec completed Medications to take morning of surgery -----NONE Diabetic medication ----- Patient instructed no nail polish to be worn day of surgery Patient instructed to bring photo id and insurance card day of surgery Patient aware to have Driver (ride ) / caregiver  Duffy Rhody  for 24 hours after surgery  Patient Special Instructions ----- Pre-Op special Istructions ----- Hold Phentermine, last dose 03/22/22. Patient not on surgery schedule until 03/21/22 office did not let her know about holding 7 days prior. Patient verbalized understanding of instructions that were given at this phone interview. Patient denies shortness of breath, chest pain, fever, cough at this phone interview.

## 2022-03-24 ENCOUNTER — Ambulatory Visit: Payer: Self-pay | Admitting: Surgery

## 2022-03-24 MED ORDER — BUPIVACAINE LIPOSOME 1.3 % IJ SUSP: 20.0000 mL | Freq: Once | INTRAMUSCULAR | Status: AC

## 2022-03-24 NOTE — Anesthesia Preprocedure Evaluation (Addendum)
Anesthesia Evaluation  Patient identified by MRN, date of birth, ID band Patient awake    Reviewed: Allergy & Precautions, H&P , NPO status , Patient's Chart, lab work & pertinent test results  Airway Mallampati: II  TM Distance: >3 FB Neck ROM: Full    Dental no notable dental hx. (+) Teeth Intact, Dental Advisory Given   Pulmonary neg pulmonary ROS, asthma    Pulmonary exam normal breath sounds clear to auscultation       Cardiovascular Exercise Tolerance: Good negative cardio ROS Normal cardiovascular exam Rhythm:Regular Rate:Normal     Neuro/Psych  Headaches negative neurological ROS  negative psych ROS   GI/Hepatic negative GI ROS, Neg liver ROS,GERD  ,,  Endo/Other  negative endocrine ROS    Renal/GU negative Renal ROS  negative genitourinary   Musculoskeletal negative musculoskeletal ROS (+)    Abdominal  (+) + obese  Peds negative pediatric ROS (+)  Hematology negative hematology ROS (+)   Anesthesia Other Findings   Reproductive/Obstetrics negative OB ROS                             Anesthesia Physical Anesthesia Plan  ASA: 3  Anesthesia Plan: MAC   Post-op Pain Management: Precedex and Tylenol PO (pre-op)*   Induction: Intravenous  PONV Risk Score and Plan: 2 and Treatment may vary due to age or medical condition, Midazolam, Ondansetron, TIVA and Propofol infusion  Airway Management Planned: Nasal Cannula and Natural Airway  Additional Equipment: None  Intra-op Plan:   Post-operative Plan:   Informed Consent: I have reviewed the patients History and Physical, chart, labs and discussed the procedure including the risks, benefits and alternatives for the proposed anesthesia with the patient or authorized representative who has indicated his/her understanding and acceptance.     Dental advisory given  Plan Discussed with: CRNA, Anesthesiologist and  Surgeon  Anesthesia Plan Comments:        Anesthesia Quick Evaluation

## 2022-03-25 ENCOUNTER — Encounter (HOSPITAL_BASED_OUTPATIENT_CLINIC_OR_DEPARTMENT_OTHER): Payer: Self-pay | Admitting: Surgery

## 2022-03-25 ENCOUNTER — Encounter (HOSPITAL_BASED_OUTPATIENT_CLINIC_OR_DEPARTMENT_OTHER)
Admission: RE | Disposition: A | Discharge status: Home / Self Care | Payer: Self-pay | Source: Home / Self Care | Attending: Surgery

## 2022-03-25 ENCOUNTER — Ambulatory Visit (HOSPITAL_BASED_OUTPATIENT_CLINIC_OR_DEPARTMENT_OTHER): Payer: 59 | Admitting: Anesthesiology

## 2022-03-25 ENCOUNTER — Other Ambulatory Visit (HOSPITAL_COMMUNITY): Payer: Self-pay

## 2022-03-25 ENCOUNTER — Ambulatory Visit (HOSPITAL_BASED_OUTPATIENT_CLINIC_OR_DEPARTMENT_OTHER)
Admission: RE | Admit: 2022-03-25 | Discharge: 2022-03-25 | Disposition: A | Discharge status: Home / Self Care | Payer: 59 | Attending: Surgery | Admitting: Surgery

## 2022-03-25 ENCOUNTER — Other Ambulatory Visit: Payer: Self-pay

## 2022-03-25 DIAGNOSIS — Z6841 Body Mass Index (BMI) 40.0 and over, adult: Secondary | ICD-10-CM | POA: Diagnosis not present

## 2022-03-25 DIAGNOSIS — Z01818 Encounter for other preprocedural examination: Secondary | ICD-10-CM

## 2022-03-25 DIAGNOSIS — J45909 Unspecified asthma, uncomplicated: Secondary | ICD-10-CM

## 2022-03-25 DIAGNOSIS — E669 Obesity, unspecified: Secondary | ICD-10-CM | POA: Diagnosis not present

## 2022-03-25 DIAGNOSIS — K219 Gastro-esophageal reflux disease without esophagitis: Secondary | ICD-10-CM | POA: Diagnosis not present

## 2022-03-25 DIAGNOSIS — K601 Chronic anal fissure: Secondary | ICD-10-CM | POA: Insufficient documentation

## 2022-03-25 DIAGNOSIS — K644 Residual hemorrhoidal skin tags: Secondary | ICD-10-CM | POA: Diagnosis not present

## 2022-03-25 DIAGNOSIS — R519 Headache, unspecified: Secondary | ICD-10-CM | POA: Diagnosis not present

## 2022-03-25 HISTORY — PX: SPHINCTEROTOMY: SHX5279

## 2022-03-25 HISTORY — PX: EXCISION OF SKIN TAG: SHX6270

## 2022-03-25 HISTORY — PX: RECTAL EXAM UNDER ANESTHESIA: SHX6399

## 2022-03-25 LAB — POCT PREGNANCY, URINE: Preg Test, Ur: NEGATIVE

## 2022-03-25 SURGERY — SPHINCTEROTOMY, ANAL
Anesthesia: General | Site: Rectum

## 2022-03-25 MED ORDER — ONDANSETRON HCL 4 MG/2ML IJ SOLN
INTRAMUSCULAR | Status: DC | PRN
  Administered 2022-03-25: 4 mg via INTRAVENOUS

## 2022-03-25 MED ORDER — KETAMINE HCL 10 MG/ML IJ SOLN
INTRAMUSCULAR | Status: DC | PRN
  Administered 2022-03-25: 50 mg via INTRAVENOUS

## 2022-03-25 MED ORDER — TRAMADOL HCL 50 MG PO TABS
50.0000 mg | ORAL_TABLET | Freq: Four times a day (QID) | ORAL | 0 refills | Status: AC | PRN
  Filled 2022-03-25: qty 5, 2d supply, fill #0

## 2022-03-25 MED ORDER — FENTANYL CITRATE (PF) 100 MCG/2ML IJ SOLN
INTRAMUSCULAR | Status: DC | PRN
  Administered 2022-03-25 (×2): 50 ug via INTRAVENOUS

## 2022-03-25 MED ORDER — CEFAZOLIN SODIUM-DEXTROSE 2-4 GM/100ML-% IV SOLN
INTRAVENOUS | Status: AC
  Filled 2022-03-25: qty 100

## 2022-03-25 MED ORDER — PROPOFOL 500 MG/50ML IV EMUL
INTRAVENOUS | Status: AC
  Filled 2022-03-25: qty 50

## 2022-03-25 MED ORDER — GLYCOPYRROLATE PF 0.2 MG/ML IJ SOSY
PREFILLED_SYRINGE | INTRAMUSCULAR | Status: AC
  Filled 2022-03-25: qty 1

## 2022-03-25 MED ORDER — SODIUM CHLORIDE (PF) 0.9 % IJ SOLN
INTRAMUSCULAR | Status: DC | PRN
  Administered 2022-03-25: 2 mL

## 2022-03-25 MED ORDER — MIDAZOLAM HCL 5 MG/5ML IJ SOLN
INTRAMUSCULAR | Status: DC | PRN
  Administered 2022-03-25: 2 mg via INTRAVENOUS

## 2022-03-25 MED ORDER — FENTANYL CITRATE (PF) 100 MCG/2ML IJ SOLN
INTRAMUSCULAR | Status: AC
  Filled 2022-03-25: qty 2

## 2022-03-25 MED ORDER — 0.9 % SODIUM CHLORIDE (POUR BTL) OPTIME
TOPICAL | Status: DC | PRN
  Administered 2022-03-25: 500 mL

## 2022-03-25 MED ORDER — PROPOFOL 500 MG/50ML IV EMUL
INTRAVENOUS | Status: DC | PRN
  Administered 2022-03-25: 200 ug/kg/min via INTRAVENOUS

## 2022-03-25 MED ORDER — ONDANSETRON HCL 4 MG/2ML IJ SOLN
INTRAMUSCULAR | Status: AC
  Filled 2022-03-25: qty 2

## 2022-03-25 MED ORDER — KETAMINE HCL 50 MG/5ML IJ SOSY
PREFILLED_SYRINGE | INTRAMUSCULAR | Status: AC
  Filled 2022-03-25: qty 5

## 2022-03-25 MED ORDER — GLYCOPYRROLATE PF 0.2 MG/ML IJ SOSY
PREFILLED_SYRINGE | INTRAMUSCULAR | Status: DC | PRN
  Administered 2022-03-25: 2 mg via INTRAVENOUS

## 2022-03-25 MED ORDER — PROPOFOL 10 MG/ML IV BOLUS
INTRAVENOUS | Status: AC
  Filled 2022-03-25: qty 20

## 2022-03-25 MED ORDER — PROPOFOL 10 MG/ML IV BOLUS
INTRAVENOUS | Status: DC | PRN
  Administered 2022-03-25: 10 mg via INTRAVENOUS
  Administered 2022-03-25: 20 mg via INTRAVENOUS

## 2022-03-25 MED ORDER — ONABOTULINUMTOXINA 100 UNITS IJ SOLR
INTRAMUSCULAR | Status: DC | PRN
  Administered 2022-03-25: 100 [IU] via INTRAMUSCULAR

## 2022-03-25 MED ORDER — CEFAZOLIN SODIUM-DEXTROSE 2-4 GM/100ML-% IV SOLN
2.0000 g | INTRAVENOUS | Status: AC
  Administered 2022-03-25: 2 g via INTRAVENOUS

## 2022-03-25 MED ORDER — BUPIVACAINE LIPOSOME 1.3 % IJ SUSP
INTRAMUSCULAR | Status: DC | PRN
  Administered 2022-03-25: 35 mL

## 2022-03-25 MED ORDER — MIDAZOLAM HCL 2 MG/2ML IJ SOLN
INTRAMUSCULAR | Status: AC
  Filled 2022-03-25: qty 2

## 2022-03-25 MED ORDER — LACTATED RINGERS IV SOLN: INTRAVENOUS | Status: DC

## 2022-03-25 SURGICAL SUPPLY — 66 items
APL SKNCLS STERI-STRIP NONHPOA (GAUZE/BANDAGES/DRESSINGS)
BENZOIN TINCTURE PRP APPL 2/3 (GAUZE/BANDAGES/DRESSINGS) ×2 IMPLANT
BLADE EXTENDED COATED 6.5IN (ELECTRODE) IMPLANT
BLADE SURG 10 STRL SS (BLADE) IMPLANT
BLADE SURG 15 STRL LF DISP TIS (BLADE) ×1 IMPLANT
BLADE SURG 15 STRL SS (BLADE) ×1
BRIEF MESH DISP LRG (UNDERPADS AND DIAPERS) ×2 IMPLANT
COVER BACK TABLE 60X90IN (DRAPES) ×1 IMPLANT
COVER MAYO STAND STRL (DRAPES) ×1 IMPLANT
DRAPE HYSTEROSCOPY (MISCELLANEOUS) IMPLANT
DRAPE LAPAROTOMY 100X72 PEDS (DRAPES) ×1 IMPLANT
DRAPE SHEET LG 3/4 BI-LAMINATE (DRAPES) IMPLANT
DRAPE UTILITY XL STRL (DRAPES) ×1 IMPLANT
GAUZE 4X4 16PLY ~~LOC~~+RFID DBL (SPONGE) ×1 IMPLANT
GAUZE PAD ABD 8X10 STRL (GAUZE/BANDAGES/DRESSINGS) ×1 IMPLANT
GAUZE SPONGE 4X4 12PLY STRL (GAUZE/BANDAGES/DRESSINGS) ×1 IMPLANT
GLOVE BIO SURGEON STRL SZ7.5 (GLOVE) ×1 IMPLANT
GLOVE INDICATOR 8.0 STRL GRN (GLOVE) ×1 IMPLANT
GOWN STRL REUS W/TWL XL LVL3 (GOWN DISPOSABLE) ×1 IMPLANT
HYDROGEN PEROXIDE 16OZ (MISCELLANEOUS) ×1 IMPLANT
IV CATH 14GX2 1/4 (CATHETERS) IMPLANT
IV CATH 18G SAFETY (IV SOLUTION) IMPLANT
KIT SIGMOIDOSCOPE (SET/KITS/TRAYS/PACK) IMPLANT
KIT TURNOVER CYSTO (KITS) ×1 IMPLANT
LEGGING LITHOTOMY PAIR STRL (DRAPES) IMPLANT
LOOP VASCLR MAXI BLUE 18IN ST (MISCELLANEOUS) IMPLANT
LOOP VASCULAR MAXI 18 BLUE (MISCELLANEOUS)
LOOPS VASCLR MAXI BLUE 18IN ST (MISCELLANEOUS) IMPLANT
NDL HYPO 25X1 1.5 SAFETY (NEEDLE) IMPLANT
NDL SAFETY ECLIP 18X1.5 (MISCELLANEOUS) IMPLANT
NEEDLE HYPO 22GX1.5 SAFETY (NEEDLE) ×1 IMPLANT
NEEDLE HYPO 25X1 1.5 SAFETY (NEEDLE) ×1 IMPLANT
NS IRRIG 500ML POUR BTL (IV SOLUTION) ×1 IMPLANT
PACK BASIN DAY SURGERY FS (CUSTOM PROCEDURE TRAY) ×1 IMPLANT
PAD ARMBOARD 7.5X6 YLW CONV (MISCELLANEOUS) IMPLANT
PENCIL SMOKE EVACUATOR (MISCELLANEOUS) ×1 IMPLANT
SPIKE FLUID TRANSFER (MISCELLANEOUS) ×1 IMPLANT
SPONGE HEMORRHOID 8X3CM (HEMOSTASIS) IMPLANT
SPONGE SURGIFOAM ABS GEL 12-7 (HEMOSTASIS) IMPLANT
SUCTION FRAZIER HANDLE 10FR (MISCELLANEOUS)
SUCTION TUBE FRAZIER 10FR DISP (MISCELLANEOUS) IMPLANT
SUT CHROMIC 2 0 SH (SUTURE) IMPLANT
SUT CHROMIC 3 0 SH 27 (SUTURE) IMPLANT
SUT ETHIBOND 0 (SUTURE) IMPLANT
SUT MNCRL AB 4-0 PS2 18 (SUTURE) IMPLANT
SUT SILK 0 TIES 10X30 (SUTURE) ×1 IMPLANT
SUT SILK 2 0 (SUTURE)
SUT SILK 2 0 SH (SUTURE) ×1 IMPLANT
SUT SILK 2-0 18XBRD TIE 12 (SUTURE) IMPLANT
SUT VIC AB 2-0 SH 27 (SUTURE)
SUT VIC AB 2-0 SH 27XBRD (SUTURE) IMPLANT
SUT VIC AB 3-0 SH 18 (SUTURE) IMPLANT
SUT VIC AB 3-0 SH 27 (SUTURE)
SUT VIC AB 3-0 SH 27X BRD (SUTURE) IMPLANT
SUT VIC AB 3-0 SH 27XBRD (SUTURE) IMPLANT
SUT VIC AB 4-0 P-3 18XBRD (SUTURE) IMPLANT
SUT VIC AB 4-0 P3 18 (SUTURE)
SYR 20ML LL LF (SYRINGE) IMPLANT
SYR BULB IRRIG 60ML STRL (SYRINGE) ×1 IMPLANT
SYR CONTROL 10ML LL (SYRINGE) ×1 IMPLANT
SYR TB 1ML LL NO SAFETY (SYRINGE) IMPLANT
TOWEL OR 17X26 10 PK STRL BLUE (TOWEL DISPOSABLE) ×1 IMPLANT
TRAY DSU PREP LF (CUSTOM PROCEDURE TRAY) ×1 IMPLANT
TUBE CONNECTING 12X1/4 (SUCTIONS) ×1 IMPLANT
VASCULAR TIE MAXI BLUE 18IN ST (MISCELLANEOUS)
YANKAUER SUCT BULB TIP NO VENT (SUCTIONS) ×1 IMPLANT

## 2022-03-25 NOTE — H&P (Signed)
CC: Here today for surgery  HPI: Lindsay Perez is an 32 y.o. female with history of asthma, whom is seen in the office today as a referral by Dr. Tarri Glenn for evaluation of anal fissure.   Colonoscopy with Dr. Tarri Glenn 12/28/2021 demonstrated anal fissures on perianal exam that was not responding to diltiazem or nitroglycerin. Examination of the colon and TI are otherwise normal.  She reports an approximate 2 to 67-monthhistory of significant pain with defecation particularly just before and after that is sharp. She has had a fair amount of bleeding with bowel movements as well. No prior history of anal fissures or any prior anorectal surgeries/procedures.  She is taking daily MiraLAX which has been taking about a month. She has tried topical nitroglycerin and diltiazem. She was applying these 2-3 times a day. She noted some improvement in these but it is not swelling she continue to apply them for any significant period time she experienced their full effects.  She has been attempting to take a daily dose of MiraLAX, occasionally will forget on the weekend but has been working to keep her stool soft. Despite this, every couple of days she will still have "flares" with pain during and after defecation. When she has bowel movements and will often times be sharp burning type sensation. The pain will then last after bowel movements and will make it even difficult for her to sit. Over the ensuing hours it will let up. No fever or chills. No significant drainage per se either. She has tried to apply the topical nifedipine 4 times daily without any real dramatic improvement in her symptoms.  She also notes that the skin tag that she has will enlarge in size and is somewhat uncomfortable as well.  Denies any changes in health or health hx since we met in the office. States she is ready for surgery.   PMH: asthma  PSH: Denies any prior anorectal procedures or surgeries  FHx: Denies any known  family history of colorectal, breast, endometrial or ovarian cancer  Social Hx: Denies use of tobacco; social EtOH use. 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   Past Medical History:  Diagnosis Date   Anemia    as a child   Asthma    as a child   Chlamydia    GERD (gastroesophageal reflux disease)    Headache(784.0)    migranes   Seasonal allergies    Trichomonas infection     Past Surgical History:  Procedure Laterality Date   CESAREAN SECTION N/A 10/26/2017   Procedure: CESAREAN SECTION;  Surgeon: CJerelyn Charles MD;  [place] [facility]  Service: Obstetrics;  Laterality: N/A;   CESAREAN SECTION WITH BILATERAL TUBAL LIGATION Bilateral 04/28/2020   Procedure: REPEAT CESAREAN SECTION WITH BILATERAL TUBAL LIGATION;  Surgeon: CJerelyn Charles MD;  [facility];  Service: Obstetrics;  Laterality: Bilateral;   WISDOM TOOTH EXTRACTION      Family History  Adopted: Yes  Problem Relation Age of Onset   Drug abuse Mother    Hepatitis Mother    Other Father        hx unknown    Social:  reports that she has never smoked. She has never used smokeless tobacco. She reports that she does not currently use alcohol. She reports that she does not currently use drugs after having used the following drugs: Marijuana. Frequency: 7.00 times per week.  Allergies:  Allergies  Allergen Reactions   Bee Pollen Itching  Shellfish Allergy Anaphylaxis, Swelling and Other (See Comments)    Medications: I have reviewed the patient's current medications.  Results for orders placed or performed during the hospital encounter of 03/25/22 (from the past 48 hour(s))  Pregnancy, urine POC     Status: None   Collection Time: 03/25/22  6:18 AM  Result Value Ref Range   Preg Test, Ur NEGATIVE NEGATIVE    Comment:        THE SENSITIVITY OF THIS METHODOLOGY IS >24 mIU/mL     No results found.  ROS - all of the below systems have been reviewed with the patient and  positives are indicated with bold text General: chills, fever or night sweats Eyes: blurry vision or double vision ENT: epistaxis or sore throat Allergy/Immunology: itchy/watery eyes or nasal congestion Hematologic/Lymphatic: bleeding problems, blood clots or swollen lymph nodes Endocrine: temperature intolerance or unexpected weight changes Breast: new or changing breast lumps or nipple discharge Resp: cough, shortness of breath, or wheezing CV: chest pain or dyspnea on exertion GI: as per HPI GU: dysuria, trouble voiding, or hematuria MSK: joint pain or joint stiffness Neuro: TIA or stroke symptoms Derm: pruritus and skin lesion changes Psych: anxiety and depression  PE Blood pressure 128/81, pulse 82, temperature 99 F (37.2 C), temperature source Oral, resp. rate 16, height 5' 1.75" (1.568 m), weight 106.9 kg, last menstrual period 03/15/2022, SpO2 100 %, unknown if currently breastfeeding. Constitutional: NAD; conversant Eyes: Moist conjunctiva Lungs: Normal respiratory effort CV: RRR MSK: Normal range of motion of extremities Psychiatric: Appropriate affect  Results for orders placed or performed during the hospital encounter of 03/25/22 (from the past 48 hour(s))  Pregnancy, urine POC     Status: None   Collection Time: 03/25/22  6:18 AM  Result Value Ref Range   Preg Test, Ur NEGATIVE NEGATIVE    Comment:        THE SENSITIVITY OF THIS METHODOLOGY IS >24 mIU/mL     No results found.   A/P: Lindsay Perez is an 32 y.o. female with hx of asthma here for evaluation of anal fissure  -Symptoms have persisted despite taking MiraLAX and applying topical nifedipine as prescribed. She also has some symptoms related to her skin tag. We have discussed options moving forward including further observation versus surgery.  -The anatomy and physiology of the anal canal was discussed again today. The pathophysiology of chronic anal fissures was discussed as well. -We have  reviewed options going forward including further observation vs surgery -chemical sphincterotomy (perianal Botox), excision of perianal skin tag; anorectal exam under anesthesia.   -We discussed recurrence rates following chemical sphincterotomy of approximately 30 to 40%. We discussed the importance of maintaining nice soft stool with MiraLAX, avoiding spreading the buttocks on the commode, and minimizing straining.  -She has never received any sort of Botox or Dysport injections  -The planned procedure, material risks (including, but not limited to, pain, bleeding, infection, scarring, recurrence of anal fissure, incontinence of gas and/or stool although uncommon and temporary in these cases, need for additional procedures, rare cases of pelvic sepsis which in severe cases may require things like a colostomy, recurrence, pneumonia, heart attack, stroke, death) benefits and alternatives to surgery were discussed at length. I noted a good probability that the procedure would help improve their symptoms. The patient's questions were answered to her satisfaction, she voiced understanding and elected to proceed with surgery. Additionally, we discussed typical postoperative expectations and the recovery process.   Nadeen Landau, MD  [facility], [facility]

## 2022-03-25 NOTE — Op Note (Signed)
03/25/2022  9:31 AM  PATIENT:  Lindsay Perez  32 y.o. female  Patient Care Team: Dorna Mai, MD as PCP - General (Family Medicine)  PRE-OPERATIVE DIAGNOSIS:  Chronic anal fissure, perianal skin tag  POST-OPERATIVE DIAGNOSIS:  Same  PROCEDURE:   Chemical internal sphincterotomy (perianal Botox, 100U) Excision of perianal skin tag - anterior midline  SURGEON: Nadeen Landau, MD  ANESTHESIA:   local and MAC  SPECIMEN:  Anterior midline anal skin tag  DISPOSITION OF SPECIMEN:  PATHOLOGY  COUNTS:  Sponge, needle, and instrument counts were reported correct x2 at conclusion.  EBL: 2 mL  PLAN OF CARE: Discharge to home after PACU  PATIENT DISPOSITION:  PACU - hemodynamically stable.  OR FINDINGS: Short tract anal fissure in the anterior midline.  Small/grade 1 internal hemorrhoids.  Anterior midline skin tag.  Skin tag was excised and a chemical sphincterotomy carried out utilizing 100 units of Botox injected into the internal anal sphincter circumferentially.  DESCRIPTION: The patient was identified in the preoperative holding area and taken to the OR where she was placed on the operating room table. SCDs were placed.  MAC anesthesia was induced without difficulty. The patient was then positioned in high lithotomy with Allen stirrups. Pressure points were then evaluated and padded.  She was then prepped and draped in usual sterile fashion.  A surgical timeout was performed indicating the correct patient, procedure, and positioning.  A perianal block was performed using a dilute mixture of 0.25% Marcaine with epinephrine and Exparel.   After ascertaining that an appropriate level of anesthesia had been achieved, a well lubricated digital rectal exam was performed. This demonstrated no palpable masses.  Normal tone.  A Hill-Ferguson anoscope was into the anal canal and circumferential inspection demonstrated short tract anterior midline anal fissure and additionally an  associated tag distal to this.  The tag has been bothersome for her.  This was therefore planned to be excised.  This was elevated with a forcep and sharply excised staying well above the anal sphincter muscle.  Hemostasis is achieved electrocautery and the resultant defect is approximated using 3-0 chromic suture.  100 units of Botox diluted in 2 cc of injectable saline is brought onto the field.  This was reconstituted.  The intersphincteric groove was palpated.  Small aliquots are then injected circumferentially into the internal sphincter muscle up to and above the level of the anal fissure.  The anal canal was then inspected and hemostasis is verified.  All sponge, needle, and instrument counts are reported correct.  A small amount of additional local anesthetic is infiltrated at the skin tag excision site.  She is awakened from anesthesia and transferred to a stretcher for transport to recovery in satisfactory condition.  DISPOSITION: PACU in satisfactory condition.

## 2022-03-25 NOTE — Discharge Instructions (Addendum)
ANORECTAL SURGERY: POST OP INSTRUCTIONS  A chemical sphincterotomy was carried out with Botox as planned (100 units). This works to decrease the strength of the internal sphincter muscle and allow the fissure to heal. This is temporary and will wear off over the next 2-3 months. It also takes a couple days to begin to work. The small skin tag you had was also removed from the perianal region. There are absorbable sutures you may notice when wiping. These will dissolve on their own over the coming weeks.   DIET: Follow a light bland diet the first 24 hours after arrival home, such as soup, liquids, crackers, etc.  Be sure to include lots of fluids daily.  Avoid fast food or heavy meals as your are more likely to get nauseated.  Eat a low fat diet the next few days after surgery.   Some bleeding with bowel movements is expected for the first couple of days but this should stop in between bowel movements  Take your usually prescribed home medications unless otherwise directed. No foreign bodies per rectum for the next 3 months (enemas, etc)  PAIN CONTROL: It is helpful to take an over-the-counter pain medication regularly for the first few days/weeks.  Choose from the following that works best for you: Ibuprofen (Advil, etc) Three 212m tabs every 6 hours as needed. Acetaminophen (Tylenol, etc) 500-6542mevery 6 hours as needed NOTE: You may take both of these medications together - most patients find it most helpful when alternating between the two (i.e. Ibuprofen at 6am, tylenol at 9am, ibuprofen at 12pm ...)Marland KitchenA  prescription for pain medication may have been prescribed for you at discharge.  Take your pain medication as prescribed.  If you are having problems/concerns with the prescription medicine, please call usKoreaor further advice.  Avoid getting constipated.  Between the surgery and the pain medications, it is common to experience some constipation.  Increasing fluid intake (64oz of water per  day) and taking a fiber supplement (such as Metamucil, Citrucel, FiberCon) 1-2 times a day regularly will usually help prevent this problem from occurring.  Take Miralax (over the counter) 1-2x/day while taking a narcotic pain medication. If no bowel movement after 48hours, you may additionally take a laxative like a bottle of Milk of Magnesia which can be purchased over the counter. Avoid enemas.   Watch out for diarrhea.  If you have many loose bowel movements, simplify your diet to bland foods.  Stop any stool softeners and decrease your fiber supplement. If this worsens or does not improve, please call usKorea Wash / shower every day.  If you were discharged with a dressing, you may remove this the day after your surgery. You may shower normally, getting soap/water on your wound, particularly after bowel movements.  Soaking in a warm bath filled a couple inches ("Sitz bath") is a great way to clean the area after a bowel movement and many patients find it is a way to soothe the area.  ACTIVITIES as tolerated:   You may resume regular (light) daily activities beginning the next day--such as daily self-care, walking, climbing stairs--gradually increasing activities as tolerated.  If you can walk 30 minutes without difficulty, it is safe to try more intense activity such as jogging, treadmill, bicycling, low-impact aerobics, etc. Refrain from any heavy lifting or straining for the first 2 weeks after your procedure, particularly if your surgery was for hemorrhoids. Avoid activities that make your pain worse You may drive when you are  no longer taking prescription pain medication, you can comfortably wear a seatbelt, and you can safely maneuver your car and apply brakes.  FOLLOW UP in our office Please call [facility] at [phone] to set up an appointment to see your surgeon in the office for a follow-up appointment approximately 2 weeks after your surgery. Make sure that you call for this appointment the  day you arrive home to insure a convenient appointment time.  9. If you have disability or family leave forms that need to be completed, you may have them completed by your primary care physician's office; for return to work instructions, please ask our office staff and they will be happy to assist you in obtaining this documentation   When to call us [phone]: Poor pain control Reactions / problems with new medications (rash/itching, etc)  Fever over 101.5 F (38.5 C) Inability to urinate Nausea/vomiting Worsening swelling or bruising Continued bleeding from incision. Increased pain, redness, or drainage from the incision  The clinic staff is available to answer your questions during regular business hours (8:30am-5pm).  Please don't hesitate to call and ask to speak to one of our nurses for clinical concerns.   A surgeon from [facility] is always on call at the hospitals   If you have a medical emergency, go to the nearest emergency room or call 911.   [facility] [address], [place], [place], [place]  [address] MAIN: [id number] FAX: [phone] [email or web address]  Post Anesthesia Home Care Instructions  Activity: Get plenty of rest for the remainder of the day. A responsible adult should stay with you for 24 hours following the procedure.  For the next 24 hours, DO NOT: -Drive a car -Paediatric nurse -Drink alcoholic beverages -Take any medication unless instructed by your physician -Make any legal decisions or sign important papers.  Meals: Start with liquid foods such as gelatin or soup. Progress to regular foods as tolerated. Avoid greasy, spicy, heavy foods. If nausea and/or vomiting occur, drink only clear liquids until the nausea and/or vomiting subsides. Call your physician if vomiting continues.  Special Instructions/Symptoms: Your throat may feel dry or sore from the anesthesia or  the breathing tube placed in your throat during surgery. If this causes discomfort, gargle with warm salt water. The discomfort should disappear within 24 hours.       Information for Discharge Teaching: EXPAREL (bupivacaine liposome injectable suspension)   Your surgeon gave you EXPAREL(bupivacaine) in your surgical incision to help control your pain after surgery.  EXPAREL is a local anesthetic that provides pain relief by numbing the tissue around the surgical site. EXPAREL is designed to release pain medication over time and can control pain for up to 72 hours. Depending on how you respond to EXPAREL, you may require less pain medication during your recovery.  Possible side effects: Temporary loss of sensation or ability to move in the area where bupivacaine was injected. Nausea, vomiting, constipation Rarely, numbness and tingling in your mouth or lips, lightheadedness, or anxiety may occur. Call your doctor right away if you think you may be experiencing any of these sensations, or if you have other questions regarding possible side effects.  Follow all other discharge instructions given to you by your surgeon or nurse. Eat a healthy diet and drink plenty of water or other fluids.  If you return to the hospital for any reason within 96 hours following the administration of EXPAREL, please inform your  health care providers.

## 2022-03-25 NOTE — Transfer of Care (Signed)
Immediate Anesthesia Transfer of Care Note  Patient: Lindsay Perez  Procedure(s) Performed: CHEMICAL SPHINCTEROTOMY, BOTOX (Rectum) REMOVAL OF SKIN TAG (Rectum) ANORECTAL EXAM UNDER ANESTHESIA (Rectum)  Patient Location: PACU  Anesthesia Type:MAC  Level of Consciousness: awake, alert , oriented, and patient cooperative  Airway & Oxygen Therapy: Patient Spontanous Breathing  Post-op Assessment: Report given to RN and Post -op Vital signs reviewed and stable  Post vital signs: Reviewed and stable  Last Vitals:  Vitals Value Taken Time  BP 134/82 03/25/22 0938  Temp    Pulse 85 03/25/22 0941  Resp 21 03/25/22 0941  SpO2 100 % 03/25/22 0941  Vitals shown include unvalidated device data.  Last Pain:  Vitals:   03/25/22 0631  TempSrc: Oral  PainSc: 6       Patients Stated Pain Goal: 5 ([id number])  Complications: No notable events documented.

## 2022-03-25 NOTE — Anesthesia Postprocedure Evaluation (Signed)
Anesthesia Post Note  Patient: Lindsay Perez  Procedure(s) Performed: CHEMICAL SPHINCTEROTOMY, BOTOX (Rectum) REMOVAL OF SKIN TAG (Rectum) ANORECTAL EXAM UNDER ANESTHESIA (Rectum)     Patient location during evaluation: PACU Anesthesia Type: General Level of consciousness: awake and alert Pain management: pain level controlled Vital Signs Assessment: post-procedure vital signs reviewed and stable Respiratory status: spontaneous breathing, nonlabored ventilation, respiratory function stable and patient connected to nasal cannula oxygen Cardiovascular status: blood pressure returned to baseline and stable Postop Assessment: no apparent nausea or vomiting Anesthetic complications: no  No notable events documented.  Last Vitals:  Vitals:   03/25/22 1028 03/25/22 1115  BP:  128/84  Pulse: 75 71  Resp: 16 16  Temp: 36.5 C   SpO2: 100% 100%    Last Pain:  Vitals:   03/25/22 1115  TempSrc:   PainSc: 0-No pain                 Barnet Glasgow

## 2022-03-28 ENCOUNTER — Encounter (HOSPITAL_BASED_OUTPATIENT_CLINIC_OR_DEPARTMENT_OTHER): Payer: Self-pay | Admitting: Surgery

## 2022-03-28 LAB — SURGICAL PATHOLOGY

## 2022-04-20 ENCOUNTER — Ambulatory Visit: Payer: 59 | Admitting: Family Medicine

## 2022-05-09 ENCOUNTER — Encounter: Payer: Self-pay | Admitting: Nurse Practitioner

## 2022-05-09 DIAGNOSIS — R600 Localized edema: Secondary | ICD-10-CM | POA: Insufficient documentation

## 2022-05-09 DIAGNOSIS — R609 Edema, unspecified: Secondary | ICD-10-CM | POA: Insufficient documentation

## 2022-05-09 NOTE — Assessment & Plan Note (Signed)
Will order labs  Stay active - walking routine  Stay well hydrated  Wear compression hose  Will order low dose lasix for 2 days     Follow up:  Follow up for weight loss

## 2022-05-12 ENCOUNTER — Ambulatory Visit: Payer: 59 | Admitting: Family Medicine

## 2022-05-30 DIAGNOSIS — K601 Chronic anal fissure: Secondary | ICD-10-CM | POA: Diagnosis not present

## 2022-06-09 ENCOUNTER — Other Ambulatory Visit (HOSPITAL_COMMUNITY): Payer: Self-pay

## 2022-06-09 ENCOUNTER — Ambulatory Visit (INDEPENDENT_AMBULATORY_CARE_PROVIDER_SITE_OTHER): Payer: 59 | Admitting: Family Medicine

## 2022-06-09 VITALS — BP 129/84 | HR 71 | Temp 98.1°F | Resp 16 | Wt 239.0 lb

## 2022-06-09 DIAGNOSIS — Z7689 Persons encountering health services in other specified circumstances: Secondary | ICD-10-CM | POA: Diagnosis not present

## 2022-06-09 DIAGNOSIS — Z6841 Body Mass Index (BMI) 40.0 and over, adult: Secondary | ICD-10-CM | POA: Diagnosis not present

## 2022-06-09 MED ORDER — PHENTERMINE HCL 37.5 MG PO CAPS
37.5000 mg | ORAL_CAPSULE | ORAL | 0 refills | Status: DC
  Filled 2022-06-09: qty 30, 30d supply, fill #0

## 2022-06-09 NOTE — Progress Notes (Signed)
Patient came in for monthly weight check. Patient has no other concerns today  

## 2022-06-14 ENCOUNTER — Encounter: Payer: Self-pay | Admitting: Family Medicine

## 2022-06-14 NOTE — Progress Notes (Signed)
Established Patient Office Visit  Subjective    Patient ID: Lindsay Perez, female    DOB: 02-Sep-1990  Age: 32 y.o. MRN: [id number]  CC: No chief complaint on file.   HPI [name] presents for routine weight management. Patient denies acute complaints or concerns.    Outpatient Encounter Medications as of 06/09/2022  Medication Sig   acetaminophen (TYLENOL) 500 MG tablet Take 1,000 mg by mouth as needed for moderate pain.   albuterol (VENTOLIN HFA) 108 (90 Base) MCG/ACT inhaler Inhale 2 puffs into the lungs every 6 (six) hours as needed for wheezing or shortness of breath.   AMBULATORY NON FORMULARY MEDICATION Medication Name: Diltiazem 2% mixed with Lidocaine 5% Sig: apply a pea size amount inside rectum three times a day   ibuprofen (ADVIL) 800 MG tablet Take 1 tablet by mouth every 6 - 8 hours as needed for pain   polyethylene glycol powder (GLYCOLAX/MIRALAX) 17 GM/SCOOP powder Mix 17 g of powder in 4 oz of water or juice & drink by mouth daily.   promethazine (PHENERGAN) 12.5 MG tablet Take 1 tablet (12.5 mg total) by mouth every 8 (eight) hours as needed for nausea or vomiting.   [DISCONTINUED] phentermine 37.5 MG capsule Take 1 capsule by mouth every morning.   phentermine 37.5 MG capsule Take 1 capsule by mouth every morning.   Facility-Administered Encounter Medications as of 06/09/2022  Medication   bupivacaine liposome (EXPAREL) 1.3 % injection 266 mg    Past Medical History:  Diagnosis Date   Anemia    as a child   Asthma    as a child   Chlamydia    GERD (gastroesophageal reflux disease)    Headache(784.0)    migranes   Seasonal allergies    Trichomonas infection     Past Surgical History:  Procedure Laterality Date   CESAREAN SECTION N/A 10/26/2017   Procedure: CESAREAN SECTION;  Surgeon: Marlow Baars, MD;  Location: [facility]THING SU[facility];  Service: Obstetrics;  Laterality: N/A;   CESAREAN SECTION WITH BILATERAL TUBAL LIGATION Bilateral 04/28/2020    Procedure: REPEAT CESAREAN SECTION WITH BILATERAL TUBAL LIGATION;  Surgeon: Marlow Baars, MD;  Location: [facility];  Service: Obstetrics;  Laterality: Bilateral;   EXCISION OF SKIN TAG N/A 03/25/2022   Procedure: REMOVAL OF SKIN TAG;  Surgeon: Andria Meuse, MD;  Location: [facility];  Service: General;  Laterality: N/A;   RECTAL EXAM UNDER ANESTHESIA N/A 03/25/2022   Procedure: ANORECTAL EXAM UNDER ANESTHESIA;  Surgeon: Andria Meuse, MD;  Location: [facility];  Service: General;  Laterality: N/A;   SPHINCTEROTOMY N/A 03/25/2022   Procedure: CHEMICAL SPHINCTEROTOMY, BOTOX;  Surgeon: Andria Meuse, MD;  Location: [facility];  Service: General;  Laterality: N/A;   WISDOM TOOTH EXTRACTION      Family History  Adopted: Yes  Problem Relation Age of Onset   Drug abuse Mother    Hepatitis Mother    Other Father        hx unknown    Social History   Socioeconomic History   Marital status: Single    Spouse name: Not on file   Number of children: 2   Years of education: some colle   Highest education level: Associate degree: academic program  Occupational History    Employer: [employer]    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    Some college, starting back in January.   [name], age 79.        Social Determinants of Health   Financial Resource Strain: Low Risk  (06/09/2022)   Overall Financial Resource Strain (CARDIA)    Difficulty of Paying Living Expenses:  Not very hard  Food Insecurity: No Food Insecurity (06/09/2022)   Hunger Vital Sign    Worried About Running Out of Food in the Last Year: Never true    Ran Out of Food in the Last Year: Never true  Transportation Needs: No Transportation Needs (06/09/2022)   PRAPARE - Administrator, Civil Service (Medical): No    Lack of Transportation (Non-Medical): No  Physical Activity: Sufficiently Active (06/09/2022)   Exercise Vital Sign    Days of Exercise per Week: 3 days    Minutes of Exercise per Session: 60 min  Stress: No Stress Concern Present (06/09/2022)   [employer] of Occupational Health - Occupational Stress Questionnaire    Feeling of Stress : Not at all  Social Connections: Unknown (06/09/2022)   Social Connection and Isolation Panel [NHANES]    Frequency of Communication with Friends and Family: More than three times a week    Frequency of Social Gatherings with Friends and Family: Twice a week    Attends Religious Services: Patient declined    Database administrator or Organizations: No    Attends Engineer, structural: Not on file    Marital Status: Never married  Intimate Partner Violence: Not on file    Review of Systems  All other systems reviewed and are negative.       Objective    BP 129/84   Pulse 71   Temp 98.1 F (36.7 C) (Oral)   Resp 16   Wt 239 lb (108.4 kg)   SpO2 97%   BMI 44.07 kg/m   Physical Exam Vitals and nursing note reviewed.  Constitutional:      General: She is not in acute distress.    Appearance: She is obese.  Cardiovascular:     Rate and Rhythm: Normal rate and regular rhythm.  Pulmonary:     Effort: Pulmonary effort is normal.     Breath sounds: Normal breath sounds.  Neurological:     General: No focal deficit present.     Mental Status: She is alert and oriented to person, place, and time.         Assessment & Plan:   1. Encounter for weight management Phentermine refilled.   2. Class 3  severe obesity due to excess calories without serious comorbidity with body mass index (BMI) of 40.0 to 44.9 in adult [facility]) Discussed dietary and activity options.     Return in about 4 weeks (around 07/07/2022) for follow up.   [name], MD

## 2022-07-13 ENCOUNTER — Ambulatory Visit: Payer: Self-pay | Admitting: Family Medicine

## 2022-07-15 ENCOUNTER — Ambulatory Visit (INDEPENDENT_AMBULATORY_CARE_PROVIDER_SITE_OTHER): Payer: 59 | Admitting: Family Medicine

## 2022-07-15 ENCOUNTER — Other Ambulatory Visit (HOSPITAL_COMMUNITY): Payer: Self-pay

## 2022-07-15 ENCOUNTER — Encounter: Payer: Self-pay | Admitting: Family Medicine

## 2022-07-15 VITALS — Wt 234.0 lb

## 2022-07-15 DIAGNOSIS — Z7689 Persons encountering health services in other specified circumstances: Secondary | ICD-10-CM | POA: Diagnosis not present

## 2022-07-15 DIAGNOSIS — Z6841 Body Mass Index (BMI) 40.0 and over, adult: Secondary | ICD-10-CM

## 2022-07-15 MED ORDER — PHENTERMINE HCL 37.5 MG PO CAPS
37.5000 mg | ORAL_CAPSULE | ORAL | 0 refills | Status: DC
  Filled 2022-07-15: qty 30, 30d supply, fill #0

## 2022-07-15 NOTE — Progress Notes (Signed)
Established Patient Office Visit  Subjective    Patient ID: Lindsay Perez, female    DOB: 1990/10/03  Age: 32 y.o. MRN: [id number]  CC: No chief complaint on file.   HPI Lindsay Perez presents for routine weight management. Patient denies acute complaints or concerns.    Outpatient Encounter Medications as of 07/15/2022  Medication Sig   acetaminophen (TYLENOL) 500 MG tablet Take 1,000 mg by mouth as needed for moderate pain.   albuterol (VENTOLIN HFA) 108 (90 Base) MCG/ACT inhaler Inhale 2 puffs into the lungs every 6 (six) hours as needed for wheezing or shortness of breath.   AMBULATORY NON FORMULARY MEDICATION Medication Name: Diltiazem 2% mixed with Lidocaine 5% Sig: apply a pea size amount inside rectum three times a day   ibuprofen (ADVIL) 800 MG tablet Take 1 tablet by mouth every 6 - 8 hours as needed for pain   phentermine 37.5 MG capsule Take 1 capsule by mouth every morning.   polyethylene glycol powder (GLYCOLAX/MIRALAX) 17 GM/SCOOP powder Mix 17 g of powder in 4 oz of water or juice & drink by mouth daily.   promethazine (PHENERGAN) 12.5 MG tablet Take 1 tablet (12.5 mg total) by mouth every 8 (eight) hours as needed for nausea or vomiting.   [DISCONTINUED] phentermine 37.5 MG capsule Take 1 capsule by mouth every morning.   Facility-Administered Encounter Medications as of 07/15/2022  Medication   bupivacaine liposome (EXPAREL) 1.3 % injection 266 mg    Past Medical History:  Diagnosis Date   Anemia    as a child   Asthma    as a child   Chlamydia    GERD (gastroesophageal reflux disease)    Headache(784.0)    migranes   Seasonal allergies    Trichomonas infection     Past Surgical History:  Procedure Laterality Date   CESAREAN SECTION N/A 10/26/2017   Procedure: CESAREAN SECTION;  Surgeon: Marlow Baars, MD;  Location: [facility]IRTHING SU[facility];  Service: Obstetrics;  Laterality: N/A;   CESAREAN SECTION WITH BILATERAL TUBAL LIGATION Bilateral 04/28/2020    Procedure: REPEAT CESAREAN SECTION WITH BILATERAL TUBAL LIGATION;  Surgeon: Marlow Baars, MD;  Location: [facility];  Service: Obstetrics;  Laterality: Bilateral;   EXCISION OF SKIN TAG N/A 03/25/2022   Procedure: REMOVAL OF SKIN TAG;  Surgeon: Andria Meuse, MD;  Location: [facility];  Service: General;  Laterality: N/A;   RECTAL EXAM UNDER ANESTHESIA N/A 03/25/2022   Procedure: ANORECTAL EXAM UNDER ANESTHESIA;  Surgeon: Andria Meuse, MD;  Location: [facility];  Service: General;  Laterality: N/A;   SPHINCTEROTOMY N/A 03/25/2022   Procedure: CHEMICAL SPHINCTEROTOMY, BOTOX;  Surgeon: Andria Meuse, MD;  Location: [facility];  Service: General;  Laterality: N/A;   WISDOM TOOTH EXTRACTION      Family History  Adopted: Yes  Problem Relation Age of Onset   Drug abuse Mother    Hepatitis Mother    Other Father        hx unknown    Social History   Socioeconomic History   Marital status: Single    Spouse name: Not on file   Number of children: 2   Years of education: some colle   Highest education level: Associate degree: academic program  Occupational History    Employer: [employer]    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    Some college, starting back in January.   [name], age 70.        Social Determinants of Health   Financial Resource Strain: Low Risk  (06/09/2022)   Overall Financial Resource Strain (CARDIA)    Difficulty of Paying Living Expenses:  Not very hard  Food Insecurity: No Food Insecurity (06/09/2022)   Hunger Vital Sign    Worried About Running Out of Food in the Last Year: Never true    Ran Out of Food in the Last Year: Never true  Transportation Needs: No Transportation Needs (06/09/2022)   PRAPARE - Administrator, Civil Service (Medical): No    Lack of Transportation (Non-Medical): No  Physical Activity: Sufficiently Active (06/09/2022)   Exercise Vital Sign    Days of Exercise per Week: 3 days    Minutes of Exercise per Session: 60 min  Stress: No Stress Concern Present (06/09/2022)   [employer] of Occupational Health - Occupational Stress Questionnaire    Feeling of Stress : Not at all  Social Connections: Unknown (06/09/2022)   Social Connection and Isolation Panel [NHANES]    Frequency of Communication with Friends and Family: More than three times a week    Frequency of Social Gatherings with Friends and Family: Twice a week    Attends Religious Services: Patient declined    Database administrator or Organizations: No    Attends Engineer, structural: Not on file    Marital Status: Never married  Intimate Partner Violence: Not on file    Review of Systems  All other systems reviewed and are negative.       Objective    Wt 234 lb (106.1 kg)   BMI 43.15 kg/m   Physical Exam Vitals and nursing note reviewed.  Constitutional:      General: She is not in acute distress.    Appearance: She is obese.  Cardiovascular:     Rate and Rhythm: Normal rate and regular rhythm.  Pulmonary:     Effort: Pulmonary effort is normal.     Breath sounds: Normal breath sounds.  Neurological:     General: No focal deficit present.     Mental Status: She is alert and oriented to person, place, and time.         Assessment & Plan:   1. Encounter for weight management Phentermine prescribed. Goal is 3-5lbs/mo wt loss.   2. Class 3 severe obesity due to excess calories without serious  comorbidity with body mass index (BMI) of 40.0 to 44.9 in adult [facility])     Return in about 4 weeks (around 08/12/2022) for follow up.   [name], MD

## 2022-07-21 ENCOUNTER — Ambulatory Visit: Payer: Self-pay

## 2022-07-21 ENCOUNTER — Other Ambulatory Visit (HOSPITAL_COMMUNITY): Payer: Self-pay

## 2022-07-21 MED ORDER — FLUCONAZOLE 150 MG PO TABS
ORAL_TABLET | ORAL | 0 refills | Status: DC
  Filled 2022-07-21: qty 2, 2d supply, fill #0

## 2022-07-21 NOTE — Telephone Encounter (Signed)
Patient was called and encouraged to go to [facility] for treatment of possible BV/UTI  Copied from CRM [id number]. Topic: General - Other >> Jul 21, 2022 11:19 AM Macon Large wrote: Reason for CRM: Pt reports that she spoke with a nurse earlier today and was calling back for an update. Pt requests call back asap.

## 2022-07-21 NOTE — Telephone Encounter (Signed)
  Patient thinks she has a yeast infection. Patient said it started with vaginal itch 2 days ago. Please advise.     Chief Complaint: Internal vaginal itching. "I have a yeast infection." No availability. Asking to come "and do a swab test."  Symptoms: Above Frequency: 2 days ago Pertinent Negatives: Patient denies discharge Disposition: [] ED /[] Urgent Care (no appt availability in office) / [] Appointment(In office/virtual)/ []  [place] [facility]/ [] Home Care/ [] Refused Recommended Disposition /[]  Mobile Bus/ [x]  Follow-up with PCP Additional Notes: Please advise pt.  Answer Assessment - Initial Assessment Questions 1. SYMPTOM: "What's the main symptom you're concerned about?" (e.g., pain, itching, dryness)     Itching 2. LOCATION: "Where is the   located?" (e.g., inside/outside, left/right)     Inside 3. ONSET: "When did the    start?"     2 days ago 4. PAIN: "Is there any pain?" If Yes, ask: "How bad is it?" (Scale: 1-10; mild, moderate, severe)   -  MILD (1-3): Doesn't interfere with normal activities.    -  MODERATE (4-7): Interferes with normal activities (e.g., work or school) or awakens from sleep.     -  SEVERE (8-10): Excruciating pain, unable to do any normal activities.     No 5. ITCHING: "Is there any itching?" If Yes, ask: "How bad is it?" (Scale: 1-10; mild, moderate, severe)     Severe 6. CAUSE: "What do you think is causing the discharge?" "Have you had the same problem before? What happened then?"     Yeast 7. OTHER SYMPTOMS: "Do you have any other symptoms?" (e.g., fever, itching, vaginal bleeding, pain with urination, injury to genital area, vaginal foreign body)     No 8. PREGNANCY: "Is there any chance you are pregnant?" "When was your last menstrual period?"     No  Protocols used: Vaginal Symptoms-A-AH

## 2022-08-23 ENCOUNTER — Other Ambulatory Visit (HOSPITAL_COMMUNITY): Payer: Self-pay

## 2022-08-23 ENCOUNTER — Ambulatory Visit: Payer: 59 | Admitting: Family Medicine

## 2022-08-25 DIAGNOSIS — N760 Acute vaginitis: Secondary | ICD-10-CM | POA: Diagnosis not present

## 2022-08-27 ENCOUNTER — Other Ambulatory Visit (HOSPITAL_COMMUNITY): Payer: Self-pay

## 2022-08-29 ENCOUNTER — Other Ambulatory Visit (HOSPITAL_COMMUNITY): Payer: Self-pay

## 2022-08-29 MED ORDER — FLUCONAZOLE 150 MG PO TABS
ORAL_TABLET | ORAL | 0 refills | Status: AC
  Filled 2022-08-29: qty 2, 3d supply, fill #0

## 2022-09-02 ENCOUNTER — Other Ambulatory Visit (HOSPITAL_COMMUNITY): Payer: Self-pay

## 2022-09-12 ENCOUNTER — Ambulatory Visit: Payer: 59 | Admitting: Family Medicine

## 2022-10-06 ENCOUNTER — Ambulatory Visit: Payer: 59 | Admitting: Family Medicine

## 2022-10-06 ENCOUNTER — Other Ambulatory Visit (HOSPITAL_COMMUNITY): Payer: Self-pay

## 2022-10-06 VITALS — BP 134/86 | HR 78 | Temp 98.1°F | Resp 16 | Wt 230.0 lb

## 2022-10-06 DIAGNOSIS — Z6841 Body Mass Index (BMI) 40.0 and over, adult: Secondary | ICD-10-CM | POA: Diagnosis not present

## 2022-10-06 DIAGNOSIS — Z7689 Persons encountering health services in other specified circumstances: Secondary | ICD-10-CM | POA: Diagnosis not present

## 2022-10-06 DIAGNOSIS — K5900 Constipation, unspecified: Secondary | ICD-10-CM

## 2022-10-06 DIAGNOSIS — G43909 Migraine, unspecified, not intractable, without status migrainosus: Secondary | ICD-10-CM

## 2022-10-06 MED ORDER — PHENTERMINE HCL 37.5 MG PO CAPS
37.5000 mg | ORAL_CAPSULE | ORAL | 0 refills | Status: DC
  Filled 2022-10-06 – 2022-10-25 (×2): qty 30, 30d supply, fill #0

## 2022-10-06 NOTE — Progress Notes (Signed)
Weight check for patient. Patient is currently on phentermine. No other concerns today

## 2022-10-10 ENCOUNTER — Encounter: Payer: Self-pay | Admitting: Family Medicine

## 2022-10-10 NOTE — Progress Notes (Signed)
Established Patient Office Visit  Subjective    Patient ID: Lindsay Perez, female    DOB: 1990/09/07  Age: 32 y.o. MRN: [id number]  CC:  Chief Complaint  Patient presents with   Weight Check    HPI Lindsay Perez presents for weight management. She also reports constipation and intermittent migraines.     Outpatient Encounter Medications as of 10/06/2022  Medication Sig   acetaminophen (TYLENOL) 500 MG tablet Take 1,000 mg by mouth as needed for moderate pain.   albuterol (VENTOLIN HFA) 108 (90 Base) MCG/ACT inhaler Inhale 2 puffs into the lungs every 6 (six) hours as needed for wheezing or shortness of breath.   AMBULATORY NON FORMULARY MEDICATION Medication Name: Diltiazem 2% mixed with Lidocaine 5% Sig: apply a pea size amount inside rectum three times a day   fluconazole (DIFLUCAN) 150 MG tablet Take 1 tablet by mouth every 72 hours   ibuprofen (ADVIL) 800 MG tablet Take 1 tablet by mouth every 6 - 8 hours as needed for pain   polyethylene glycol powder (GLYCOLAX/MIRALAX) 17 GM/SCOOP powder Mix 17 g of powder in 4 oz of water or juice & drink by mouth daily.   promethazine (PHENERGAN) 12.5 MG tablet Take 1 tablet (12.5 mg total) by mouth every 8 (eight) hours as needed for nausea or vomiting.   [DISCONTINUED] phentermine 37.5 MG capsule Take 1 capsule by mouth every morning.   phentermine 37.5 MG capsule Take 1 capsule (37.5 mg total) by mouth every morning.   Facility-Administered Encounter Medications as of 10/06/2022  Medication   bupivacaine liposome (EXPAREL) 1.3 % injection 266 mg    Past Medical History:  Diagnosis Date   Anemia    as a child   Asthma    as a child   Chlamydia    GERD (gastroesophageal reflux disease)    Headache(784.0)    migranes   Seasonal allergies    Trichomonas infection     Past Surgical History:  Procedure Laterality Date   CESAREAN SECTION N/A 10/26/2017   Procedure: CESAREAN SECTION;  Surgeon: Marlow Baars, MD;  Location:  [facility];  Service: Obstetrics;  Laterality: N/A;   CESAREAN SECTION WITH BILATERAL TUBAL LIGATION Bilateral 04/28/2020   Procedure: REPEAT CESAREAN SECTION WITH BILATERAL TUBAL LIGATION;  Surgeon: Marlow Baars, MD;  Location: [facility];  Service: Obstetrics;  Laterality: Bilateral;   EXCISION OF SKIN TAG N/A 03/25/2022   Procedure: REMOVAL OF SKIN TAG;  Surgeon: Andria Meuse, MD;  Location: [facility];  Service: General;  Laterality: N/A;   RECTAL EXAM UNDER ANESTHESIA N/A 03/25/2022   Procedure: ANORECTAL EXAM UNDER ANESTHESIA;  Surgeon: Andria Meuse, MD;  Location: [facility];  Service: General;  Laterality: N/A;   SPHINCTEROTOMY N/A 03/25/2022   Procedure: CHEMICAL SPHINCTEROTOMY, BOTOX;  Surgeon: Andria Meuse, MD;  Location: [facility];  Service: General;  Laterality: N/A;   WISDOM TOOTH EXTRACTION      Family History  Adopted: Yes  Problem Relation Age of Onset   Drug abuse Mother    Hepatitis Mother    Other Father        hx unknown    Social History   Socioeconomic History   Marital status: Single    Spouse name: Not on file   Number of children: 2   Years of education: some colle   Highest education level: Associate degree: academic program  Occupational History    Employer: [employer]  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    Some college, starting back in January.   [name], age 33.        Social Determinants of Health    Financial Resource Strain: Low Risk  (06/09/2022)   Overall Financial Resource Strain (CARDIA)    Difficulty of Paying Living Expenses: Not very hard  Food Insecurity: No Food Insecurity (06/09/2022)   Hunger Vital Sign    Worried About Running Out of Food in the Last Year: Never true    Ran Out of Food in the Last Year: Never true  Transportation Needs: No Transportation Needs (06/09/2022)   PRAPARE - Administrator, Civil Service (Medical): No    Lack of Transportation (Non-Medical): No  Physical Activity: Sufficiently Active (06/09/2022)   Exercise Vital Sign    Days of Exercise per Week: 3 days    Minutes of Exercise per Session: 60 min  Stress: No Stress Concern Present (06/09/2022)   [employer] of Occupational Health - Occupational Stress Questionnaire    Feeling of Stress : Not at all  Social Connections: Unknown (06/09/2022)   Social Connection and Isolation Panel [NHANES]    Frequency of Communication with Friends and Family: More than three times a week    Frequency of Social Gatherings with Friends and Family: Twice a week    Attends Religious Services: Patient declined    Database administrator or Organizations: No    Attends Engineer, structural: Not on file    Marital Status: Never married  Intimate Partner Violence: Unknown (05/20/2021)   Received from [facility]   HITS    Physically Hurt: Not on file    Insult or Talk Down To: Not on file    Threaten Physical Harm: Not on file    Scream or Curse: Not on file    Review of Systems  Gastrointestinal:  Positive for constipation.  Neurological:  Positive for headaches. Negative for dizziness and focal weakness.  All other systems reviewed and are negative.       Objective    BP 134/86   Pulse 78   Temp 98.1 F (36.7 C) (Oral)   Resp 16   Wt 230 lb (104.3 kg)   BMI 42.41 kg/m   Physical Exam Vitals and nursing note reviewed.  Constitutional:      General: She is not in  acute distress.    Appearance: She is obese.  HENT:     Head: Normocephalic and atraumatic.  Cardiovascular:     Rate and Rhythm: Normal rate and regular rhythm.  Pulmonary:     Effort: Pulmonary effort is normal.     Breath sounds: Normal breath sounds.  Neurological:     General: No focal deficit present.     Mental Status: She is alert and oriented to person, place, and time.  Psychiatric:        Mood  and Affect: Mood normal.         Assessment & Plan:   1. Encounter for weight management Doing well. Phentermine refilled.   2. Class 3 severe obesity due to excess calories without serious comorbidity with body mass index (BMI) of 40.0 to 44.9 in adult (HCC)   3. Constipation, unspecified constipation type Recommend adequate fluid and fiber  4. Migraine without status migrainosus, not intractable, unspecified migraine type Recommend headache diary.     Return in about 4 weeks (around 11/03/2022) for follow up.   [name], MD

## 2022-10-19 ENCOUNTER — Other Ambulatory Visit (HOSPITAL_COMMUNITY): Payer: Self-pay

## 2022-10-25 ENCOUNTER — Other Ambulatory Visit (HOSPITAL_COMMUNITY): Payer: Self-pay

## 2022-11-02 ENCOUNTER — Other Ambulatory Visit (HOSPITAL_COMMUNITY): Payer: Self-pay

## 2022-11-02 ENCOUNTER — Encounter: Payer: Self-pay | Admitting: Family Medicine

## 2022-11-02 ENCOUNTER — Ambulatory Visit (INDEPENDENT_AMBULATORY_CARE_PROVIDER_SITE_OTHER): Payer: 59 | Admitting: Family Medicine

## 2022-11-02 VITALS — BP 128/82 | HR 84 | Temp 98.1°F | Resp 16 | Wt 237.0 lb

## 2022-11-02 DIAGNOSIS — Z7689 Persons encountering health services in other specified circumstances: Secondary | ICD-10-CM | POA: Diagnosis not present

## 2022-11-02 DIAGNOSIS — Z6841 Body Mass Index (BMI) 40.0 and over, adult: Secondary | ICD-10-CM

## 2022-11-02 MED ORDER — PHENTERMINE HCL 37.5 MG PO CAPS
37.5000 mg | ORAL_CAPSULE | ORAL | 0 refills | Status: AC
  Filled 2022-11-02: qty 30, 30d supply, fill #0

## 2022-11-07 ENCOUNTER — Encounter: Payer: Self-pay | Admitting: Family Medicine

## 2022-11-07 NOTE — Progress Notes (Signed)
Established Patient Office Visit  Subjective    Patient ID: Lindsay Perez, female    DOB: 1990/06/12  Age: 32 y.o. MRN: [id number]  CC:  Chief Complaint  Patient presents with   Weight Loss    HPI Lindsay Perez presents for weight management. Patient denies acute complaints or concerns.   Outpatient Encounter Medications as of 11/02/2022  Medication Sig   acetaminophen (TYLENOL) 500 MG tablet Take 1,000 mg by mouth as needed for moderate pain.   albuterol (VENTOLIN HFA) 108 (90 Base) MCG/ACT inhaler Inhale 2 puffs into the lungs every 6 (six) hours as needed for wheezing or shortness of breath.   AMBULATORY NON FORMULARY MEDICATION Medication Name: Diltiazem 2% mixed with Lidocaine 5% Sig: apply a pea size amount inside rectum three times a day   fluconazole (DIFLUCAN) 150 MG tablet Take 1 tablet by mouth every 72 hours   ibuprofen (ADVIL) 800 MG tablet Take 1 tablet by mouth every 6 - 8 hours as needed for pain   phentermine 37.5 MG capsule Take 1 capsule (37.5 mg total) by mouth every morning.   polyethylene glycol powder (GLYCOLAX/MIRALAX) 17 GM/SCOOP powder Mix 17 g of powder in 4 oz of water or juice & drink by mouth daily.   promethazine (PHENERGAN) 12.5 MG tablet Take 1 tablet (12.5 mg total) by mouth every 8 (eight) hours as needed for nausea or vomiting.   traMADol (ULTRAM) 50 MG tablet    [DISCONTINUED] phentermine 37.5 MG capsule Take 1 capsule (37.5 mg total) by mouth every morning.   Facility-Administered Encounter Medications as of 11/02/2022  Medication   bupivacaine liposome (EXPAREL) 1.3 % injection 266 mg    Past Medical History:  Diagnosis Date   Anemia    as a child   Asthma    as a child   Chlamydia    GERD (gastroesophageal reflux disease)    Headache(784.0)    migranes   Seasonal allergies    Trichomonas infection     Past Surgical History:  Procedure Laterality Date   CESAREAN SECTION N/A 10/26/2017   Procedure: CESAREAN SECTION;   Surgeon: Marlow Baars, MD;  Location: [facility];  Service: Obstetrics;  Laterality: N/A;   CESAREAN SECTION WITH BILATERAL TUBAL LIGATION Bilateral 04/28/2020   Procedure: REPEAT CESAREAN SECTION WITH BILATERAL TUBAL LIGATION;  Surgeon: Marlow Baars, MD;  Location: [facility];  Service: Obstetrics;  Laterality: Bilateral;   EXCISION OF SKIN TAG N/A 03/25/2022   Procedure: REMOVAL OF SKIN TAG;  Surgeon: Andria Meuse, MD;  Location: [facility];  Service: General;  Laterality: N/A;   RECTAL EXAM UNDER ANESTHESIA N/A 03/25/2022   Procedure: ANORECTAL EXAM UNDER ANESTHESIA;  Surgeon: Andria Meuse, MD;  Location: [facility];  Service: General;  Laterality: N/A;   SPHINCTEROTOMY N/A 03/25/2022   Procedure: CHEMICAL SPHINCTEROTOMY, BOTOX;  Surgeon: Andria Meuse, MD;  Location: [facility];  Service: General;  Laterality: N/A;   WISDOM TOOTH EXTRACTION      Family History  Adopted: Yes  Problem Relation Age of Onset   Drug abuse Mother    Hepatitis Mother    Other Father        hx unknown    Social History   Socioeconomic History   Marital status: Single    Spouse name: Not on file   Number of children: 2   Years of education: some colle   Highest education level: Associate degree: academic program  Occupational  History    Employer: [employer]    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    Some college, starting back in January.   [name], age 36.         Social Determinants of Health   Financial Resource Strain: Low Risk  (06/09/2022)   Overall Financial Resource Strain (CARDIA)    Difficulty of Paying Living Expenses: Not very hard  Food Insecurity: No Food Insecurity (06/09/2022)   Hunger Vital Sign    Worried About Running Out of Food in the Last Year: Never true    Ran Out of Food in the Last Year: Never true  Transportation Needs: No Transportation Needs (06/09/2022)   PRAPARE - Administrator, Civil Service (Medical): No    Lack of Transportation (Non-Medical): No  Physical Activity: Sufficiently Active (06/09/2022)   Exercise Vital Sign    Days of Exercise per Week: 3 days    Minutes of Exercise per Session: 60 min  Stress: No Stress Concern Present (06/09/2022)   [employer] of Occupational Health - Occupational Stress Questionnaire    Feeling of Stress : Not at all  Social Connections: Unknown (06/09/2022)   Social Connection and Isolation Panel [NHANES]    Frequency of Communication with Friends and Family: More than three times a week    Frequency of Social Gatherings with Friends and Family: Twice a week    Attends Religious Services: Patient declined    Database administrator or Organizations: No    Attends Engineer, structural: Not on file    Marital Status: Never married  Intimate Partner Violence: Unknown (05/20/2021)   Received from [employer], [facility]   HITS    Physically Hurt: Not on file    Insult or Talk Down To: Not on file    Threaten Physical Harm: Not on file    Scream or Curse: Not on file    Review of Systems  All other systems reviewed and are negative.       Objective    BP 128/82   Pulse 84   Temp 98.1 F (36.7 C) (Oral)   Resp 16   Wt 237 lb (107.5 kg)   SpO2 94%   BMI 43.70 kg/m   Physical Exam Vitals and nursing note reviewed.  Constitutional:      General: She is not in acute distress.    Appearance: She is obese.  HENT:     Head:  Normocephalic and atraumatic.  Cardiovascular:     Rate and Rhythm: Normal rate and regular rhythm.  Pulmonary:     Effort: Pulmonary effort is normal.     Breath sounds: Normal breath sounds.  Neurological:     General: No focal deficit present.     Mental Status: She is alert and oriented to person, place, and time.  Psychiatric:        Mood and Affect:  Mood normal.         Assessment & Plan:   Encounter for weight management  Class 3 severe obesity due to excess calories without serious comorbidity with body mass index (BMI) of 40.0 to 44.9 in adult [facility])  Other orders -     Phentermine HCl; Take 1 capsule (37.5 mg total) by mouth every morning.  Dispense: 30 capsule; Refill: 0     Return in about 4 weeks (around 11/30/2022) for follow up.   [name], MD

## 2022-11-30 ENCOUNTER — Ambulatory Visit: Payer: 59 | Admitting: Family Medicine

## 2023-01-05 ENCOUNTER — Ambulatory Visit: Payer: 59 | Admitting: Family Medicine

## 2023-02-22 ENCOUNTER — Ambulatory Visit: Payer: 59 | Admitting: Family Medicine

## 2023-04-13 ENCOUNTER — Telehealth: Payer: Self-pay | Admitting: *Deleted

## 2023-04-13 NOTE — Telephone Encounter (Signed)
 Please advise patient.

## 2024-04-18 ENCOUNTER — Encounter: Admitting: Family Medicine
